# Patient Record
Sex: Male | Born: 1975 | Race: Black or African American | Hispanic: No | Marital: Single | Smoking: Current every day smoker
Health system: Southern US, Community
[De-identification: ages and names within clinical notes are randomized; demographics above are authoritative.]

## PROBLEM LIST (undated history)

## (undated) DIAGNOSIS — T7840XA Allergy, unspecified, initial encounter: Secondary | ICD-10-CM

## (undated) DIAGNOSIS — K859 Acute pancreatitis without necrosis or infection, unspecified: Secondary | ICD-10-CM

## (undated) DIAGNOSIS — I10 Essential (primary) hypertension: Secondary | ICD-10-CM

## (undated) HISTORY — DX: Allergy, unspecified, initial encounter: T78.40XA

## (undated) HISTORY — PX: NASAL FRACTURE SURGERY: SHX718

## (undated) HISTORY — PX: HERNIA REPAIR: SHX51

## (undated) HISTORY — PX: TYMPANOSTOMY TUBE PLACEMENT: SHX32

---

## 2003-02-28 ENCOUNTER — Emergency Department (HOSPITAL_COMMUNITY): Admission: EM | Admit: 2003-02-28 | Discharge: 2003-02-28 | Payer: Self-pay | Admitting: *Deleted

## 2003-09-28 ENCOUNTER — Emergency Department (HOSPITAL_COMMUNITY): Admission: EM | Admit: 2003-09-28 | Discharge: 2003-09-28 | Payer: Self-pay

## 2005-02-13 ENCOUNTER — Emergency Department (HOSPITAL_COMMUNITY): Admission: EM | Admit: 2005-02-13 | Discharge: 2005-02-13 | Payer: Self-pay | Admitting: Emergency Medicine

## 2005-02-15 ENCOUNTER — Emergency Department (HOSPITAL_COMMUNITY): Admission: EM | Admit: 2005-02-15 | Discharge: 2005-02-15 | Payer: Self-pay | Admitting: Emergency Medicine

## 2005-03-06 ENCOUNTER — Emergency Department (HOSPITAL_COMMUNITY): Admission: EM | Admit: 2005-03-06 | Discharge: 2005-03-06 | Payer: Self-pay | Admitting: Emergency Medicine

## 2005-04-23 ENCOUNTER — Emergency Department (HOSPITAL_COMMUNITY): Admission: EM | Admit: 2005-04-23 | Discharge: 2005-04-23 | Payer: Self-pay | Admitting: Emergency Medicine

## 2007-12-24 ENCOUNTER — Emergency Department (HOSPITAL_COMMUNITY): Admission: EM | Admit: 2007-12-24 | Discharge: 2007-12-24 | Payer: Self-pay | Admitting: Emergency Medicine

## 2011-07-05 ENCOUNTER — Encounter (HOSPITAL_COMMUNITY): Payer: Self-pay | Admitting: *Deleted

## 2011-07-05 ENCOUNTER — Inpatient Hospital Stay (HOSPITAL_COMMUNITY): Payer: Medicaid Other

## 2011-07-05 ENCOUNTER — Inpatient Hospital Stay (HOSPITAL_COMMUNITY)
Admission: EM | Admit: 2011-07-05 | Discharge: 2011-07-10 | DRG: 896 | Disposition: A | Discharge status: Home / Self Care | Payer: Medicaid Other | Attending: Internal Medicine | Admitting: Internal Medicine

## 2011-07-05 ENCOUNTER — Emergency Department (HOSPITAL_COMMUNITY): Payer: Medicaid Other

## 2011-07-05 DIAGNOSIS — I10 Essential (primary) hypertension: Secondary | ICD-10-CM

## 2011-07-05 DIAGNOSIS — D72829 Elevated white blood cell count, unspecified: Secondary | ICD-10-CM

## 2011-07-05 DIAGNOSIS — F10231 Alcohol dependence with withdrawal delirium: Secondary | ICD-10-CM | POA: Diagnosis present

## 2011-07-05 DIAGNOSIS — E876 Hypokalemia: Secondary | ICD-10-CM

## 2011-07-05 DIAGNOSIS — E860 Dehydration: Secondary | ICD-10-CM

## 2011-07-05 DIAGNOSIS — F10931 Alcohol use, unspecified with withdrawal delirium: Secondary | ICD-10-CM

## 2011-07-05 DIAGNOSIS — R319 Hematuria, unspecified: Secondary | ICD-10-CM | POA: Diagnosis not present

## 2011-07-05 DIAGNOSIS — R1110 Vomiting, unspecified: Secondary | ICD-10-CM

## 2011-07-05 DIAGNOSIS — I951 Orthostatic hypotension: Secondary | ICD-10-CM | POA: Diagnosis not present

## 2011-07-05 DIAGNOSIS — K859 Acute pancreatitis without necrosis or infection, unspecified: Secondary | ICD-10-CM

## 2011-07-05 DIAGNOSIS — F10929 Alcohol use, unspecified with intoxication, unspecified: Secondary | ICD-10-CM

## 2011-07-05 DIAGNOSIS — F10229 Alcohol dependence with intoxication, unspecified: Principal | ICD-10-CM | POA: Diagnosis present

## 2011-07-05 DIAGNOSIS — R066 Hiccough: Secondary | ICD-10-CM | POA: Diagnosis not present

## 2011-07-05 LAB — DIFFERENTIAL
Basophils Relative: 0 % (ref 0–1)
Eosinophils Absolute: 0 10*3/uL (ref 0.0–0.7)
Eosinophils Relative: 0 % (ref 0–5)
Eosinophils Relative: 0 % (ref 0–5)
Lymphs Abs: 1 10*3/uL (ref 0.7–4.0)
Lymphs Abs: 0.9 10*3/uL (ref 0.7–4.0)
Monocytes Absolute: 1 10*3/uL (ref 0.1–1.0)
Monocytes Absolute: 1 10*3/uL (ref 0.1–1.0)
Monocytes Relative: 6 % (ref 3–12)
Neutro Abs: 15.5 10*3/uL — ABNORMAL HIGH (ref 1.7–7.7)
Neutrophils Relative %: 86 % — ABNORMAL HIGH (ref 43–77)

## 2011-07-05 LAB — URINE MICROSCOPIC-ADD ON

## 2011-07-05 LAB — COMPREHENSIVE METABOLIC PANEL
ALT: 21 U/L (ref 0–53)
AST: 36 U/L (ref 0–37)
Albumin: 2.9 g/dL — ABNORMAL LOW (ref 3.5–5.2)
Alkaline Phosphatase: 102 U/L (ref 39–117)
BUN: 8 mg/dL (ref 6–23)
CO2: 24 mEq/L (ref 19–32)
CO2: 22 mEq/L (ref 19–32)
Calcium: 9.2 mg/dL (ref 8.4–10.5)
Chloride: 103 mEq/L (ref 96–112)
Creatinine, Ser: 0.78 mg/dL (ref 0.50–1.35)
Creatinine, Ser: 0.66 mg/dL (ref 0.50–1.35)
GFR calc Af Amer: >90 mL/min (ref >90)
GFR calc non Af Amer: >90 mL/min (ref >90)
GFR calc non Af Amer: >90 mL/min (ref >90)
Glucose, Bld: 92 mg/dL (ref 70–99)
Potassium: 4.2 mEq/L (ref 3.5–5.1)
Sodium: 137 mEq/L (ref 135–145)
Total Bilirubin: 0.6 mg/dL (ref 0.3–1.2)
Total Protein: 7 g/dL (ref 6.0–8.3)

## 2011-07-05 LAB — URINALYSIS, ROUTINE W REFLEX MICROSCOPIC
Leukocytes, UA: NEGATIVE
Nitrite: NEGATIVE
Protein, ur: >300 mg/dL — AB
Specific Gravity, Urine: 1.028 (ref 1.005–1.030)
Urobilinogen, UA: 1 mg/dL (ref 0.0–1.0)

## 2011-07-05 LAB — CBC
HCT: 49.7 % (ref 39.0–52.0)
MCH: 36.5 pg — ABNORMAL HIGH (ref 26.0–34.0)
MCHC: 36.9 g/dL — ABNORMAL HIGH (ref 30.0–36.0)
MCV: 98.9 fL (ref 78.0–100.0)
MCV: 100 fL (ref 78.0–100.0)
Platelets: 266 10*3/uL (ref 150–400)
Platelets: 218 10*3/uL (ref 150–400)
RBC: 4.97 MIL/uL (ref 4.22–5.81)
RDW: 12.5 % (ref 11.5–15.5)
WBC: 17.4 10*3/uL — ABNORMAL HIGH (ref 4.0–10.5)

## 2011-07-05 LAB — CARDIAC PANEL(CRET KIN+CKTOT+MB+TROPI)
CK, MB: 2.5 ng/mL (ref 0.3–4.0)
Relative Index: 1.4 (ref 0.0–2.5)
Total CK: 179 U/L (ref 7–232)
Troponin I: <0.3 ng/mL (ref <0.30)

## 2011-07-05 LAB — PHOSPHORUS: Phosphorus: 3.5 mg/dL (ref 2.3–4.6)

## 2011-07-05 LAB — MRSA PCR SCREENING: MRSA by PCR: NEGATIVE

## 2011-07-05 LAB — PROTIME-INR: INR: 1.13 (ref 0.00–1.49)

## 2011-07-05 LAB — RAPID URINE DRUG SCREEN, HOSP PERFORMED: Barbiturates: NOT DETECTED

## 2011-07-05 MED ORDER — LABETALOL HCL 200 MG PO TABS
200.0000 mg | ORAL_TABLET | Freq: Two times a day (BID) | ORAL | Status: DC
  Administered 2011-07-06 – 2011-07-10 (×8): 200 mg via ORAL
  Filled 2011-07-05 (×11): qty 1

## 2011-07-05 MED ORDER — ENOXAPARIN SODIUM 30 MG/0.3ML ~~LOC~~ SOLN
30.0000 mg | SUBCUTANEOUS | Status: DC
  Administered 2011-07-05: 30 mg via SUBCUTANEOUS
  Filled 2011-07-05 (×2): qty 0.3

## 2011-07-05 MED ORDER — ADULT MULTIVITAMIN W/MINERALS CH
1.0000 | ORAL_TABLET | Freq: Every day | ORAL | Status: DC
  Administered 2011-07-07 – 2011-07-10 (×4): 1 via ORAL
  Filled 2011-07-05 (×6): qty 1

## 2011-07-05 MED ORDER — VITAMIN B-1 100 MG PO TABS
100.0000 mg | ORAL_TABLET | Freq: Every day | ORAL | Status: DC
  Administered 2011-07-07 – 2011-07-09 (×3): 100 mg via ORAL
  Filled 2011-07-05 (×6): qty 1

## 2011-07-05 MED ORDER — OXYCODONE HCL 5 MG PO TABS
5.0000 mg | ORAL_TABLET | ORAL | Status: DC | PRN
  Administered 2011-07-07 – 2011-07-10 (×10): 5 mg via ORAL
  Filled 2011-07-05 (×11): qty 1

## 2011-07-05 MED ORDER — ONDANSETRON HCL 4 MG/2ML IJ SOLN
4.0000 mg | INTRAMUSCULAR | Status: DC | PRN
  Filled 2011-07-05: qty 2

## 2011-07-05 MED ORDER — MORPHINE SULFATE 2 MG/ML IJ SOLN
1.0000 mg | INTRAMUSCULAR | Status: DC | PRN
  Administered 2011-07-06 – 2011-07-07 (×6): 1 mg via INTRAVENOUS
  Filled 2011-07-05 (×6): qty 1

## 2011-07-05 MED ORDER — SODIUM CHLORIDE 0.9 % IV SOLN
INTRAVENOUS | Status: DC
  Administered 2011-07-05 (×2): via INTRAVENOUS

## 2011-07-05 MED ORDER — SODIUM CHLORIDE 0.9 % IV SOLN
INTRAVENOUS | Status: DC
  Administered 2011-07-05: 21:00:00 via INTRAVENOUS
  Administered 2011-07-06 (×2): 1000 mL via INTRAVENOUS
  Administered 2011-07-07 – 2011-07-08 (×3): via INTRAVENOUS

## 2011-07-05 MED ORDER — LORAZEPAM 1 MG PO TABS
1.0000 mg | ORAL_TABLET | Freq: Four times a day (QID) | ORAL | Status: AC | PRN
  Administered 2011-07-08: 1 mg via ORAL
  Filled 2011-07-05: qty 1

## 2011-07-05 MED ORDER — MORPHINE SULFATE 4 MG/ML IJ SOLN
4.0000 mg | INTRAMUSCULAR | Status: AC | PRN
  Administered 2011-07-05 (×2): 4 mg via INTRAVENOUS
  Filled 2011-07-05 (×2): qty 1

## 2011-07-05 MED ORDER — FAMOTIDINE IN NACL 20-0.9 MG/50ML-% IV SOLN
20.0000 mg | Freq: Once | INTRAVENOUS | Status: AC
  Administered 2011-07-05: 20 mg via INTRAVENOUS
  Filled 2011-07-05: qty 50

## 2011-07-05 MED ORDER — SODIUM CHLORIDE 0.9 % IJ SOLN
3.0000 mL | Freq: Two times a day (BID) | INTRAMUSCULAR | Status: DC
  Administered 2011-07-07 – 2011-07-10 (×4): 3 mL via INTRAVENOUS

## 2011-07-05 MED ORDER — ONDANSETRON HCL 4 MG PO TABS: 4.0000 mg | ORAL_TABLET | Freq: Four times a day (QID) | ORAL | Status: DC | PRN

## 2011-07-05 MED ORDER — LORAZEPAM 2 MG/ML IJ SOLN
1.0000 mg | Freq: Four times a day (QID) | INTRAMUSCULAR | Status: AC | PRN
  Administered 2011-07-05 – 2011-07-07 (×5): 1 mg via INTRAVENOUS
  Filled 2011-07-05 (×6): qty 1

## 2011-07-05 MED ORDER — THIAMINE HCL 100 MG/ML IJ SOLN
100.0000 mg | Freq: Every day | INTRAMUSCULAR | Status: DC
  Administered 2011-07-05 – 2011-07-10 (×3): 100 mg via INTRAVENOUS
  Filled 2011-07-05 (×6): qty 1

## 2011-07-05 MED ORDER — ONDANSETRON HCL 4 MG/2ML IJ SOLN
4.0000 mg | INTRAMUSCULAR | Status: AC | PRN
  Administered 2011-07-05 (×2): 4 mg via INTRAVENOUS
  Filled 2011-07-05 (×2): qty 2

## 2011-07-05 MED ORDER — ONDANSETRON HCL 4 MG/2ML IJ SOLN
4.0000 mg | Freq: Four times a day (QID) | INTRAMUSCULAR | Status: DC | PRN
  Administered 2011-07-06: 4 mg via INTRAVENOUS

## 2011-07-05 MED ORDER — FOLIC ACID 1 MG PO TABS
1.0000 mg | ORAL_TABLET | Freq: Every day | ORAL | Status: DC
  Administered 2011-07-07 – 2011-07-10 (×4): 1 mg via ORAL
  Filled 2011-07-05 (×6): qty 1

## 2011-07-05 NOTE — ED Provider Notes (Signed)
History     CSN: [id number]  Arrival date & time 07/05/11  1525   First MD Initiated Contact with Patient 07/05/11 1536      Chief Complaint  Patient presents with  . Abdominal Pain    umbilical area  . Nausea    HPI Pt was seen at 1550.  Per pt, c/o gradual onset and worsening of persistent upper abd "pain" since yesterday.  Describes the pain as radiating into his back, has been associated with several intermittent episodes of N/V.  Endorses hx of daily etoh use.  Denies diarrhea, no fevers, no rash, no black or blood in stools or emesis, no CP/SOB.    History reviewed. No pertinent past medical history.  History reviewed. No pertinent past surgical history.   History  Substance Use Topics  . Smoking status: Current Everyday Smoker -- 1.0 packs/day    Types: Cigarettes  . Smokeless tobacco: Never Used  . Alcohol Use: 0.0 oz/week    12-24 Shots of liquor per week     daily    Review of Systems ROS: Statement: All systems negative except as marked or noted in the HPI; Constitutional: Negative for fever and chills. ; ; Eyes: Negative for eye pain, redness and discharge. ; ; ENMT: Negative for ear pain, hoarseness, nasal congestion, sinus pressure and sore throat. ; ; Cardiovascular: Negative for chest pain, palpitations, diaphoresis, dyspnea and peripheral edema. ; ; Respiratory: Negative for cough, wheezing and stridor. ; ; Gastrointestinal: +N/V, abd pain.  Negative for diarrhea, blood in stool, hematemesis, jaundice and rectal bleeding. . ; ; Genitourinary: Negative for dysuria, flank pain and hematuria. ; ; Musculoskeletal: Negative for back pain and neck pain. Negative for swelling and trauma.; ; Skin: Negative for pruritus, rash, abrasions, blisters, bruising and skin lesion.; ; Neuro: Negative for headache, lightheadedness and neck stiffness. Negative for weakness, altered level of consciousness , altered mental status, extremity weakness, paresthesias, involuntary movement,  seizure and syncope.      Allergies  Review of patient's allergies indicates no known allergies.  Home Medications  No current outpatient prescriptions on file.  BP 125/103  Pulse 106  Temp(Src) 98 F (36.7 C) (Oral)  Resp 18  Ht 5\' 9"  (1.753 m)  Wt 125 lb (56.7 kg)  BMI 18.46 kg/m2  SpO2 98%  Physical Exam 1555: Physical examination:  Nursing notes reviewed; Vital signs and O2 SAT reviewed;  Constitutional: Thin, Uncomfortable appearing; Head:  Normocephalic, atraumatic; Eyes: EOMI, PERRL, No scleral icterus; ENMT: Mouth and pharynx normal, Mucous membranes dry; Neck: Supple, Full range of motion, No lymphadenopathy; Cardiovascular: Regular rate and rhythm, No murmur, rub, or gallop; Respiratory: Breath sounds clear & equal bilaterally, No rales, rhonchi, wheezes, or rub, Normal respiratory effort/excursion; Chest: Nontender, Movement normal; Abdomen: Soft, +mid-epigastric area tender to palp, no rebound or guarding, Nondistended, Normal bowel sounds; Extremities: Pulses normal, No tenderness, No edema, No calf edema or asymmetry.; Neuro: AA&Ox3, Major CN grossly intact.  No gross focal motor or sensory deficits in extremities.; Skin: Color normal, Warm, Dry    ED Course  Procedures   MDM  MDM Reviewed: nursing note and vitals Interpretation: labs and x-ray   Results for orders placed during the hospital encounter of 07/05/11  CBC      Component Value Range   WBC 14.6 (*) 4.0 - 10.5 (K/uL)   RBC 5.34  4.22 - 5.81 (MIL/uL)   Hemoglobin 19.5 (*) 13.0 - 17.0 (g/dL)   HCT 40.9 (*) 81.1 - 52.0 (%)  MCV 98.9  78.0 - 100.0 (fL)   MCH 36.5 (*) 26.0 - 34.0 (pg)   MCHC 36.9 (*) 30.0 - 36.0 (g/dL)   RDW 16.1  09.6 - 04.5 (%)   Platelets 266  150 - 400 (K/uL)  DIFFERENTIAL      Component Value Range   Neutrophils Relative 86 (*) 43 - 77 (%)   Lymphocytes Relative 7 (*) 12 - 46 (%)   Monocytes Relative 7  3 - 12 (%)   Eosinophils Relative 0  0 - 5 (%)   Basophils Relative 0   0 - 1 (%)   Neutro Abs 12.6 (*) 1.7 - 7.7 (K/uL)   Lymphs Abs 1.0  0.7 - 4.0 (K/uL)   Monocytes Absolute 1.0  0.1 - 1.0 (K/uL)   Eosinophils Absolute 0.0  0.0 - 0.7 (K/uL)   Basophils Absolute 0.0  0.0 - 0.1 (K/uL)   Smear Review MORPHOLOGY UNREMARKABLE    COMPREHENSIVE METABOLIC PANEL      Component Value Range   Sodium 137  135 - 145 (mEq/L)   Potassium 3.8  3.5 - 5.1 (mEq/L)   Chloride 99  96 - 112 (mEq/L)   CO2 24  19 - 32 (mEq/L)   Glucose, Bld 92  70 - 99 (mg/dL)   BUN 7  6 - 23 (mg/dL)   Creatinine, Ser 4.09  0.50 - 1.35 (mg/dL)   Calcium 9.2  8.4 - 81.1 (mg/dL)   Total Protein 7.0  6.0 - 8.3 (g/dL)   Albumin 3.3 (*) 3.5 - 5.2 (g/dL)   AST 32  0 - 37 (U/L)   ALT 21  0 - 53 (U/L)   Alkaline Phosphatase 110  39 - 117 (U/L)   Total Bilirubin 0.6  0.3 - 1.2 (mg/dL)   GFR calc non Af Amer >90  >90 (mL/min)   GFR calc Af Amer >90  >90 (mL/min)  LIPASE, BLOOD      Component Value Range   Lipase 2954 (*) 11 - 59 (U/L)  URINALYSIS, ROUTINE W REFLEX MICROSCOPIC      Component Value Range   Color, Urine AMBER (*) YELLOW    APPearance CLOUDY (*) CLEAR    Specific Gravity, Urine 1.028  1.005 - 1.030    pH 6.0  5.0 - 8.0    Glucose, UA NEGATIVE  NEGATIVE (mg/dL)   Hgb urine dipstick SMALL (*) NEGATIVE    Bilirubin Urine NEGATIVE  NEGATIVE    Ketones, ur 15 (*) NEGATIVE (mg/dL)   Protein, ur >914 (*) NEGATIVE (mg/dL)   Urobilinogen, UA 1.0  0.0 - 1.0 (mg/dL)   Nitrite NEGATIVE  NEGATIVE    Leukocytes, UA NEGATIVE  NEGATIVE   ETHANOL      Component Value Range   Alcohol, Ethyl (B) 103 (*) 0 - 11 (mg/dL)  URINE MICROSCOPIC-ADD ON      Component Value Range   Squamous Epithelial / LPF RARE  RARE    WBC, UA 0-2  <3 (WBC/hpf)   RBC / HPF 0-2  <3 (RBC/hpf)   Bacteria, UA RARE  RARE    Casts HYALINE CASTS (*) NEGATIVE    Sperm, UA PRESENT     Urine-Other MUCOUS PRESENT     Dg Abd Acute W/chest 07/05/2011  *RADIOLOGY REPORT*  Clinical Data: Abdominal pain, nausea/vomiting,  cough  ACUTE ABDOMEN SERIES (ABDOMEN 2 VIEW & CHEST 1 VIEW)  Comparison: None.  Findings: Lungs are clear. No pleural effusion or pneumothorax.  Cardiomediastinal silhouette is within normal limits.  Nonobstructive bowel gas pattern.  No evidence of free air under the diaphragm on the upright view.  Visualized osseous structures are within normal limits.  IMPRESSION: No evidence of acute cardiopulmonary disease.  No evidence of small bowel obstruction or free air.  Original Report Authenticated By: Charline Bills, M.D.      [phone]:  Pt with pancreatitis, likely from daily etoh use.  Does endorse that he "gets shaky" if he does not drink etoh.  Does not appear to have tremor or in withdrawal currently.  Requesting another dose of pain and nausea meds.  Dx testing d/w pt.  Questions answered.  Verb understanding, agreeable to admit.  T/C to [facility] Dr. Elisabeth Pigeon, case discussed, including:  HPI, pertinent PM/SHx, VS/PE, dx testing, ED course and treatment:  Agreeable to admit, requests to obtain step down bed to team 1.        Laray Anger, DO 07/07/11 1258

## 2011-07-05 NOTE — Progress Notes (Signed)
Received report from ED RN and accepted pt to ICU 1224. CHG bath given in ICU. C/O nausea, received zofran iv and morphine iv at 1820 per report. [facility]Link notified and report given to night RN.

## 2011-07-05 NOTE — H&P (Signed)
PCP:  Provider Not In System   DOA:  07/05/2011  3:25 PM  Chief Complaint:  Alcohol intoxication  HPI: 36 year old male with history of alcohol abuse who presented to ED with complaints of sever abdominal pain in epigastric and periumbilical region, 8-9/10 in intensity, radiating to back, constant. Patient reports associated nausea and vomiting, non bloody. No complaints of constipation or diarrhea, no blood in stool or urine. No complaints of shortness of breath, no chest pain, no fever or chills at home. No dizziness or lightheadedness.  Assessment/Plan  Principal Problem:   *Acute alcohol intoxication - start CIWA protocol - continue IV fluids - keep NPO - check UDS - monitor for signs of withdrawal  Active Problems:   Acute pancreatitis - secondary to alcohol - obtain CT abdome/pelvis with contrast - zofran PRN for nausea and/or vomiting - keep NPO - continue IV fluids NS at 125 cc /hr - analgesia with morphine IV PRN - trend lipase level   Leukocytosis - likely secondary to pancreatitis - I will not initiate any antibiotics as patient is afebrile and only a mild leukocytosis - urinalysis although cloudy in appearance it does not have any leukocytes or bacteria identified - follow up CBC in am - follow up blood culture results - follow up urine culture results   HTN (hypertension) - we will start labetalol 200 mg BID PO - follow up UDS result - telemetry monitoring  DVT Prophylaxis - lovenox sub Q  Code Status - full code  Education  - test results and diagnostic studies were discussed with patient - questions were answered at the bedside and contact information was provided for additional questions or concerns   Allergies: No Known Allergies  Prior to Admission medications   Not on File    History reviewed. No pertinent past medical history. History reviewed. No pertinent past surgical history.  Social History: Reports that he has been smoking  Cigarettes.  He has been smoking about 1 pack per day. He has never used smokeless tobacco. He reports that he drinks alcohol. He reports that he does not use illicit drugs. History reviewed. No pertinent family history.  Review of Systems:  Constitutional: Denies fever, chills, diaphoresis, appetite change and fatigue.  HEENT: Denies photophobia, eye pain, redness, hearing loss, ear pain, congestion, sore throat, rhinorrhea, sneezing, mouth sores, trouble swallowing, neck pain, neck stiffness and tinnitus.   Respiratory: Denies SOB, DOE, cough, chest tightness,  and wheezing.   Cardiovascular: Denies chest pain, palpitations and leg swelling.  Gastrointestinal: Denies nausea, vomiting, abdominal pain, diarrhea, constipation, blood in stool and abdominal distention.  Genitourinary: Denies dysuria, urgency, frequency, hematuria, flank pain and difficulty urinating.  Musculoskeletal: Denies myalgias, back pain, joint swelling, arthralgias and gait problem.  Skin: Denies pallor, rash and wound.  Neurological: Denies dizziness, seizures, syncope, weakness, light-headedness, numbness and headaches.  Hematological: Denies adenopathy. Easy bruising, personal or family bleeding history  Psychiatric/Behavioral: Denies suicidal ideation, mood changes, confusion, nervousness, sleep disturbance and agitation  Physical Exam:  Filed Vitals:   07/05/11 1527 07/05/11 1532  BP: 125/103   Pulse: 106   Temp: 98 F (36.7 C)   TempSrc: Oral   Resp: 18   Height: 5\' 9"  (1.753 m)   Weight: 56.7 kg (125 lb) 56.7 kg (125 lb)  SpO2: 98%     Constitutional: Vital signs reviewed.  Patient is in no acute distress and cooperative with exam.  Head: Normocephalic and atraumatic Ear: TM normal bilaterally Mouth: no erythema or  exudates, MMM Eyes: PERRL, EOMI, conjunctivae normal, No scleral icterus.  Neck: Supple, Trachea midline normal ROM, No JVD, mass, thyromegaly, or carotid bruit present.  Cardiovascular:  RRR, S1 normal, S2 normal, no MRG, pulses symmetric and intact bilaterally Pulmonary/Chest: CTAB, no wheezes, rales, or rhonchi Abdominal: Soft. Non-tender, non-distended, bowel sounds are normal, no masses, organomegaly, or guarding present.  GU: no CVA tenderness Musculoskeletal: No joint deformities, erythema, or stiffness, ROM full and no nontender Ext: no edema and no cyanosis, pulses palpable bilaterally (DP and PT) Hematology: no cervical, inginal, or axillary adenopathy.  Neurological: A&O x3, Strenght is normal and symmetric bilaterally, cranial nerve II-XII are grossly intact, no focal motor deficit, sensory intact to light touch bilaterally.  Skin: Warm, dry and intact. No rash, cyanosis, or clubbing.  Psychiatric: Normal mood and affect. speech and behavior is normal.  Cognition and memory are normal.   Labs on Admission:  URINALYSIS, ROUTINE W REFLEX MICROSCOPIC     Status: Abnormal   Collection Time   07/05/11  3:55 PM      Component Value Range Comment   Color, Urine AMBER (*) YELLOW     APPearance CLOUDY (*) CLEAR     Specific Gravity, Urine 1.028  1.005 - 1.030     pH 6.0  5.0 - 8.0     Glucose, UA NEGATIVE  NEGATIVE (mg/dL)    Hgb urine dipstick SMALL (*) NEGATIVE     Bilirubin Urine NEGATIVE  NEGATIVE     Ketones, ur 15 (*) NEGATIVE (mg/dL)    Protein, ur >161 (*) NEGATIVE (mg/dL)    Urobilinogen, UA 1.0  0.0 - 1.0 (mg/dL)    Nitrite NEGATIVE  NEGATIVE     Leukocytes, UA NEGATIVE  NEGATIVE    URINE MICROSCOPIC-ADD ON     Status: Abnormal   Collection Time   07/05/11  3:55 PM      Component Value Range Comment   Squamous Epithelial / LPF RARE  RARE     WBC, UA 0-2  <3 (WBC/hpf)    RBC / HPF 0-2  <3 (RBC/hpf)    Bacteria, UA RARE  RARE     Casts HYALINE CASTS (*) NEGATIVE     Sperm, UA PRESENT      Urine-Other MUCOUS PRESENT     CBC     Status: Abnormal   Collection Time   07/05/11  4:09 PM      Component Value Range Comment   WBC 14.6 (*) 4.0 - 10.5 (K/uL)      RBC 5.34  4.22 - 5.81 (MIL/uL)    Hemoglobin 19.5 (*) 13.0 - 17.0 (g/dL)    HCT 09.6 (*) 04.5 - 52.0 (%)    MCV 98.9  78.0 - 100.0 (fL)    MCH 36.5 (*) 26.0 - 34.0 (pg)    MCHC 36.9 (*) 30.0 - 36.0 (g/dL)    RDW 40.9  81.1 - 91.4 (%)    Platelets 266  150 - 400 (K/uL)   COMPREHENSIVE METABOLIC PANEL     Status: Abnormal   Collection Time   07/05/11  4:09 PM      Component Value Range Comment   Sodium 137  135 - 145 (mEq/L)    Potassium 3.8  3.5 - 5.1 (mEq/L)    Chloride 99  96 - 112 (mEq/L)    CO2 24  19 - 32 (mEq/L)    Glucose, Bld 92  70 - 99 (mg/dL)    BUN 7  6 - 23 (  mg/dL)    Creatinine, Ser 1.02  0.50 - 1.35 (mg/dL)    Calcium 9.2  8.4 - 10.5 (mg/dL)    Total Protein 7.0  6.0 - 8.3 (g/dL)    Albumin 3.3 (*) 3.5 - 5.2 (g/dL)    AST 32  0 - 37 (U/L)    ALT 21  0 - 53 (U/L)    Alkaline Phosphatase 110  39 - 117 (U/L)    Total Bilirubin 0.6  0.3 - 1.2 (mg/dL)    GFR calc non Af Amer >90  >90 (mL/min)    GFR calc Af Amer >90  >90 (mL/min)   LIPASE, BLOOD     Status: Abnormal   Collection Time   07/05/11  4:09 PM      Component Value Range Comment   Lipase 2954 (*) 11 - 59 (U/L)   ETHANOL     Status: Abnormal   Collection Time   07/05/11  4:09 PM      Component Value Range Comment   Alcohol, Ethyl (B) 103 (*) 0 - 11 (mg/dL)     Radiological Exams on Admission: No results found. Time Spent on Admission: Over 30 minutes  Timouthy Gilardi 07/05/2011, 5:46 PM  [facility] Pager # [phone] Main Office # [phone]

## 2011-07-05 NOTE — ED Notes (Signed)
Pt denies any symptoms of withdraws.  Pt alert and active with care

## 2011-07-05 NOTE — ED Notes (Signed)
Updated EDP of pt's CIWA score (6), no new orders

## 2011-07-05 NOTE — ED Notes (Signed)
Pt from home with reports of abdominal pain around the umbilical area that radiates to back as well as nausea. Pt denies injury, vomiting or fever.

## 2011-07-06 ENCOUNTER — Inpatient Hospital Stay (HOSPITAL_COMMUNITY): Payer: Medicaid Other

## 2011-07-06 DIAGNOSIS — R1110 Vomiting, unspecified: Secondary | ICD-10-CM | POA: Diagnosis present

## 2011-07-06 DIAGNOSIS — E860 Dehydration: Secondary | ICD-10-CM | POA: Diagnosis present

## 2011-07-06 DIAGNOSIS — F10231 Alcohol dependence with withdrawal delirium: Secondary | ICD-10-CM | POA: Diagnosis present

## 2011-07-06 LAB — COMPREHENSIVE METABOLIC PANEL
ALT: 19 U/L (ref 0–53)
AST: 52 U/L — ABNORMAL HIGH (ref 0–37)
Alkaline Phosphatase: 103 U/L (ref 39–117)
CO2: 26 mEq/L (ref 19–32)
Calcium: 9 mg/dL (ref 8.4–10.5)
Chloride: 102 mEq/L (ref 96–112)
GFR calc non Af Amer: >90 mL/min (ref >90)
Glucose, Bld: 117 mg/dL — ABNORMAL HIGH (ref 70–99)
Potassium: 4.4 mEq/L (ref 3.5–5.1)
Sodium: 139 mEq/L (ref 135–145)

## 2011-07-06 LAB — CARDIAC PANEL(CRET KIN+CKTOT+MB+TROPI)
Relative Index: 1.1 (ref 0.0–2.5)
Total CK: 253 U/L — ABNORMAL HIGH (ref 7–232)
Troponin I: <0.3 ng/mL (ref <0.30)

## 2011-07-06 LAB — CBC
Hemoglobin: 18.3 g/dL — ABNORMAL HIGH (ref 13.0–17.0)
MCH: 36.4 pg — ABNORMAL HIGH (ref 26.0–34.0)
MCHC: 36 g/dL (ref 30.0–36.0)
RDW: 12.6 % (ref 11.5–15.5)

## 2011-07-06 LAB — HEMOGLOBIN A1C: Mean Plasma Glucose: 105 mg/dL (ref <117)

## 2011-07-06 LAB — URINE CULTURE

## 2011-07-06 LAB — TSH: TSH: 1.12 u[IU]/mL (ref 0.350–4.500)

## 2011-07-06 LAB — GLUCOSE, CAPILLARY

## 2011-07-06 LAB — LIPASE, BLOOD: Lipase: 2693 U/L — ABNORMAL HIGH (ref 11–59)

## 2011-07-06 MED ORDER — ONDANSETRON HCL 4 MG/2ML IJ SOLN
4.0000 mg | INTRAMUSCULAR | Status: DC | PRN
  Administered 2011-07-06 – 2011-07-10 (×8): 4 mg via INTRAVENOUS
  Filled 2011-07-06 (×8): qty 2

## 2011-07-06 MED ORDER — SODIUM CHLORIDE 0.9 % IV SOLN
500.0000 mg | Freq: Three times a day (TID) | INTRAVENOUS | Status: DC
  Administered 2011-07-06 – 2011-07-07 (×4): 500 mg via INTRAVENOUS
  Filled 2011-07-06 (×7): qty 500

## 2011-07-06 MED ORDER — PROMETHAZINE HCL 25 MG/ML IJ SOLN
12.5000 mg | Freq: Four times a day (QID) | INTRAMUSCULAR | Status: DC | PRN
  Administered 2011-07-06: 12.5 mg via INTRAVENOUS
  Filled 2011-07-06: qty 1

## 2011-07-06 MED ORDER — HYDRALAZINE HCL 20 MG/ML IJ SOLN
5.0000 mg | Freq: Three times a day (TID) | INTRAMUSCULAR | Status: DC | PRN
  Administered 2011-07-06: 5 mg via INTRAVENOUS
  Filled 2011-07-06: qty 1

## 2011-07-06 MED ORDER — ENOXAPARIN SODIUM 40 MG/0.4ML ~~LOC~~ SOLN
40.0000 mg | SUBCUTANEOUS | Status: DC
  Administered 2011-07-06 – 2011-07-09 (×4): 40 mg via SUBCUTANEOUS
  Filled 2011-07-06 (×5): qty 0.4

## 2011-07-06 MED ORDER — IOHEXOL 300 MG/ML  SOLN
80.0000 mL | Freq: Once | INTRAMUSCULAR | Status: AC | PRN
  Administered 2011-07-06: 80 mL via INTRAVENOUS

## 2011-07-06 MED ORDER — ONDANSETRON HCL 4 MG PO TABS: 4.0000 mg | ORAL_TABLET | ORAL | Status: DC | PRN

## 2011-07-06 MED ORDER — VITAMINS A & D EX OINT
TOPICAL_OINTMENT | CUTANEOUS | Status: AC
  Administered 2011-07-06: 13:00:00
  Filled 2011-07-06: qty 5

## 2011-07-06 NOTE — Progress Notes (Signed)
Subjective: Patient is fully awake and alert. He states that his pain is diffuse in his abdomen and he has no real significant epigastric pain. The patient however is unable to tolerate any oral intake and continues to have nausea and vomiting. The patient endorses that he drinks approximately 1 pint of liquor daily his last drink was yesterday but he drank one cup of liquor. Objective: Filed Vitals:   07/06/11 0300 07/06/11 0400 07/06/11 0700 07/06/11 0800  BP: 156/101 146/117 177/117 163/98  Pulse: 73 84 78 77  Temp:      TempSrc:      Resp: 23 23 19 19   Height:      Weight:      SpO2: 98% 96% 97% 97%   Weight change:   Intake/Output Summary (Last 24 hours) at 07/06/11 0828 Last data filed at 07/06/11 0800  Gross per 24 hour  Intake   2590 ml  Output     60 ml  Net   2530 ml    General: Alert, awake, oriented x3, in no acute distress. He is very thin appearing. However he has very logical thought process and does not appear agitated in presentation HEENT: [place]/AT PEERL, EOMI Neck: Trachea midline,  no masses, no thyromegal,y no JVD, no carotid bruit OROPHARYNX:  Moist, No exudate/ erythema/lesions.  Heart: Regular rate and rhythm, without murmurs, rubs, gallops, PMI non-displaced, no heaves or thrills on palpation.  Lungs: Clear to auscultation, no wheezing or rhonchi noted. No increased vocal fremitus resonant to percussion  Abdomen: Soft, mild diffuse tenderness, nondistended, positive bowel sounds, no masses no hepatosplenomegaly noted. Neuro: No focal neurological deficits noted cranial nerves II through XII grossly intact. DTRs 2+ bilaterally upper and lower extremities. Strength functional in bilateral upper and lower extremities. Musculoskeletal: No warm swelling or erythema around joints, no spinal tenderness noted. Clubbing of fingernails noted bilateral Psychiatric: Patient alert and oriented x3, good insight and cognition, good recent to remote recall. Patient is very  logical and ordered in his thought process      Lab Results:  [facility] 07/06/11 0407 07/05/11 2030  NA 139 138  K 4.4 4.2  CL 102 103  CO2 26 22  GLUCOSE 117* 106*  BUN 12 8  CREATININE 0.74 0.66  CALCIUM 9.0 8.6  MG -- 1.5  PHOS -- 3.5    Basename 07/06/11 0407 07/05/11 2030  AST 52* 36  ALT 19 19  ALKPHOS 103 102  BILITOT 0.8 0.6  PROT 6.6 6.3  ALBUMIN 2.9* 2.9*    Basename 07/06/11 0407 07/05/11 1609  LIPASE 2693* 2954*  AMYLASE -- --    Basename 07/06/11 0407 07/05/11 2030 07/05/11 1609  WBC 17.6* 17.4* --  NEUTROABS -- 15.5* 12.6*  HGB 18.3* 18.1* --  HCT 50.9 49.7 --  MCV 101.2* 100.0 --  PLT 205 218 --    Basename 07/06/11 0740 07/05/11 2030  CKTOTAL 178 179  CKMB 1.8 2.5  CKMBINDEX -- --  TROPONINI <0.30 <0.30   No components found with this basename: POCBNP:3 No results found for this basename: DDIMER:2 in the last 72 hours  Basename 07/05/11 2030  HGBA1C 5.3   No results found for this basename: CHOL:2,HDL:2,LDLCALC:2,TRIG:2,CHOLHDL:2,LDLDIRECT:2 in the last 72 hours  Basename 07/05/11 2030  TSH 1.120  T4TOTAL --  T3FREE --  THYROIDAB --   No results found for this basename: VITAMINB12:2,FOLATE:2,FERRITIN:2,TIBC:2,IRON:2,RETICCTPCT:2 in the last 72 hours  Micro Results: Recent Results (from the past 240 hour(s))  MRSA PCR SCREENING  Status: Normal   Collection Time   07/05/11 10:27 PM      Component Value Range Status Comment   MRSA by PCR NEGATIVE  NEGATIVE  Final     Studies/Results: Dg Abd Acute W/chest  07/05/2011  *RADIOLOGY REPORT*  Clinical Data: Abdominal pain, nausea/vomiting, cough  ACUTE ABDOMEN SERIES (ABDOMEN 2 VIEW & CHEST 1 VIEW)  Comparison: None.  Findings: Lungs are clear. No pleural effusion or pneumothorax.  Cardiomediastinal silhouette is within normal limits.  Nonobstructive bowel gas pattern.  No evidence of free air under the diaphragm on the upright view.  Visualized osseous structures are within normal  limits.  IMPRESSION: No evidence of acute cardiopulmonary disease.  No evidence of small bowel obstruction or free air.  Original Report Authenticated By: Charline Bills, M.D.    Medications: I have reviewed the patient's current medications. Scheduled Meds:   . enoxaparin  30 mg Subcutaneous Q24H  . famotidine  20 mg Intravenous Once  . folic acid  1 mg Oral Daily  . labetalol  200 mg Oral BID  . mulitivitamin with minerals  1 tablet Oral Daily  . sodium chloride  3 mL Intravenous Q12H  . thiamine  100 mg Oral Daily   Or  . thiamine  100 mg Intravenous Daily   Continuous Infusions:   . sodium chloride 125 mL/hr at 07/05/11 1817  . sodium chloride 1,000 mL (07/06/11 0643)   PRN Meds:.LORazepam, LORazepam, morphine, morphine injection, ondansetron, ondansetron (ZOFRAN) IV, ondansetron, oxyCODONE, DISCONTD: ondansetron (ZOFRAN) IV, DISCONTD: ondansetron, DISCONTD: ondansetron Assessment/Plan: Patient Active Hospital Problem List: Acute alcohol intoxication (07/05/2011)   Assessment: The patient admits to alcohol ingestion. Presently is not intoxicated and has not shown signs of withdrawal. I suspect the patient probably had gastritis associated with his alcohol use.   Plan: Continue to observe the patient for alcohol withdrawal.  Leukocytosis (07/05/2011)   Assessment: In light of his markedly elevated lipase and WBC's. I will pro phylactically begin antibiotics on this patient until he is able to obtain CT of abdomen to further assess severity of his disease given the high mortality associated with infection in acute panceatitis. Caveat is that patient would be at risk for resistant organisms and fungal disease with prolonged antibiotics in undocumented infection.    HTN (hypertension) (07/05/2011)   Assessment: We'll treat with IV agents until the patient is able to tolerate oral intake    Acute pancreatitis (07/05/2011)   Assessment: The patient's lipase supports the diagnosis of  acute pancreatitis. We'll continue with bowel rest and IV support. The patient is awaiting CT of the abdomen to assess however based on the patient's clinical presentation I doubt that the patient has a pseudocyst.    Alcohol withdrawal delirium (07/06/2011)   Assessment: The patient is on the alcohol withdrawal protocol however presently is not showing any signs of active withdrawal delirium.   Vomiting (07/06/2011)   Assessment: The patient has exceeded his Zofran for control of his nausea and vomiting I will add a dose of Phenergan and observe. Given the patient's risk of going into alcohol withdrawal and be very cautious with the use of Phenergan which is also respiratory depressants.    Dehydration (07/06/2011)   Assessment: Continue IV fluid support    Hematuria (07/06/2011)   Assessment: The patient has some aching of his urine in the Foley catheter. This is likely secondary to Foley trauma. Avastin is to flush the catheter and to report any further hematuria. Presently the patient's hemoglobin is  stable.   LOS: 1 day

## 2011-07-07 LAB — COMPREHENSIVE METABOLIC PANEL
AST: 59 U/L — ABNORMAL HIGH (ref 0–37)
Albumin: 2.2 g/dL — ABNORMAL LOW (ref 3.5–5.2)
Alkaline Phosphatase: 88 U/L (ref 39–117)
BUN: 19 mg/dL (ref 6–23)
CO2: 26 mEq/L (ref 19–32)
Chloride: 105 mEq/L (ref 96–112)
Creatinine, Ser: 0.8 mg/dL (ref 0.50–1.35)
GFR calc non Af Amer: >90 mL/min (ref >90)
Potassium: 3.6 mEq/L (ref 3.5–5.1)
Total Bilirubin: 0.7 mg/dL (ref 0.3–1.2)

## 2011-07-07 LAB — LIPASE, BLOOD: Lipase: 762 U/L — ABNORMAL HIGH (ref 11–59)

## 2011-07-07 MED ORDER — PNEUMOCOCCAL VAC POLYVALENT 25 MCG/0.5ML IJ INJ
0.5000 mL | INJECTION | Freq: Once | INTRAMUSCULAR | Status: DC
  Filled 2011-07-07: qty 0.5

## 2011-07-07 NOTE — Progress Notes (Signed)
CSW met with patient. Patient is alert and oriented x3. Patient states that he drinks daily. He reports that he drinks a bottle of liquor a day. Discussed resources and needs. Patient open to receiving information on outpatient treatment and AA. States that he has never been involved in outpatient treatment before. Patient states that he has applied for unemployment but hasnt heard back yet. CSW discussed applying for medicaid for patient. Patient received information on that. CSW provided resources above. CSW to continue to follow for needs.  Kissy Cielo C. Jamila Slatten MSW, LCSW [phone]

## 2011-07-07 NOTE — Progress Notes (Signed)
   CARE MANAGEMENT NOTE 07/07/2011  Patient:  Zachary Harper, Zachary Harper   Account Number:  [id number]  Date Initiated:  07/07/2011  Documentation initiated by:  Lanier Clam  Subjective/Objective Assessment:   ADMITTED W/ETOH INTOXICATION.     Action/Plan:   FROM HOME   Anticipated DC Date:  07/10/2011   Anticipated DC Plan:  HOME/SELF CARE  In-house referral  Clinical Social Worker         Choice offered to / List presented to:             Status of service:  In process, will continue to follow Medicare Important Message given?   (If response is "NO", the following Medicare IM given date fields will be blank) Date Medicare IM given:   Date Additional Medicare IM given:    Discharge Disposition:    Per UR Regulation:  Reviewed for med. necessity/level of care/duration of stay  If discussed at Long Length of Stay Meetings, dates discussed:    Comments:  07/07/11 [facility] RN,BSN [phone]

## 2011-07-07 NOTE — Progress Notes (Signed)
Subjective: Patient nontoxic-appearing today. He states that he has no further nausea or vomiting. The patient has been tolerating liquids without any difficulty today. He states that his abdominal pain is about a 3/10.  Interval history: CT of the abdomen shows no evidence of pseudocyst or pancreatic necrosis.   Objective: Filed Vitals:   07/07/11 0002 07/07/11 0635 07/07/11 1100 07/07/11 1827  BP: 141/87 123/86 126/84 124/79  Pulse: 82 86 77 96  Temp: 99.4 F (37.4 C) 99.6 F (37.6 C) 98.7 F (37.1 C) 98.8 F (37.1 C)  TempSrc: Oral Oral Oral Oral  Resp: 18 18 17 18   Height:      Weight:  52.5 kg (115 lb 11.9 oz)    SpO2: 97% 99% 97% 100%   Weight change: -4.3 kg (-9 lb 7.7 oz)  Intake/Output Summary (Last 24 hours) at 07/07/11 1944 Last data filed at 07/07/11 1559  Gross per 24 hour  Intake   2340 ml  Output    625 ml  Net   1715 ml    General: Alert, awake, oriented x3, in no acute distress.  HEENT: [place]/AT PEERL, EOMI Neck: Trachea midline,  no masses, no thyromegal,y no JVD, no carotid bruit OROPHARYNX:  Moist, No exudate/ erythema/lesions.  Heart: Regular rate and rhythm, without murmurs, rubs, gallops, PMI non-displaced, no heaves or thrills on palpation.  Lungs: Clear to auscultation, no wheezing or rhonchi noted. No increased vocal fremitus resonant to percussion  Abdomen: Soft, mild diffuse tenderness, nondistended, positive bowel sounds, no masses no hepatosplenomegaly noted.     Lab Results:  Basename 07/07/11 0444 07/06/11 0407 07/05/11 2030  NA 139 139 --  K 3.6 4.4 --  CL 105 102 --  CO2 26 26 --  GLUCOSE 94 117* --  BUN 19 12 --  CREATININE 0.80 0.74 --  CALCIUM 8.8 9.0 --  MG -- -- 1.5  PHOS -- -- 3.5    Basename 07/07/11 0444 07/06/11 0407  AST 59* 52*  ALT 15 19  ALKPHOS 88 103  BILITOT 0.7 0.8  PROT 5.6* 6.6  ALBUMIN 2.2* 2.9*    Basename 07/07/11 0444 07/06/11 0407  LIPASE 762* 2693*  AMYLASE -- --    Basename 07/06/11 0407  07/05/11 2030 07/05/11 1609  WBC 17.6* 17.4* --  NEUTROABS -- 15.5* 12.6*  HGB 18.3* 18.1* --  HCT 50.9 49.7 --  MCV 101.2* 100.0 --  PLT 205 218 --    Basename 07/06/11 1110 07/06/11 0740 07/05/11 2030  CKTOTAL 253* 178 179  CKMB 2.7 1.8 2.5  CKMBINDEX -- -- --  TROPONINI <0.30 <0.30 <0.30   No components found with this basename: POCBNP:3 No results found for this basename: DDIMER:2 in the last 72 hours  Basename 07/05/11 2030  HGBA1C 5.3   No results found for this basename: CHOL:2,HDL:2,LDLCALC:2,TRIG:2,CHOLHDL:2,LDLDIRECT:2 in the last 72 hours  Basename 07/05/11 2030  TSH 1.120  T4TOTAL --  T3FREE --  THYROIDAB --   No results found for this basename: VITAMINB12:2,FOLATE:2,FERRITIN:2,TIBC:2,IRON:2,RETICCTPCT:2 in the last 72 hours  Micro Results: Recent Results (from the past 240 hour(s))  URINE CULTURE     Status: Normal   Collection Time   07/05/11  3:55 PM      Component Value Range Status Comment   Specimen Description URINE, CLEAN CATCH   Final    Special Requests NONE   Final    Culture  Setup Time [id number]   Final    Colony Count 20,OOO COLONIES/ML   Final  Culture     Final    Value: Multiple bacterial morphotypes present, none predominant. Suggest appropriate recollection if clinically indicated.   Report Status 07/06/2011 FINAL   Final   MRSA PCR SCREENING     Status: Normal   Collection Time   07/05/11 10:27 PM      Component Value Range Status Comment   MRSA by PCR NEGATIVE  NEGATIVE  Final     Studies/Results: Dg Abd Acute W/chest  07/05/2011  *RADIOLOGY REPORT*  Clinical Data: Abdominal pain, nausea/vomiting, cough  ACUTE ABDOMEN SERIES (ABDOMEN 2 VIEW & CHEST 1 VIEW)  Comparison: None.  Findings: Lungs are clear. No pleural effusion or pneumothorax.  Cardiomediastinal silhouette is within normal limits.  Nonobstructive bowel gas pattern.  No evidence of free air under the diaphragm on the upright view.  Visualized osseous structures are  within normal limits.  IMPRESSION: No evidence of acute cardiopulmonary disease.  No evidence of small bowel obstruction or free air.  Original Report Authenticated By: Charline Bills, M.D.    Medications: I have reviewed the patient's current medications. Scheduled Meds:    . enoxaparin  40 mg Subcutaneous Q24H  . folic acid  1 mg Oral Daily  . labetalol  200 mg Oral BID  . mulitivitamin with minerals  1 tablet Oral Daily  . pneumococcal 23 valent vaccine  0.5 mL Intramuscular Once  . sodium chloride  3 mL Intravenous Q12H  . thiamine  100 mg Oral Daily   Or  . thiamine  100 mg Intravenous Daily  . DISCONTD: imipenem-cilastatin  500 mg Intravenous Q8H   Continuous Infusions:    . sodium chloride 50 mL/hr at 07/07/11 1514   PRN Meds:.hydrALAZINE, LORazepam, LORazepam, morphine, ondansetron (ZOFRAN) IV, ondansetron, oxyCODONE, promethazine Assessment/Plan: Patient Active Hospital Problem List: Acute alcohol intoxication (07/05/2011)   Assessment: The patient shows no signs of inebriation or alcohol withdrawal at present  Leukocytosis (07/05/2011)   Assessment: Patient shows no signs of infection. In light of the fact that the patient has no complications his pancreatitis will DC the antibiotics. Recheck WBC in the am  HTN (hypertension) (07/05/2011)   Assessment: We'll treat with IV agents until the patient is able to tolerate oral intake    Acute pancreatitis (07/05/2011)   Assessment: The patient's lipase supports the diagnosis of acute pancreatitis. We'll continue with bowel rest and IV support. The patient is awaiting CT of the abdomen to assess however based on the patient's clinical presentation I doubt that the patient has a pseudocyst.    Alcohol withdrawal delirium (07/06/2011)   Assessment: The patient is on the alcohol withdrawal protocol however presently is not showing any signs of active withdrawal delirium.   Vomiting (07/06/2011)   Assessment: Patient has no  further nausea vomiting will advance diet.    Dehydration (07/06/2011)   Assessment: Continue IV fluid support    Hematuria (07/06/2011)   Assessment: Resolved     LOS: 2 days

## 2011-07-07 NOTE — Progress Notes (Signed)
INITIAL ADULT NUTRITION ASSESSMENT Date: 07/07/2011   Time: 12:27 PM Reason for Assessment: Nutrition Risk- unintentional weight loss "appears severely malnourished"  ASSESSMENT: Male 36 y.o.  Dx: Acute alcohol intoxication, acute pancreatitis, hematuria, dehydration, vomiting, HTN  Hx: History reviewed. No pertinent past medical history. History reviewed. No pertinent past surgical history.  Related Meds: lovenox, folic acid, MVI, thiamine  Ht: 5\' 10"  (177.8 cm)  Wt: 115 lb 11.9 oz (52.5 kg)  Ideal Wt: 75.5kg % Ideal Wt: 71  Usual Wt: 125# % Usual Wt: 92  Body mass index is 16.61 kg/(m^2).  Food/Nutrition Related Hx: Decreased intake for the past week.  Followed vegan diet for the past 4 years "just as a choice, hoped it would help my reflux".  Ate 1-2 meals per day.  Labs:  CMP     Component Value Date/Time   NA 139 07/07/2011 0444   K 3.6 07/07/2011 0444   CL 105 07/07/2011 0444   CO2 26 07/07/2011 0444   GLUCOSE 94 07/07/2011 0444   BUN 19 07/07/2011 0444   CREATININE 0.80 07/07/2011 0444   CALCIUM 8.8 07/07/2011 0444   PROT 5.6* 07/07/2011 0444   ALBUMIN 2.2* 07/07/2011 0444   AST 59* 07/07/2011 0444   ALT 15 07/07/2011 0444   ALKPHOS 88 07/07/2011 0444   BILITOT 0.7 07/07/2011 0444   GFRNONAA >90 07/07/2011 0444   GFRAA >90 07/07/2011 0444  Lipase:  762  I/O last 3 completed shifts: In: 4261.7 [I.V.:3471.7; Other:590; IV Piggyback:200] Out: 360 [Urine:360] Total I/O In: 100 [IV Piggyback:100] Out: -    Diet Order: NPO  Supplements/Tube Feeding:none  IVF:    sodium chloride Last Rate: 100 mL/hr at 07/07/11 4540  DISCONTD: sodium chloride Last Rate: 125 mL/hr at 07/05/11 1817    Estimated Nutritional Needs:per day   Kcal: 1800-1900  Protein: 90-100 g protein  Fluid: >1.8L  Vegan diet for the past 4 years.  Ate only 1-2 x per day.  Reports eating well until 1 week ago. Hx of ETOH abuse.  "It was the drinking that got me here".  Pt with 8% weight loss in the  past week, <50% intake for the past 5 days and decreased body fat (moderate).  Pt meets criteria for Severe malnutrition related to acute illness although chronic aspect as well.  NUTRITION DIAGNOSIS: -Inadequate oral intake (NI-2.1).  Status: Ongoing  RELATED TO: altered GI function  AS EVIDENCE BY: NPO status  MONITORING/EVALUATION(Goals): Monitor:  Plan of care, weight status, labs, diet initiation Goal:  Tolerate diet advancement, based on plan of care.  EDUCATION NEEDS: -Education not appropriate at this time  INTERVENTION: 1.  If unable to tolerate diet advancement, recommend enteral nutrition (Place feeding tube past the ligament of treitz) with Osmolite 1.2 at 20 ml/hr increasing 10 ml/hr every 4 hours to goal of 65 ml/hr which would provide:  1872 kcal, 87 gm protein, 1279 ml free water.  2.  Will monitor ability to advance diet.  Pt vegan prior to admit.  Dietitian [phone]  DOCUMENTATION CODES Per approved criteria  -Severe malnutrition in the context of acute illness or injury    Derrell Lolling Anastasia Fiedler 07/07/2011, 12:27 PM

## 2011-07-07 NOTE — Progress Notes (Signed)
PT. HAS HAD SOME DIARRHEA EPISODES, BUT AT THIS TIME DOES NOT WANT THE DOCTOR CALLED NOR DOES HE WANT ANY MEDICATION FOR IT AT THIS TIME.  WILL CONTINUE TO MONITOR AND OBSERVE PT.

## 2011-07-08 LAB — DIFFERENTIAL
Basophils Absolute: 0 10*3/uL (ref 0.0–0.1)
Basophils Relative: 0 % (ref 0–1)
Eosinophils Absolute: 0.5 10*3/uL (ref 0.0–0.7)
Eosinophils Relative: 4 % (ref 0–5)
Monocytes Absolute: 2 10*3/uL — ABNORMAL HIGH (ref 0.1–1.0)

## 2011-07-08 LAB — CBC
HCT: 39.6 % (ref 39.0–52.0)
MCHC: 35.4 g/dL (ref 30.0–36.0)
MCV: 101.8 fL — ABNORMAL HIGH (ref 78.0–100.0)
RDW: 12.8 % (ref 11.5–15.5)

## 2011-07-08 LAB — LIPASE, BLOOD: Lipase: 158 U/L — ABNORMAL HIGH (ref 11–59)

## 2011-07-08 MED ORDER — LORAZEPAM 2 MG/ML IJ SOLN
1.0000 mg | Freq: Four times a day (QID) | INTRAMUSCULAR | Status: DC | PRN
Start: 2011-07-08 — End: 2011-07-10
  Administered 2011-07-08 – 2011-07-10 (×6): 1 mg via INTRAVENOUS
  Filled 2011-07-08 (×5): qty 1

## 2011-07-08 MED ORDER — SODIUM CHLORIDE 0.9 % IV BOLUS (SEPSIS): 2.0000 mL | Freq: Once | INTRAVENOUS | Status: DC

## 2011-07-08 MED ORDER — SODIUM CHLORIDE 0.9 % IV BOLUS (SEPSIS)
2000.0000 mL | Freq: Once | INTRAVENOUS | Status: AC
  Administered 2011-07-08: 1000 mL via INTRAVENOUS

## 2011-07-08 NOTE — Progress Notes (Signed)
Subjective: Patient alert and oriented x3. The patient has been in room. The patient exhibits any very little behavior exposing his genitals inappropriately during the examination. Please note nurse in the room during examination. Patient states that he has some dizziness upon standing. He has had no further nausea or vomiting is tolerating clear liquids well. The patient has been offered an advanced diet however has refused. He reports his pain as being a 7/10 however it is inconsistent with the rest of his reporting. I am not sure the patient fully understands the pain scale.   Interval history: CT of the abdomen shows no evidence of pseudocyst or pancreatic necrosis.   Objective: Filed Vitals:   07/08/11 1415 07/08/11 1650 07/08/11 1653 07/08/11 1655  BP: 143/91 138/79 127/88 118/85  Pulse: 90 74 93 103  Temp: 99.8 F (37.7 C)     TempSrc: Oral     Resp: 20 20 20 20   Height:      Weight:      SpO2: 98%      Weight change: 1.7 kg (3 lb 12 oz)  Intake/Output Summary (Last 24 hours) at 07/08/11 1919 Last data filed at 07/08/11 1500  Gross per 24 hour  Intake 1431.66 ml  Output    570 ml  Net 861.66 ml    General: Alert, awake, oriented x3, in no acute distress.  HEENT: [place]/AT [place]ER[place], EOMI Neck: Trachea midline,  no masses, no thyromegal,y no JVD, no carotid bruit OROPHARYNX:  Moist, No exudate/ erythema/lesions.  Heart: Regular rate and rhythm, without murmurs, rubs, gallops, PMI non-displaced, no heaves or thrills on palpation.  Lungs: Clear to auscultation, no wheezing or rhonchi noted. No increased vocal fremitus resonant to percussion  Abdomen: Soft, mild diffuse tenderness, nondistended, positive bowel sounds, no masses no hepatosplenomegaly noted. Extremeties: No clubbing cyanosis or edema     Lab Results:  Basename 07/07/11 0444 07/06/11 0407 07/05/11 2030  NA 139 139 --  K 3.6 4.4 --  CL 105 102 --  CO2 26 26 --  GLUCOSE 94 117* --  BUN 19 12 --  CREATININE  0.80 0.74 --  CALCIUM 8.8 9.0 --  MG -- -- 1.5  PHOS -- -- 3.5    Basename 07/07/11 0444 07/06/11 0407  AST 59* 52*  ALT 15 19  ALKPHOS 88 103  BILITOT 0.7 0.8  PROT 5.6* 6.6  ALBUMIN 2.2* 2.9*    Basename 07/08/11 0508 07/07/11 0444  LIPASE 158* 762*  AMYLASE -- --    Basename 07/08/11 0508 07/06/11 0407 07/05/11 2030  WBC 15.0* 17.6* --  NEUTROABS 11.2* -- 15.5*  HGB 14.0 18.3* --  HCT 39.6 50.9 --  MCV 101.8* 101.2* --  PLT 147* 205 --    Basename 07/06/11 1110 07/06/11 0740 07/05/11 2030  CKTOTAL 253* 178 179  CKMB 2.7 1.8 2.5  CKMBINDEX -- -- --  TROPONINI <0.30 <0.30 <0.30   No components found with this basename: POCBNP:3 No results found for this basename: DDIMER:2 in the last 72 hours  Basename 07/05/11 2030  HGBA1C 5.3   No results found for this basename: CHOL:2,HDL:2,LDLCALC:2,TRIG:2,CHOLHDL:2,LDLDIRECT:2 in the last 72 hours  Basename 07/05/11 2030  TSH 1.120  T4TOTAL --  T3FREE --  THYROIDAB --   No results found for this basename: VITAMINB12:2,FOLATE:2,FERRITIN:2,TIBC:2,IRON:2,RETICCTPCT:2 in the last 72 hours  Micro Results: Recent Results (from the past 240 hour(s))  URINE CULTURE     Status: Normal   Collection Time   07/05/11  3:55 PM  Component Value Range Status Comment   Specimen Description URINE, CLEAN CATCH   Final    Special Requests NONE   Final    Culture  Setup Time [id number]   Final    Colony Count 20,OOO COLONIES/ML   Final    Culture     Final    Value: Multiple bacterial morphotypes present, none predominant. Suggest appropriate recollection if clinically indicated.   Report Status 07/06/2011 FINAL   Final   MRSA PCR SCREENING     Status: Normal   Collection Time   07/05/11 10:27 PM      Component Value Range Status Comment   MRSA by PCR NEGATIVE  NEGATIVE  Final     Studies/Results: Dg Abd Acute W/chest  07/05/2011  *RADIOLOGY REPORT*  Clinical Data: Abdominal pain, nausea/vomiting, cough  ACUTE ABDOMEN  SERIES (ABDOMEN 2 VIEW & CHEST 1 VIEW)  Comparison: None.  Findings: Lungs are clear. No pleural effusion or pneumothorax.  Cardiomediastinal silhouette is within normal limits.  Nonobstructive bowel gas pattern.  No evidence of free air under the diaphragm on the upright view.  Visualized osseous structures are within normal limits.  IMPRESSION: No evidence of acute cardiopulmonary disease.  No evidence of small bowel obstruction or free air.  Original Report Authenticated By: Charline Bills, M.D.    Medications: I have reviewed the patient's current medications. Scheduled Meds:    . enoxaparin  40 mg Subcutaneous Q24H  . folic acid  1 mg Oral Daily  . labetalol  200 mg Oral BID  . mulitivitamin with minerals  1 tablet Oral Daily  . pneumococcal 23 valent vaccine  0.5 mL Intramuscular Once  . sodium chloride  2,000 mL Intravenous Once  . sodium chloride  3 mL Intravenous Q12H  . thiamine  100 mg Oral Daily   Or  . thiamine  100 mg Intravenous Daily  . DISCONTD: sodium chloride  2 mL Intravenous Once   Continuous Infusions:    . DISCONTD: sodium chloride 50 mL/hr at 07/08/11 1131   PRN Meds:.hydrALAZINE, LORazepam, LORazepam, morphine, ondansetron (ZOFRAN) IV, ondansetron, oxyCODONE, promethazine Assessment/Plan: Patient Active Hospital Problem List: Orthostatic hypotension   Assessment: Patient was found to be orthostatic on standing. He appears to be somewhat volume depleted. I will go ahead and get the patient a bolus of fluid and recheck his orthostatics.  Acute alcohol intoxication (07/05/2011)   Assessment: The patient shows no signs of inebriation or alcohol withdrawal at present  Leukocytosis (07/05/2011)   Assessment: Patient shows no signs of infection. In light of the fact that the patient has no complications his pancreatitis will DC the antibiotics. Recheck WBC in the am  HTN (hypertension) (07/05/2011)   Assessment: We'll treat with IV agents until the patient is able  to tolerate oral intake    Acute pancreatitis (07/05/2011)   Assessment: The patient has significant decrease in his lipase. Per the patient's conversation appears that he may have a chronic component to his pancreatitis. At this point I've asked the patient to advance his diet to a heart healthy diet as he's not having significant pain with eating. Additionally I will treat the patient's pain with oral pain medications at this time.   Alcohol withdrawal delirium (07/06/2011)   Assessment: The patient is on the alcohol withdrawal protocol however presently is not showing any signs of active withdrawal delirium.   Vomiting (07/06/2011)   Assessment: Resolved. Diet advanced to a heart healthy diet.    Dehydration (07/06/2011)   Assessment:  Continue IV fluid support    Hematuria (07/06/2011)   Assessment: Resolved  Disposition: Accepted the patient would be ready for discharge within 48 hours.     LOS: 3 days

## 2011-07-09 DIAGNOSIS — E876 Hypokalemia: Secondary | ICD-10-CM

## 2011-07-09 LAB — DIFFERENTIAL
Basophils Relative: 0 % (ref 0–1)
Eosinophils Absolute: 0.6 10*3/uL (ref 0.0–0.7)
Lymphocytes Relative: 10 % — ABNORMAL LOW (ref 12–46)
Monocytes Absolute: 2.5 10*3/uL — ABNORMAL HIGH (ref 0.1–1.0)
Neutro Abs: 10.2 10*3/uL — ABNORMAL HIGH (ref 1.7–7.7)

## 2011-07-09 LAB — BASIC METABOLIC PANEL
BUN: 11 mg/dL (ref 6–23)
Chloride: 105 mEq/L (ref 96–112)
Glucose, Bld: 99 mg/dL (ref 70–99)
Potassium: 3 mEq/L — ABNORMAL LOW (ref 3.5–5.1)

## 2011-07-09 LAB — CBC
HCT: 39.7 % (ref 39.0–52.0)
Hemoglobin: 14.2 g/dL (ref 13.0–17.0)
MCH: 35.8 pg — ABNORMAL HIGH (ref 26.0–34.0)
MCHC: 35.8 g/dL (ref 30.0–36.0)

## 2011-07-09 LAB — MAGNESIUM: Magnesium: 1.7 mg/dL (ref 1.5–2.5)

## 2011-07-09 MED ORDER — SODIUM CHLORIDE 0.9 % IV BOLUS (SEPSIS)
500.0000 mL | Freq: Once | INTRAVENOUS | Status: AC
  Administered 2011-07-09: 500 mL via INTRAVENOUS

## 2011-07-09 MED ORDER — SODIUM CHLORIDE 0.9 % IV SOLN
INTRAVENOUS | Status: DC
  Administered 2011-07-09 – 2011-07-10 (×3): via INTRAVENOUS

## 2011-07-09 MED ORDER — POTASSIUM CHLORIDE CRYS ER 20 MEQ PO TBCR
40.0000 meq | EXTENDED_RELEASE_TABLET | ORAL | Status: AC
  Administered 2011-07-09 (×2): 40 meq via ORAL
  Filled 2011-07-09 (×2): qty 2

## 2011-07-09 MED ORDER — CHLORPROMAZINE HCL 10 MG PO TABS
10.0000 mg | ORAL_TABLET | Freq: Four times a day (QID) | ORAL | Status: DC | PRN
  Administered 2011-07-09 – 2011-07-10 (×3): 10 mg via ORAL
  Filled 2011-07-09 (×5): qty 1

## 2011-07-09 MED ORDER — NICOTINE 21 MG/24HR TD PT24
21.0000 mg | MEDICATED_PATCH | Freq: Every day | TRANSDERMAL | Status: DC
  Administered 2011-07-09 – 2011-07-10 (×2): 21 mg via TRANSDERMAL
  Filled 2011-07-09 (×2): qty 1

## 2011-07-09 NOTE — Progress Notes (Signed)
Subjective: Had some vomiting earlier today. Has hiccups. Abdominal pain improved.  Objective: Vital signs in last 24 hours: Temp:  [99 F (37.2 C)-99.9 F (37.7 C)] 99.4 F (37.4 C) (04/24 1345) Pulse Rate:  [74-103] 86  (04/24 1345) Resp:  [18-20] 18  (04/24 1345) BP: (101-167)/(71-108) 162/100 mmHg (04/24 1352) SpO2:  [98 %-99 %] 99 % (04/24 1345) Weight:  [55.8 kg (123 lb 0.3 oz)] 55.8 kg (123 lb 0.3 oz) (04/24 0606) Weight change: 1.7 kg (3 lb 12 oz) Last BM Date: 07/07/11  Intake/Output from previous day: 04/23 0701 - 04/24 0700 In: 1050.8 [P.O.:120; I.V.:420.8; IV Piggyback:510] Out: 620 [Urine:620] Total I/O In: -  Out: 100 [Emesis/NG output:100]   Physical Exam: General: Alert, awake, oriented x3, in no acute distress. HEENT: No bruits, no goiter. Heart: Regular rate and rhythm, without murmurs, rubs, gallops. Lungs: Clear to auscultation bilaterally. Abdomen: Soft, nontender, nondistended, positive bowel sounds. Extremities: No clubbing cyanosis or edema with positive pedal pulses. Neuro: Grossly intact, nonfocal.    Lab Results: Basic Metabolic Panel:  Basename 07/09/11 0448 07/07/11 0444  NA 137 139  K 3.0* 3.6  CL 105 105  CO2 24 26  GLUCOSE 99 94  BUN 11 19  CREATININE 0.68 0.80  CALCIUM 8.4 8.8  MG -- --  PHOS -- --   Liver Function Tests:  [facility] 07/07/11 0444  AST 59*  ALT 15  ALKPHOS 88  BILITOT 0.7  PROT 5.6*  ALBUMIN 2.2*    Basename 07/09/11 0448 07/08/11 0508  LIPASE 79* 158*  AMYLASE -- --   CBC:  Basename 07/09/11 0448 07/08/11 0508  WBC 14.8* 15.0*  NEUTROABS 10.2* 11.2*  HGB 14.2 14.0  HCT 39.7 39.6  MCV 100.0 101.8*  PLT 199 147*   CBG:  Basename 07/09/11 0809 07/08/11 0729 07/07/11 0743  GLUCAP 109* 112* 112*   Thyroid Function Tests:  Basename 07/09/11 0448  TSH 3.679  T4TOTAL --  Domingo Cocking --  T3FREE --  THYROIDAB --   Urine Drug Screen: Drugs of Abuse     Component Value Date/Time   LABOPIA  POSITIVE* 07/05/2011 2016   COCAINSCRNUR NONE DETECTED 07/05/2011 2016   LABBENZ NONE DETECTED 07/05/2011 2016   AMPHETMU NONE DETECTED 07/05/2011 2016   THCU POSITIVE* 07/05/2011 2016   LABBARB NONE DETECTED 07/05/2011 2016     Recent Results (from the past 240 hour(s))  URINE CULTURE     Status: Normal   Collection Time   07/05/11  3:55 PM      Component Value Range Status Comment   Specimen Description URINE, CLEAN CATCH   Final    Special Requests NONE   Final    Culture  Setup Time [id number]   Final    Colony Count 20,OOO COLONIES/ML   Final    Culture     Final    Value: Multiple bacterial morphotypes present, none predominant. Suggest appropriate recollection if clinically indicated.   Report Status 07/06/2011 FINAL   Final   MRSA PCR SCREENING     Status: Normal   Collection Time   07/05/11 10:27 PM      Component Value Range Status Comment   MRSA by PCR NEGATIVE  NEGATIVE  Final     Studies/Results: No results found.  Medications: Scheduled Meds:    . enoxaparin  40 mg Subcutaneous Q24H  . folic acid  1 mg Oral Daily  . labetalol  200 mg Oral BID  . mulitivitamin with minerals  1 tablet  Oral Daily  . nicotine  21 mg Transdermal Daily  . pneumococcal 23 valent vaccine  0.5 mL Intramuscular Once  . sodium chloride  2,000 mL Intravenous Once  . sodium chloride  500 mL Intravenous Once  . sodium chloride  3 mL Intravenous Q12H  . thiamine  100 mg Oral Daily   Or  . thiamine  100 mg Intravenous Daily  . DISCONTD: sodium chloride  2 mL Intravenous Once   Continuous Infusions:    . DISCONTD: sodium chloride 50 mL/hr at 07/08/11 1131   PRN Meds:.chlorproMAZINE, hydrALAZINE, LORazepam, LORazepam, LORazepam, morphine, ondansetron (ZOFRAN) IV, ondansetron, oxyCODONE, promethazine  Assessment/Plan:  Principal Problem:  *Acute alcohol intoxication Active Problems:  Leukocytosis  HTN (hypertension)  Acute pancreatitis  Alcohol withdrawal delirium  Vomiting   Dehydration  Hypokalemia   #1 Acute pancreatitis: Clinically improving. Continue current diet. Antiemetics as needed, thorazine for hiccups.  #2 Acute ETOH intoxication: no signs of withdrawal. Cont thiamine and folate.  #3 Hypokalemia: 2/2 emesis. Replete PO and check Mg level which is usually low in alcoholics.  #4 Orthostasis: keep on IVF today.  #5 Dispo: Plan for DC home in am.   LOS: 4 days   [facility] Pager: [phone] 07/09/2011, 2:03 PM

## 2011-07-09 NOTE — Progress Notes (Signed)
Pt is nauseous and vomiting. Pt vomited about 100-150 cc of brown bile with some blood in it. Pt continues to have hiccups. MD notified.

## 2011-07-09 NOTE — Progress Notes (Signed)
Pt's morning orthostatics positive, MD notified, orders received for bolus. Daphine Deutscher, Miranda [place]

## 2011-07-10 LAB — CBC
HCT: 38.4 % — ABNORMAL LOW (ref 39.0–52.0)
Hemoglobin: 13.6 g/dL (ref 13.0–17.0)
MCV: 98.5 fL (ref 78.0–100.0)
RDW: 12.6 % (ref 11.5–15.5)
WBC: 13.3 10*3/uL — ABNORMAL HIGH (ref 4.0–10.5)

## 2011-07-10 LAB — BASIC METABOLIC PANEL
CO2: 25 mEq/L (ref 19–32)
Chloride: 106 mEq/L (ref 96–112)
Creatinine, Ser: 0.69 mg/dL (ref 0.50–1.35)
Potassium: 3.1 mEq/L — ABNORMAL LOW (ref 3.5–5.1)

## 2011-07-10 MED ORDER — THIAMINE HCL 100 MG PO TABS: 100.0000 mg | ORAL_TABLET | Freq: Every day | ORAL | Status: DC

## 2011-07-10 MED ORDER — POTASSIUM CHLORIDE CRYS ER 20 MEQ PO TBCR
40.0000 meq | EXTENDED_RELEASE_TABLET | Freq: Once | ORAL | Status: AC
  Administered 2011-07-10: 40 meq via ORAL
  Filled 2011-07-10: qty 2

## 2011-07-10 MED ORDER — OXYCODONE HCL 5 MG PO TABS: 5.0000 mg | ORAL_TABLET | ORAL | Status: AC | PRN

## 2011-07-10 MED ORDER — ONDANSETRON HCL 4 MG PO TABS: 4.0000 mg | ORAL_TABLET | ORAL | Status: DC | PRN

## 2011-07-10 MED ORDER — ADULT MULTIVITAMIN W/MINERALS CH: 1.0000 | ORAL_TABLET | Freq: Every day | ORAL | Status: DC

## 2011-07-10 MED ORDER — CHLORPROMAZINE HCL 10 MG PO TABS: 10.0000 mg | ORAL_TABLET | Freq: Four times a day (QID) | ORAL | Status: DC | PRN

## 2011-07-10 MED ORDER — ONDANSETRON HCL 4 MG PO TABS: 4.0000 mg | ORAL_TABLET | ORAL | Status: AC | PRN

## 2011-07-10 MED ORDER — FOLIC ACID 1 MG PO TABS: 1.0000 mg | ORAL_TABLET | Freq: Every day | ORAL | Status: DC

## 2011-07-10 NOTE — Progress Notes (Signed)
   CARE MANAGEMENT NOTE 07/10/2011  Patient:  Zachary Harper, Zachary Harper   Account Number:  [id number]  Date Initiated:  07/07/2011  Documentation initiated by:  Lanier Clam  Subjective/Objective Assessment:   ADMITTED W/ETOH INTOXICATION.     Action/Plan:   FROM HOME   Anticipated DC Date:  07/11/2011   Anticipated DC Plan:  HOME/SELF CARE  In-house referral  Clinical Social Worker         Choice offered to / List presented to:             Status of service:  In process, will continue to follow Medicare Important Message given?   (If response is "NO", the following Medicare IM given date fields will be blank) Date Medicare IM given:   Date Additional Medicare IM given:    Discharge Disposition:    Per UR Regulation:  Reviewed for med. necessity/level of care/duration of stay  If discussed at Long Length of Stay Meetings, dates discussed:    Comments:  07/10/11 Addisyn Leclaire RN,BSN NCM [phone] NO ANTICIPATED D/C NEEDS.CSW NEEDS HAS BEEN ADDRESSED.  07/07/11 Nancye Grumbine RN,BSN NCM [phone]

## 2011-07-10 NOTE — Discharge Summary (Signed)
Physician Discharge Summary  Patient ID: Zachary Harper MRN: [id number] DOB/AGE: 09-01-1975 36 y.o.  Admit date: 07/05/2011 Discharge date: 07/10/2011  Primary Care Physician:  Provider Not In System   Discharge Diagnoses:    Principal Problem:  *Acute alcohol intoxication Active Problems:  Leukocytosis  HTN (hypertension)  Acute pancreatitis  Alcohol withdrawal delirium  Vomiting  Dehydration  Hypokalemia    Medication List  As of 07/10/2011  3:23 PM   TAKE these medications         chlorproMAZINE 10 MG tablet   Commonly known as: THORAZINE   Take 1 tablet (10 mg total) by mouth 4 (four) times daily as needed.      folic acid 1 MG tablet   Commonly known as: FOLVITE   Take 1 tablet (1 mg total) by mouth daily.      mulitivitamin with minerals Tabs   Take 1 tablet by mouth daily.      ondansetron 4 MG tablet   Commonly known as: ZOFRAN   Take 1 tablet (4 mg total) by mouth every 4 (four) hours as needed.      oxyCODONE 5 MG immediate release tablet   Commonly known as: Oxy IR/ROXICODONE   Take 1 tablet (5 mg total) by mouth every 4 (four) hours as needed.      thiamine 100 MG tablet   Take 1 tablet (100 mg total) by mouth daily.             Disposition and Follow-up:  Will be discharged home today in stable and improved condition. CM will try to assist him with PCP referral prior to DC.  Consults:  None    Significant Diagnostic Studies:  Ct Abdomen Pelvis W Contrast  07/06/2011  *RADIOLOGY REPORT*  Clinical Data: Acute pancreatitis.  Acute alcohol intoxication. Leukocytosis.  Alcohol withdrawal.  Vomiting.  CT ABDOMEN AND PELVIS WITH CONTRAST  Technique:  Multidetector CT imaging of the abdomen and pelvis was performed following the standard protocol during bolus administration of intravenous contrast.  Contrast: 80mL OMNIPAQUE IOHEXOL 300 MG/ML  SOLN  Comparison: Plain films of 1 day prior.  Findings: Minimal nodularity right lung base, 3 mm on image  2. Possible central lucency.  Normal heart size with small left greater than right bilateral pleural effusions.  No pericardial effusion.  Small to moderate abdominal pelvic ascites.  Moderate to marked hepatic steatosis.  Heterogeneous density, primarily within the right lobe is favored to be related to heterogeneous steatosis. Example image 8 within the hepatic dome.  Portal and hepatic veins patent.  Normal spleen, stomach.  Moderate pancreatic edema.  No well-defined areas of pancreatic necrosis.  There is heterogeneous enhancement throughout the pancreas.  This is favored to be related to partial fatty atrophy/fatty replacement.  No pancreatic ductal dilatation.  No intrahepatic biliary ductal dilatation.  Normal common duct size.  No calcified gallstones.  Normal adrenal glands and kidneys. No retroperitoneal or retrocrural adenopathy.  Normal colon and terminal ileum.  Appendix not definitely visualized.  Normal caliber small bowel.  No pelvic adenopathy.  Foley catheter within urinary bladder. Possible mild prostatomegaly.  Cul-de-sac fluid.  Possible minimal complexity dependently within, image 76.  Nonspecific heterogeneous marrow density within the pelvis.  IMPRESSION:  1.  Moderate uncomplicated pancreatitis.  Heterogeneous pancreatic density is favored to be related to fatty atrophy.  No well-defined areas of hypoenhancement or hypoattenuation to suggest pancreatic necrosis. 2.  Moderate to abdominal pelvic ascites.  Question minimal complexity within the cul-de-sac fluid.  This is presumably secondary to the pancreatic process. 3.  Moderate to marked hepatic steatosis. Areas of heterogeneous density, primarily within the right lobe, are favored to be related to heterogeneous steatosis. 4.  Small bilateral pleural effusions. 5.  Tiny right lung base nodule. Given risk factors for bronchogenic carcinoma, follow-up chest CT at 1 year is recommended.  This recommendation follows the consensus statement:  "Guidelines for Management of Small Pulmonary Nodules Detected on CT Scans:  A Statement from the Fleischner Society" as published in Radiology 2005; 237:395-400.  Available online at: DietDisorder.cz.  Original Report Authenticated By: Consuello Bossier, M.D.   Dg Abd Acute W/chest  07/05/2011  *RADIOLOGY REPORT*  Clinical Data: Abdominal pain, nausea/vomiting, cough  ACUTE ABDOMEN SERIES (ABDOMEN 2 VIEW & CHEST 1 VIEW)  Comparison: None.  Findings: Lungs are clear. No pleural effusion or pneumothorax.  Cardiomediastinal silhouette is within normal limits.  Nonobstructive bowel gas pattern.  No evidence of free air under the diaphragm on the upright view.  Visualized osseous structures are within normal limits.  IMPRESSION: No evidence of acute cardiopulmonary disease.  No evidence of small bowel obstruction or free air.  Original Report Authenticated By: Charline Bills, M.D.   Brief H and P: For complete details please refer to admission H and P, but in brief 36 year old male with history of alcohol abuse who presented to ED with complaints of sever abdominal pain in epigastric and periumbilical region, 8-9/10 in intensity, radiating to back, constant. Patient reports associated nausea and vomiting, non bloody. No complaints of constipation or diarrhea, no blood in stool or urine. No complaints of shortness of breath, no chest pain, no fever or chills at home. No dizziness or lightheadedness. We were asked to admit him for further evaluation and management.     Hospital Course:  Principal Problem:  *Acute alcohol intoxication Active Problems:  Leukocytosis  HTN (hypertension)  Acute pancreatitis  Alcohol withdrawal delirium  Vomiting  Dehydration  Hypokalemia   #1 Acute Pancreatitis: clinically improved. No further emesis and abdominal pain almost completely resolved. I feel like patient can manage his symptoms at home at this point.  #2 ETOH abuse:  he has been detoxed. Has been seen by SW and given community resources for his alcoholism. I have also counseled him extensively on the importance of ETOH cessation for his pancreas sake. He seems inclined to quit and seek help at this point.  #3 Hypokalemia: K is 3.2 today. Will give 40 meq of KCl prior to DC home.  Time spent on Discharge: Greater than 30 minutes  Signed: Chaya Jan [facility] Pager: [phone] 07/10/2011, 3:23 PM

## 2011-07-10 NOTE — Progress Notes (Signed)
   CARE MANAGEMENT NOTE 07/10/2011  Patient:  NISHANT, SCHRECENGOST   Account Number:  [id number]  Date Initiated:  07/07/2011  Documentation initiated by:  Lanier Clam  Subjective/Objective Assessment:   ADMITTED W/ETOH INTOXICATION.     Action/Plan:   FROM HOME   Anticipated DC Date:  07/11/2011   Anticipated DC Plan:  HOME/SELF CARE  In-house referral  Clinical Social Worker      DC Planning Services  Medication Assistance  GCCN / P4HM (established/new)      Choice offered to / List presented to:             Status of service:  Completed, signed off Medicare Important Message given?   (If response is "NO", the following Medicare IM given date fields will be blank) Date Medicare IM given:   Date Additional Medicare IM given:    Discharge Disposition:  HOME/SELF CARE  Per UR Regulation:  Reviewed for med. necessity/level of care/duration of stay  If discussed at Long Length of Stay Meetings, dates discussed:    Comments:  07/10/11 Maitri Schnoebelen RN,BSN NCM [phone] UPDATE:CONTACTED [facility] WHO WILL SET UP [facility] APPT, & CONTACT PATIENT.INDIGENT FUNDS PROVIDED,INFORMED OF POLICY. NO ANTICIPATED D/C NEEDS.CSW NEEDS HAS BEEN ADDRESSED.  07/07/11 Bexley Laubach RN,BSN NCM [phone]

## 2011-08-22 ENCOUNTER — Encounter (HOSPITAL_COMMUNITY): Payer: Self-pay | Admitting: *Deleted

## 2011-08-22 ENCOUNTER — Emergency Department (HOSPITAL_COMMUNITY)
Admission: EM | Admit: 2011-08-22 | Discharge: 2011-08-22 | Disposition: A | Discharge status: Home / Self Care | Payer: Medicaid Other | Attending: Emergency Medicine | Admitting: Emergency Medicine

## 2011-08-22 DIAGNOSIS — F17200 Nicotine dependence, unspecified, uncomplicated: Secondary | ICD-10-CM | POA: Insufficient documentation

## 2011-08-22 DIAGNOSIS — K644 Residual hemorrhoidal skin tags: Secondary | ICD-10-CM | POA: Insufficient documentation

## 2011-08-22 MED ORDER — DOCUSATE SODIUM 100 MG PO CAPS: 100.0000 mg | ORAL_CAPSULE | Freq: Two times a day (BID) | ORAL | Status: AC

## 2011-08-22 MED ORDER — HYDROCORTISONE 2.5 % RE CREA: TOPICAL_CREAM | RECTAL | Status: AC

## 2011-08-22 MED ORDER — HYDROCODONE-ACETAMINOPHEN 5-325 MG PO TABS: 1.0000 | ORAL_TABLET | Freq: Four times a day (QID) | ORAL | Status: AC | PRN

## 2011-08-22 NOTE — ED Provider Notes (Signed)
Medical screening examination/treatment/procedure(s) were performed by non-physician practitioner and as supervising physician I was immediately available for consultation/collaboration.   Dayton Bailiff, MD 08/22/11 1701

## 2011-08-22 NOTE — ED Provider Notes (Signed)
History     CSN: [id number]  Arrival date & time 08/22/11  1302   First MD Initiated Contact with Patient 08/22/11 1506      Chief Complaint  Patient presents with  . Hemorrhoids    (Consider location/radiation/quality/duration/timing/severity/associated sxs/prior treatment) HPI Patient presents to ER with complaint of hemorrhoids.  He states that the swelling began several days ago and there is increased pain.  Patient, states he has had, once before.  Patient denies nausea, vomiting, abdominal pain, rectal bleeding, weakness, or diarrhea.  Patient, states it mainly hurts when he sits has a bowel movement.  Patient has not tried anything for his pain. History reviewed. No pertinent past medical history.  History reviewed. No pertinent past surgical history.  No family history on file.  History  Substance Use Topics  . Smoking status: Current Everyday Smoker -- 1.0 packs/day    Types: Cigarettes  . Smokeless tobacco: Never Used  . Alcohol Use: 0.0 oz/week    12-24 Shots of liquor per week     daily; (08/22/11-reports drinking a fifth in last week, had not drank since getting out of hospital in april      Review of Systems All other systems negative except as documented in the HPI. All pertinent positives and negatives as reviewed in the HPI.  Allergies  Review of patient's allergies indicates no known allergies.  Home Medications   Current Outpatient Rx  Name Route Sig Dispense Refill  . CHLORPROMAZINE HCL 10 MG PO TABS Oral Take 1 tablet (10 mg total) by mouth 4 (four) times daily as needed. 30 tablet 0    BP 121/78  Pulse 73  Temp(Src) 98.3 F (36.8 C) (Oral)  Resp 24  SpO2 99%  Physical Exam  Nursing note and vitals reviewed. Constitutional: He appears well-developed and well-nourished.  Cardiovascular: Normal rate and regular rhythm.   Pulmonary/Chest: Effort normal and breath sounds normal.  Genitourinary: Rectal exam shows external hemorrhoid and  tenderness.       ED Course  Procedures    I spoke with surgery about The patient and they will follow up with him on Monday.  Patient will be given pain control, stool softener, and external cream.  Patient is told to return to the emergency department as needed.  He is also advised to use warm sitz baths.   MDM          Carlyle Dolly, PA-C 08/22/11 1636

## 2011-08-22 NOTE — ED Notes (Signed)
Pt reports rectal pain r/t hemorrhoids. C/o swelling. Denies bleeding.

## 2011-08-22 NOTE — Discharge Instructions (Signed)
Call the surgeon Monday morning for an appointment in there emergency clinic. Use warm bath soaks.

## 2012-10-07 ENCOUNTER — Observation Stay (HOSPITAL_COMMUNITY)
Admission: EM | Admit: 2012-10-07 | Discharge: 2012-10-09 | Disposition: A | Discharge status: Home / Self Care | Payer: Medicaid Other | Attending: Internal Medicine | Admitting: Internal Medicine

## 2012-10-07 ENCOUNTER — Encounter (HOSPITAL_COMMUNITY): Payer: Self-pay | Admitting: Emergency Medicine

## 2012-10-07 DIAGNOSIS — K859 Acute pancreatitis without necrosis or infection, unspecified: Principal | ICD-10-CM

## 2012-10-07 DIAGNOSIS — R824 Acetonuria: Secondary | ICD-10-CM | POA: Insufficient documentation

## 2012-10-07 DIAGNOSIS — R911 Solitary pulmonary nodule: Secondary | ICD-10-CM | POA: Insufficient documentation

## 2012-10-07 DIAGNOSIS — F1910 Other psychoactive substance abuse, uncomplicated: Secondary | ICD-10-CM | POA: Insufficient documentation

## 2012-10-07 DIAGNOSIS — E876 Hypokalemia: Secondary | ICD-10-CM

## 2012-10-07 DIAGNOSIS — I10 Essential (primary) hypertension: Secondary | ICD-10-CM | POA: Insufficient documentation

## 2012-10-07 DIAGNOSIS — D72829 Elevated white blood cell count, unspecified: Secondary | ICD-10-CM

## 2012-10-07 DIAGNOSIS — E869 Volume depletion, unspecified: Secondary | ICD-10-CM | POA: Insufficient documentation

## 2012-10-07 DIAGNOSIS — E860 Dehydration: Secondary | ICD-10-CM

## 2012-10-07 DIAGNOSIS — Z79899 Other long term (current) drug therapy: Secondary | ICD-10-CM | POA: Insufficient documentation

## 2012-10-07 DIAGNOSIS — F17200 Nicotine dependence, unspecified, uncomplicated: Secondary | ICD-10-CM | POA: Insufficient documentation

## 2012-10-07 HISTORY — DX: Essential (primary) hypertension: I10

## 2012-10-07 LAB — URINALYSIS, ROUTINE W REFLEX MICROSCOPIC
Bilirubin Urine: NEGATIVE
Ketones, ur: >80 mg/dL — AB
Nitrite: NEGATIVE
Protein, ur: NEGATIVE mg/dL
Urobilinogen, UA: 0.2 mg/dL (ref 0.0–1.0)
pH: 7 (ref 5.0–8.0)

## 2012-10-07 LAB — CBC WITH DIFFERENTIAL/PLATELET
Basophils Relative: 0 % (ref 0–1)
Eosinophils Absolute: 0 10*3/uL (ref 0.0–0.7)
Hemoglobin: 16.5 g/dL (ref 13.0–17.0)
MCH: 34.2 pg — ABNORMAL HIGH (ref 26.0–34.0)
MCHC: 35.5 g/dL (ref 30.0–36.0)
Monocytes Relative: 5 % (ref 3–12)
Neutrophils Relative %: 83 % — ABNORMAL HIGH (ref 43–77)
RDW: 12.3 % (ref 11.5–15.5)

## 2012-10-07 LAB — COMPREHENSIVE METABOLIC PANEL
Albumin: 3.8 g/dL (ref 3.5–5.2)
BUN: 15 mg/dL (ref 6–23)
Creatinine, Ser: 0.8 mg/dL (ref 0.50–1.35)
Potassium: 3.3 mEq/L — ABNORMAL LOW (ref 3.5–5.1)
Total Protein: 7.2 g/dL (ref 6.0–8.3)

## 2012-10-07 LAB — LIPASE, BLOOD: Lipase: 235 U/L — ABNORMAL HIGH (ref 11–59)

## 2012-10-07 MED ORDER — MORPHINE SULFATE 4 MG/ML IJ SOLN
4.0000 mg | Freq: Once | INTRAMUSCULAR | Status: AC
  Administered 2012-10-07: 4 mg via INTRAVENOUS
  Filled 2012-10-07: qty 1

## 2012-10-07 MED ORDER — PANTOPRAZOLE SODIUM 40 MG PO TBEC
40.0000 mg | DELAYED_RELEASE_TABLET | Freq: Every day | ORAL | Status: DC
  Administered 2012-10-08 – 2012-10-09 (×2): 40 mg via ORAL
  Filled 2012-10-07 (×2): qty 1

## 2012-10-07 MED ORDER — ONDANSETRON HCL 4 MG/2ML IJ SOLN
4.0000 mg | Freq: Once | INTRAMUSCULAR | Status: AC
  Administered 2012-10-07: 4 mg via INTRAVENOUS
  Filled 2012-10-07: qty 2

## 2012-10-07 MED ORDER — SODIUM CHLORIDE 0.9 % IV SOLN
1000.0000 mL | Freq: Once | INTRAVENOUS | Status: AC
  Administered 2012-10-07: 1000 mL via INTRAVENOUS

## 2012-10-07 NOTE — ED Provider Notes (Signed)
CSN: [id number]     Arrival date & time 10/07/12  1745 History     First MD Initiated Contact with Patient 10/07/12 2109     Chief Complaint  Patient presents with  . Abdominal Pain   (Consider location/radiation/quality/duration/timing/severity/associated sxs/prior Treatment) HPI Comments: Patient is a 37 year old male with a past medical history of hypertension and pancreatitis who presents with a 1 day history of abdominal pain. The pain is located in the epigastrium and radiates to his back. The pain is described as aching and severe. The pain started gradually and progressively worsened since the onset. No alleviating/aggravating factors. The patient has tried nothing for symptoms without relief. Associated symptoms include nausea and vomiting. Patient denies fever, headache, diarrhea, chest pain, SOB, dysuria, constipation. Patient admits to drinking every other day or every few days.     Past Medical History  Diagnosis Date  . Hypertension    History reviewed. No pertinent past surgical history. No family history on file. History  Substance Use Topics  . Smoking status: Current Every Day Smoker -- 1.00 packs/day    Types: Cigarettes  . Smokeless tobacco: Never Used  . Alcohol Use: 0.0 oz/week    12-24 Shots of liquor per week     Comment: daily; (08/22/11-reports drinking a fifth in last week, had not drank since getting out of hospital in april    Review of Systems  Gastrointestinal: Positive for nausea, vomiting and abdominal pain.  All other systems reviewed and are negative.    Allergies  Review of patient's allergies indicates no known allergies.  Home Medications  No current outpatient prescriptions on file. BP 139/82  Pulse 72  Temp(Src) 98.6 F (37 C) (Oral)  Resp 20  SpO2 100% Physical Exam  Nursing note and vitals reviewed. Constitutional: He is oriented to person, place, and time. He appears well-developed and well-nourished. No distress.  HENT:   Head: Normocephalic and atraumatic.  Eyes: Conjunctivae and EOM are normal. Pupils are equal, round, and reactive to light. No scleral icterus.  Neck: Normal range of motion.  Cardiovascular: Normal rate and regular rhythm.  Exam reveals no gallop and no friction rub.   No murmur heard. Pulmonary/Chest: Effort normal and breath sounds normal. He has no wheezes. He has no rales. He exhibits no tenderness.  Abdominal: Soft. He exhibits no distension. There is tenderness. There is no rebound and no guarding.  Epigastric tenderness to palpation. No peritoneal signs or other focal tenderness to palpation.   Musculoskeletal: Normal range of motion.  Neurological: He is alert and oriented to person, place, and time. Coordination normal.  Speech is goal-oriented. Moves limbs without ataxia.   Skin: Skin is warm and dry.  Psychiatric: He has a normal mood and affect. His behavior is normal.    ED Course   Procedures (including critical care time)  Labs Reviewed  URINALYSIS, ROUTINE W REFLEX MICROSCOPIC - Abnormal; Notable for the following:    APPearance CLOUDY (*)    Specific Gravity, Urine 1.031 (*)    Ketones, ur >80 (*)    All other components within normal limits  CBC WITH DIFFERENTIAL - Abnormal; Notable for the following:    WBC 11.4 (*)    MCH 34.2 (*)    Neutrophils Relative % 83 (*)    Neutro Abs 9.4 (*)    Lymphocytes Relative 11 (*)    All other components within normal limits  COMPREHENSIVE METABOLIC PANEL - Abnormal; Notable for the following:  Potassium 3.3 (*)    All other components within normal limits  LIPASE, BLOOD - Abnormal; Notable for the following:    Lipase 235 (*)    All other components within normal limits   No results found.  1. Pancreatitis     MDM  9:32 PM Labs pending. Vitals stable and patient afebrile. Patient will have fluids, morphine, and zofran for symptoms.   10:56 PM Patient has elevated WBC and elevated lipase. Patient will be  admitted for pancreatitis.   11:30 PM Dr. Mahala Menghini will admit the patient for acute pancreatitis. Vitals stable and patient afebrile.   Emilia Beck, PA-C 10/07/12 2330

## 2012-10-07 NOTE — ED Notes (Signed)
Pt complains of abdominal pain x 1 day. Pt denies nausea vomiting or diarrhea at this time.

## 2012-10-07 NOTE — H&P (Addendum)
[facility] History and Physical  Zachary Harper ZOX:[id number] DOB: 08-Jun-1975 DOA: 10/07/2012  Referring physician: Murray Hodgkins, PA PCP: Provider Not In System  Specialists: none currently  Chief Complaint: N/V  HPI: Zachary Harper is a 37 y.o. AAM male, with known pancreatitis who this am 7/24 was getting ready to leave the house and became very stressed out due to multiple bills not being paid.  He states his stomach started to pain-he thought initially that he had gas-he felt a little better and left the house.  developed worsening pain in the middle of the abdomen which became ultimately more severe and hence was to the emergency room by his parents.  States that he drinks occasionally and had his last drink yesterday, 1 bottle of red apple ale.  No sick contacts. No ill contacts. No fever no chills. No cough no cold dizziness. No blurred vision, no double vision, no falls or weakness. States that he had 2 episodes of emesis today in the emergency room, both were nonbloody and appeared to look like phlegm. He has not eaten today, because he's had pain and has not tolerated by mouth. Patient had basic workup done, showing a white count of 11.4, potassium 3.3, lipase 235  Review of Systems:   Past Medical History  Diagnosis Date  . Hypertension    History reviewed. No pertinent past surgical history. Social History:  reports that he has been smoking Cigarettes.  He has been smoking about 1.00 pack per day. He has never used smokeless tobacco. He reports that  drinks alcohol. He reports that he does not use illicit drugs. Patient has a history of THC use. Patient drinks occasionally 1-7 beers every so often   No Known Allergies  No family history on file. patient's father has history of hypertension  Prior to Admission medications   Not on File   Physical Exam: Filed Vitals:   10/07/12 1856  BP: 139/82  Pulse: 72  Temp: 98.6 F (37 C)  TempSrc: Oral  Resp: 20   SpO2: 100%     General:  Alert, pleasant, oriented no apparent distress  Eyes: EOMI, NCAT  ENT: No lymphadenopathy, moderate dentition  Neck: Soft, supple  Cardiovascular: S1, S2 no murmur, rub, gallop  Respiratory: Clinically clear  Abdomen: Soft, nontender, no rebound no guarding  Skin: No rash  Musculoskeletal: Range of motion intact  Psychiatric: Euthymic  Neurologic: Grossly intact  Labs on Admission:  Basic Metabolic Panel:  Recent Labs Lab 10/07/12 2125  NA 140  K 3.3*  CL 101  CO2 27  GLUCOSE 90  BUN 15  CREATININE 0.80  CALCIUM 9.6   Liver Function Tests:  Recent Labs Lab 10/07/12 2125  AST 11  ALT 8  ALKPHOS 85  BILITOT 0.5  PROT 7.2  ALBUMIN 3.8    Recent Labs Lab 10/07/12 2125  LIPASE 235*   No results found for this basename: AMMONIA,  in the last 168 hours CBC:  Recent Labs Lab 10/07/12 2125  WBC 11.4*  NEUTROABS 9.4*  HGB 16.5  HCT 46.5  MCV 96.3  PLT 370   Cardiac Enzymes: No results found for this basename: CKTOTAL, CKMB, CKMBINDEX, TROPONINI,  in the last 168 hours  BNP (last 3 results) No results found for this basename: PROBNP,  in the last 8760 hours CBG: No results found for this basename: GLUCAP,  in the last 168 hours  Radiological Exams on Admission: No results found.  EKG: Independently reviewed. None performed  Assessment/Plan Active  Problems:   * No active hospital problems. *   1. Acute pancreatitis-mild. Patient will be kept n.p.o except for clear liquids, given IV fluids, and given pain management. I expect his lipase will continue to drop this may be repeated by rounding physician at the morning. He does not require CT scan of the abdomen is relatively stable appearing, and is toxic or ill. If however he started looking worse. Will reassess. 2. ? Reflux-will place on pantoprazole 40 daily, given his symptoms seeming to overlap with her 3. Hypokalemia-repeat basic metabolic panel in  a.m.-replaced if below 3.0 4. Volume depletion as evidenced by ketonuria and elevated specific gravity. does not have any kidney injury-see above 5. Reported history hypertension-stage I-monitor. Needs outpatient physician. Followup   Code Status: Full  unknown or must be presumed, indicate so) Family Communication: None at bedside (indicate person spoken with, if applicable, with phone number if by telephone) Disposition Plan: Observation, MedSurg  Time spent: 53  Mahala Menghini [facility] Pager [phone]  If 7PM-7AM, please contact night-coverage [email or web address] Password [facility] 10/07/2012, 11:24 PM

## 2012-10-08 ENCOUNTER — Encounter (HOSPITAL_COMMUNITY): Payer: Self-pay | Admitting: *Deleted

## 2012-10-08 DIAGNOSIS — E860 Dehydration: Secondary | ICD-10-CM

## 2012-10-08 DIAGNOSIS — D72829 Elevated white blood cell count, unspecified: Secondary | ICD-10-CM

## 2012-10-08 DIAGNOSIS — E876 Hypokalemia: Secondary | ICD-10-CM

## 2012-10-08 LAB — COMPREHENSIVE METABOLIC PANEL
ALT: 5 U/L (ref 0–53)
AST: 9 U/L (ref 0–37)
Albumin: 3 g/dL — ABNORMAL LOW (ref 3.5–5.2)
Alkaline Phosphatase: 66 U/L (ref 39–117)
GFR calc Af Amer: >90 mL/min (ref >90)
Glucose, Bld: 82 mg/dL (ref 70–99)
Potassium: 3.7 mEq/L (ref 3.5–5.1)
Sodium: 139 mEq/L (ref 135–145)
Total Protein: 5.7 g/dL — ABNORMAL LOW (ref 6.0–8.3)

## 2012-10-08 LAB — LIPID PANEL
Cholesterol: 111 mg/dL (ref 0–200)
HDL: 75 mg/dL (ref >39)
LDL Cholesterol: 25 mg/dL (ref 0–99)
Triglycerides: 53 mg/dL (ref <150)

## 2012-10-08 LAB — LIPASE, BLOOD: Lipase: 218 U/L — ABNORMAL HIGH (ref 11–59)

## 2012-10-08 MED ORDER — FLEET ENEMA 7-19 GM/118ML RE ENEM
1.0000 | ENEMA | Freq: Once | RECTAL | Status: AC
  Administered 2012-10-08: 1 via RECTAL
  Filled 2012-10-08: qty 1

## 2012-10-08 MED ORDER — HYDROMORPHONE HCL PF 1 MG/ML IJ SOLN
1.0000 mg | INTRAMUSCULAR | Status: DC | PRN
  Administered 2012-10-08: 1 mg via INTRAVENOUS
  Filled 2012-10-08: qty 1

## 2012-10-08 MED ORDER — MORPHINE SULFATE 2 MG/ML IJ SOLN
1.0000 mg | INTRAMUSCULAR | Status: DC | PRN
  Administered 2012-10-08 (×3): 1 mg via INTRAVENOUS
  Filled 2012-10-08 (×3): qty 1

## 2012-10-08 MED ORDER — ONDANSETRON HCL 4 MG/2ML IJ SOLN: 4.0000 mg | Freq: Three times a day (TID) | INTRAMUSCULAR | Status: DC | PRN

## 2012-10-08 MED ORDER — HYDROMORPHONE HCL PF 1 MG/ML IJ SOLN: 1.0000 mg | INTRAMUSCULAR | Status: DC | PRN

## 2012-10-08 MED ORDER — LORAZEPAM 1 MG PO TABS
1.0000 mg | ORAL_TABLET | Freq: Four times a day (QID) | ORAL | Status: DC | PRN
  Administered 2012-10-08: 1 mg via ORAL
  Filled 2012-10-08: qty 1

## 2012-10-08 MED ORDER — ADULT MULTIVITAMIN W/MINERALS CH
1.0000 | ORAL_TABLET | Freq: Every day | ORAL | Status: DC
  Administered 2012-10-08 – 2012-10-09 (×2): 1 via ORAL
  Filled 2012-10-08 (×2): qty 1

## 2012-10-08 MED ORDER — LORAZEPAM 2 MG/ML IJ SOLN: 1.0000 mg | Freq: Four times a day (QID) | INTRAMUSCULAR | Status: DC | PRN

## 2012-10-08 MED ORDER — OXYCODONE HCL 5 MG PO TABS
5.0000 mg | ORAL_TABLET | ORAL | Status: DC | PRN
  Administered 2012-10-08 (×2): 5 mg via ORAL
  Filled 2012-10-08 (×2): qty 1

## 2012-10-08 MED ORDER — SODIUM CHLORIDE 0.9 % IV SOLN
INTRAVENOUS | Status: DC
  Administered 2012-10-08 (×2): via INTRAVENOUS

## 2012-10-08 MED ORDER — THIAMINE HCL 100 MG/ML IJ SOLN
100.0000 mg | Freq: Every day | INTRAMUSCULAR | Status: DC
  Filled 2012-10-08 (×2): qty 1

## 2012-10-08 MED ORDER — SENNOSIDES-DOCUSATE SODIUM 8.6-50 MG PO TABS
2.0000 | ORAL_TABLET | Freq: Every evening | ORAL | Status: DC | PRN
  Administered 2012-10-08: 2 via ORAL
  Filled 2012-10-08 (×2): qty 2

## 2012-10-08 MED ORDER — ACETAMINOPHEN 325 MG PO TABS: 650.0000 mg | ORAL_TABLET | Freq: Four times a day (QID) | ORAL | Status: DC | PRN

## 2012-10-08 MED ORDER — FOLIC ACID 1 MG PO TABS
1.0000 mg | ORAL_TABLET | Freq: Every day | ORAL | Status: DC
  Administered 2012-10-08 – 2012-10-09 (×2): 1 mg via ORAL
  Filled 2012-10-08 (×2): qty 1

## 2012-10-08 MED ORDER — MORPHINE SULFATE 2 MG/ML IJ SOLN
1.0000 mg | INTRAMUSCULAR | Status: DC | PRN
  Administered 2012-10-08: 1 mg via INTRAVENOUS
  Filled 2012-10-08 (×2): qty 1

## 2012-10-08 MED ORDER — PROMETHAZINE HCL 25 MG PO TABS: 12.5000 mg | ORAL_TABLET | Freq: Four times a day (QID) | ORAL | Status: DC | PRN

## 2012-10-08 MED ORDER — VITAMIN B-1 100 MG PO TABS
100.0000 mg | ORAL_TABLET | Freq: Every day | ORAL | Status: DC
  Administered 2012-10-08 – 2012-10-09 (×2): 100 mg via ORAL
  Filled 2012-10-08 (×2): qty 1

## 2012-10-08 NOTE — ED Provider Notes (Signed)
Medical screening examination/treatment/procedure(s) were performed by non-physician practitioner and as supervising physician I was immediately available for consultation/collaboration.  Vincent Streater L Dicky Boer, MD 10/08/12 0006 

## 2012-10-08 NOTE — ED Notes (Signed)
Attempted to Call report, RN to accept report at this time.

## 2012-10-08 NOTE — Progress Notes (Addendum)
[facility] PROGRESS NOTE  Zachary Harper WUJ:[id number] DOB: [birth year] DOA: 10/07/2012 PCP: Joneen Roach, MD  Brief narrative 37 year old male with history of pancreatitis x1, polysubstance abuse-tobacco, alcohol and THC & HTN was admitted to the [facility] on 10/08/12 with complaints of nausea, vomiting and abdominal pain. In the ED, WBC 11.4, potassium 3.3 & lipase 235. Hospitalist admission requested for management of acute pancreatitis.  Assessment/Plan: 1. Acute pancreatitis: Likely related to alcohol abuse. LFTs unremarkable. Patient denies history of any new prescription medications. Reviewed CT abdomen results from 07/06/11-no comment about gallstones but there were no gallstones or biliary ductal dilatation. Lipase is mildly decreased and symptomatically better. Start clear liquid diet and advanced to a low-fat diet as tolerated. Counseled extensively regarding alcohol cessation-patient verbalized understanding. 2. Hypokalemia: Corrected. 3. Mild dehydration: Secondary to problem #1. Improved. Reduce IV fluids and start diet as above. 4. Mild leukocytosis: Likely secondary to problem #1. No clinical focus of sepsis. 5. Reported HTN: Not on medications at home. Controlled. 6. Polysubstance abuse-tobacco/THC/alcohol: Cessation counseled. Patient states that he drinks up to 4-5 times per week which includes beers and hard liquor. Place on Ativan protocol. No features of alcohol withdrawal at this time. 7. Tiny right lung base nodule: Seen on CT abdomen in April 2013-will need outpatient followup CT  Code Status: Full Family Communication: Discussed with patient's daughter at bedside. Disposition Plan: Home when medically stable-? 7/26   Consultants:  None  Procedures:  None  Antibiotics:  None   HPI/Subjective: Feels better. No further nausea or vomiting. Abdominal pain is better.  Objective: Filed Vitals:   10/07/12 1856 10/08/12 0031 10/08/12 0118  10/08/12 0534  BP: 139/82 138/80 130/84 118/75  Pulse: 72 66 58 57  Temp: 98.6 F (37 C) 98.1 F (36.7 C) 98.5 F (36.9 C) 98.5 F (36.9 C)  TempSrc: Oral Oral Oral Oral  Resp: 20 18 18 18   Height:   5' 9.5" (1.765 m)   Weight:   57.2 kg (126 lb 1.7 oz)   SpO2: 100% 100% 100% 98%    Intake/Output Summary (Last 24 hours) at 10/08/12 1309 Last data filed at 10/08/12 1210  Gross per 24 hour  Intake    320 ml  Output    475 ml  Net   -155 ml   Filed Weights   10/08/12 0118  Weight: 57.2 kg (126 lb 1.7 oz)    Exam:   General exam: Comfortable. Ambulating in room.  Respiratory system: Clear. No increased work of breathing.  Cardiovascular system: S1 & S2 heard, RRR. No JVD, murmurs, gallops, clicks or pedal edema. Telemetry: Sinus bradycardia in the 50s-sinus rhythm in the 60s.  Gastrointestinal system: Abdomen is nondistended, soft and nontender. Normal bowel sounds heard.  Central nervous system: Alert and oriented. No focal neurological deficits.  Extremities: Symmetric 5 x 5 power.   Data Reviewed: Basic Metabolic Panel:  Recent Labs Lab 10/07/12 2125 10/08/12 0430  NA 140 139  K 3.3* 3.7  CL 101 106  CO2 27 23  GLUCOSE 90 82  BUN 15 15  CREATININE 0.80 0.71  CALCIUM 9.6 8.6   Liver Function Tests:  Recent Labs Lab 10/07/12 2125 10/08/12 0430  AST 11 9  ALT 8 5  ALKPHOS 85 66  BILITOT 0.5 0.5  PROT 7.2 5.7*  ALBUMIN 3.8 3.0*    Recent Labs Lab 10/07/12 2125 10/08/12 0430  LIPASE 235* 218*   No results found for this basename: AMMONIA,  in the  last 168 hours CBC:  Recent Labs Lab 10/07/12 2125  WBC 11.4*  NEUTROABS 9.4*  HGB 16.5  HCT 46.5  MCV 96.3  PLT 370   Cardiac Enzymes: No results found for this basename: CKTOTAL, CKMB, CKMBINDEX, TROPONINI,  in the last 168 hours BNP (last 3 results) No results found for this basename: PROBNP,  in the last 8760 hours CBG: No results found for this basename: GLUCAP,  in the last 168  hours  No results found for this or any previous visit (from the past 240 hour(s)).   Studies: No results found.   Additional labs:   Scheduled Meds: . folic acid  1 mg Oral Daily  . multivitamin with minerals  1 tablet Oral Daily  . pantoprazole  40 mg Oral Daily  . thiamine  100 mg Oral Daily   Or  . thiamine  100 mg Intravenous Daily   Continuous Infusions: . sodium chloride 50 mL/hr at 10/08/12 1209    Principal Problem:   Acute pancreatitis Active Problems:   Leukocytosis   Dehydration   Hypokalemia    Time spent: 30 minutes.    [facility]  [facility] Pager [phone].   If 8PM-8AM, please contact night-coverage at [email or web address], password [email or web address] 10/08/2012, 1:09 PM  LOS: 1 day

## 2012-10-09 LAB — CBC
HCT: 44.9 % (ref 39.0–52.0)
Hemoglobin: 15.6 g/dL (ref 13.0–17.0)
MCH: 33.5 pg (ref 26.0–34.0)
MCHC: 34.7 g/dL (ref 30.0–36.0)
MCV: 96.4 fL (ref 78.0–100.0)
RBC: 4.66 MIL/uL (ref 4.22–5.81)

## 2012-10-09 MED ORDER — ACETAMINOPHEN 325 MG PO TABS: 650.0000 mg | ORAL_TABLET | Freq: Four times a day (QID) | ORAL | Status: DC | PRN

## 2012-10-09 MED ORDER — FOLIC ACID 1 MG PO TABS: 1.0000 mg | ORAL_TABLET | Freq: Every day | ORAL | Status: DC

## 2012-10-09 MED ORDER — ADULT MULTIVITAMIN W/MINERALS CH: 1.0000 | ORAL_TABLET | Freq: Every day | ORAL | Status: DC

## 2012-10-09 MED ORDER — THIAMINE HCL 100 MG PO TABS: 100.0000 mg | ORAL_TABLET | Freq: Every day | ORAL | Status: DC

## 2012-10-09 NOTE — Progress Notes (Signed)
Zachary Harper 1222 D/C'D HOME- D/C INSTRUCTIONS GIVEN WITH UNDERSTANDING. NO CHANGE IN PT'S CONDITION. SW D/C'D. NT WENT TO LOBBY WITH PATIENT.

## 2012-10-09 NOTE — Progress Notes (Signed)
Utilization review complete 

## 2012-10-09 NOTE — Discharge Summary (Signed)
Physician Discharge Summary  Zachary Harper ZOX:[id number] DOB: February 09, 1976 DOA: 10/07/2012  PCP: Joneen Roach, MD  Admit date: 10/07/2012 Discharge date: 10/09/2012  Time spent: Greater than 30 minutes  Recommendations for Outpatient Follow-up:  1. Dr. Duayne Cal, PCP in 1 week. 2. Recommend OP CT Chest to follow up on tiny right base lung nodule that was seen on CT Abdomen in April 2013.  Discharge Diagnoses:  Principal Problem:   Acute pancreatitis Active Problems:   Leukocytosis   Dehydration   Hypokalemia   Discharge Condition: Improved & Stable  Diet recommendation: Low fat diet.  Filed Weights   10/08/12 0118  Weight: 57.2 kg (126 lb 1.7 oz)    History of present illness:  37 year old male with history of pancreatitis x1, polysubstance abuse-tobacco, alcohol and THC & HTN was admitted to the [facility] on 10/08/12 with complaints of nausea, vomiting and abdominal pain. In the ED, WBC 11.4, potassium 3.3 & lipase 235. Hospitalist admission requested for management of acute pancreatitis.  Hospital Course:  1. Acute pancreatitis: Likely related to alcohol abuse. LFTs unremarkable. Patient denies history of any new prescription medications. Reviewed CT abdomen results from 07/06/11- there were no gallstones or biliary ductal dilatation. Lipase steadily improving and symptomatically better. Counseled extensively regarding alcohol cessation-patient verbalized understanding. Tolerating low-fat diet. Triglycerides normal. 2. Hypokalemia: Corrected.  3. Mild dehydration: Secondary to problem #1. Resolved after IV fluids.  4. Mild leukocytosis: Likely secondary to problem #1. No clinical focus of sepsis. Resolved. 5. Reported HTN: Not on medications at home. Controlled.  6. Polysubstance abuse-tobacco/THC/alcohol: Cessation counseled. Patient states that he drinks up to 4-5 times per week which includes beers and at times hard liquor. No features of alcohol  withdrawal at this time.  7. Tiny right lung base nodule: Seen on CT abdomen in April 2013-will need outpatient followup CT-patient counseled and he verbalized understanding. Tobacco cessation counseled.   Procedures:  None   Consultations:  None  Discharge Exam:  Complaints: Occasional mild abdominal pain. No nausea, vomiting or diarrhea. Tolerating diet.  Filed Vitals:   10/08/12 1600 10/08/12 2339 10/09/12 0527 10/09/12 0800  BP: 130/66 138/88 136/74 134/89  Pulse: 70 61 70 61  Temp:  98.4 F (36.9 C) 98.1 F (36.7 C)   TempSrc:  Oral Oral   Resp:  18 18   Height:      Weight:      SpO2:  100% 100%      General exam: Comfortable.  Respiratory system: Clear. No increased work of breathing.  Cardiovascular system: S1 and S2 heard, RRR. No JVD, murmurs or pedal edema.  Gastrointestinal system: Abdomen is nondistended, soft and nontender. Normal bowel sounds heard.  Central nervous system: Alert and oriented. No focal neurological deficits.  Extremities: Symmetric 5 x 5 power.  Discharge Instructions      Discharge Orders   Future Orders Complete By Expires     Call MD for:  persistant nausea and vomiting  As directed     Call MD for:  severe uncontrolled pain  As directed     Call MD for:  temperature >100.4  As directed     Discharge instructions  As directed     Comments:      Diet: Low fat diet.    Increase activity slowly  As directed         Medication List         acetaminophen 325 MG tablet  Commonly known as:  TYLENOL  Take 2 tablets (650 mg total) by mouth every 6 (six) hours as needed for pain.     folic acid 1 MG tablet  Commonly known as:  FOLVITE  Take 1 tablet (1 mg total) by mouth daily.     multivitamin with minerals Tabs  Take 1 tablet by mouth daily.     thiamine 100 MG tablet  Take 1 tablet (100 mg total) by mouth daily.       Follow-up Information   Follow up with Joneen Roach, MD. Schedule an appointment as soon  as possible for a visit in 1 week. (Recommend out patient CT chest to follow up tiny right lung base nodule that was seen on CT Abdomen in April 2013. )    Contact information:   [address] [place] [address] [phone]        The results of significant diagnostics from this hospitalization (including imaging, microbiology, ancillary and laboratory) are listed below for reference.    Significant Diagnostic Studies: No results found.  Microbiology: No results found for this or any previous visit (from the past 240 hour(s)).   Labs: Basic Metabolic Panel:  Recent Labs Lab 10/07/12 2125 10/08/12 0430  NA 140 139  K 3.3* 3.7  CL 101 106  CO2 27 23  GLUCOSE 90 82  BUN 15 15  CREATININE 0.80 0.71  CALCIUM 9.6 8.6   Liver Function Tests:  Recent Labs Lab 10/07/12 2125 10/08/12 0430  AST 11 9  ALT 8 5  ALKPHOS 85 66  BILITOT 0.5 0.5  PROT 7.2 5.7*  ALBUMIN 3.8 3.0*    Recent Labs Lab 10/07/12 2125 10/08/12 0430 10/09/12 0450  LIPASE 235* 218* 146*   No results found for this basename: AMMONIA,  in the last 168 hours CBC:  Recent Labs Lab 10/07/12 2125 10/09/12 0450  WBC 11.4* 8.8  NEUTROABS 9.4*  --   HGB 16.5 15.6  HCT 46.5 44.9  MCV 96.3 96.4  PLT 370 330   Cardiac Enzymes: No results found for this basename: CKTOTAL, CKMB, CKMBINDEX, TROPONINI,  in the last 168 hours BNP: BNP (last 3 results) No results found for this basename: PROBNP,  in the last 8760 hours CBG: No results found for this basename: GLUCAP,  in the last 168 hours  Additional labs:  Lipid panel: Cholesterol 111, triglycerides 53, HDL 75, LDL 25, VLDL 11    Signed:  Icey Tello  [facility] 10/09/2012, 10:27 AM

## 2012-11-11 ENCOUNTER — Encounter (HOSPITAL_COMMUNITY): Payer: Self-pay | Admitting: Emergency Medicine

## 2012-11-11 ENCOUNTER — Emergency Department (HOSPITAL_COMMUNITY)
Admission: EM | Admit: 2012-11-11 | Discharge: 2012-11-11 | Disposition: A | Discharge status: Home / Self Care | Payer: Medicaid Other | Attending: Emergency Medicine | Admitting: Emergency Medicine

## 2012-11-11 DIAGNOSIS — F17200 Nicotine dependence, unspecified, uncomplicated: Secondary | ICD-10-CM | POA: Insufficient documentation

## 2012-11-11 DIAGNOSIS — I10 Essential (primary) hypertension: Secondary | ICD-10-CM | POA: Insufficient documentation

## 2012-11-11 DIAGNOSIS — Z79899 Other long term (current) drug therapy: Secondary | ICD-10-CM | POA: Insufficient documentation

## 2012-11-11 DIAGNOSIS — Z8719 Personal history of other diseases of the digestive system: Secondary | ICD-10-CM | POA: Insufficient documentation

## 2012-11-11 DIAGNOSIS — K047 Periapical abscess without sinus: Secondary | ICD-10-CM | POA: Insufficient documentation

## 2012-11-11 HISTORY — DX: Acute pancreatitis without necrosis or infection, unspecified: K85.90

## 2012-11-11 MED ORDER — PENICILLIN V POTASSIUM 500 MG PO TABS: 500.0000 mg | ORAL_TABLET | Freq: Four times a day (QID) | ORAL | Status: DC

## 2012-11-11 MED ORDER — IBUPROFEN 800 MG PO TABS: 800.0000 mg | ORAL_TABLET | Freq: Three times a day (TID) | ORAL | Status: DC | PRN

## 2012-11-11 MED ORDER — PENICILLIN V POTASSIUM 250 MG PO TABS
500.0000 mg | ORAL_TABLET | Freq: Once | ORAL | Status: AC
  Administered 2012-11-11: 500 mg via ORAL
  Filled 2012-11-11: qty 2

## 2012-11-11 MED ORDER — IBUPROFEN 400 MG PO TABS
800.0000 mg | ORAL_TABLET | Freq: Once | ORAL | Status: AC
  Administered 2012-11-11: 800 mg via ORAL
  Filled 2012-11-11: qty 2

## 2012-11-11 NOTE — ED Notes (Signed)
Pt discharged.Vital signs stable and GCS 15 

## 2012-11-11 NOTE — ED Notes (Signed)
PT. REPORTS RIGHT UPPER MOLAR PAIN FOR SEVERAL DAYS .

## 2012-11-11 NOTE — ED Provider Notes (Signed)
CSN: [id number]     Arrival date & time 11/11/12  2048 History   First MD Initiated Contact with Patient 11/11/12 2157     Chief Complaint  Patient presents with  . Dental Pain   (Consider location/radiation/quality/duration/timing/severity/associated sxs/prior Treatment) HPI Comments: Patient with poor dentition, and many missing teeth.  Now presents with an abscess between the upper first molar in the upper third molar on the right, with obvious abscess of the gum space. He has been soaking the area, and garlic.  He does have a dentist, but has not made an appointment as of yet  Patient is a 37 y.o. male presenting with tooth pain. The history is provided by the patient.  Dental Pain Location:  Upper Upper teeth location: gum. Quality:  Pulsating Severity:  Moderate Onset quality:  Gradual Duration:  2 days Timing:  Constant Progression:  Worsening Chronicity:  New Context: abscess   Associated symptoms: no fever and no headaches     Past Medical History  Diagnosis Date  . Hypertension   . Pancreatitis    History reviewed. No pertinent past surgical history. No family history on file. History  Substance Use Topics  . Smoking status: Current Every Day Smoker -- 1.00 packs/day    Types: Cigarettes  . Smokeless tobacco: Never Used  . Alcohol Use: 0.0 oz/week    12-24 Shots of liquor per week     Comment: daily; (08/22/11-reports drinking a fifth in last week, had not drank since getting out of hospital in april    Review of Systems  Constitutional: Negative for fever.  HENT: Positive for dental problem.   Neurological: Negative for dizziness and headaches.  All other systems reviewed and are negative.    Allergies  Review of patient's allergies indicates no known allergies.  Home Medications   Current Outpatient Rx  Name  Route  Sig  Dispense  Refill  . acetaminophen (TYLENOL) 325 MG tablet   Oral   Take 2 tablets (650 mg total) by mouth every 6 (six) hours as  needed for pain.         . folic acid (FOLVITE) 1 MG tablet   Oral   Take 1 tablet (1 mg total) by mouth daily.   30 tablet   0   . Multiple Vitamin (MULTIVITAMIN WITH MINERALS) TABS   Oral   Take 1 tablet by mouth daily.         Marland Kitchen thiamine 100 MG tablet   Oral   Take 1 tablet (100 mg total) by mouth daily.   30 tablet   0    BP 112/86  Pulse 85  Temp(Src) 99.3 F (37.4 C) (Oral)  Resp 14  SpO2 97% Physical Exam  Nursing note and vitals reviewed. Constitutional: He appears well-nourished.  HENT:  Head: Normocephalic.  Mouth/Throat: Oropharynx is clear and moist.    Abscess that was very fluctuant, ruptured spontaneously, with slight pressure  Cardiovascular: Normal rate and regular rhythm.   Pulmonary/Chest: Effort normal and breath sounds normal.  Musculoskeletal: Normal range of motion.  Lymphadenopathy:    He has no cervical adenopathy.  Neurological: He is alert.  Skin: Skin is warm.    ED Course  Procedures (including critical care time) Labs Review Labs Reviewed - No data to display Imaging Review No results found.  MDM  No diagnosis found.  Abscess that ruptured with gentle pressure.  Will start patient on penicillin 4 times a day, ibuprofen, R. radial basis.  He is  to call his dentist on Tuesday    Arman Filter, NP 11/11/12 2329

## 2012-11-12 NOTE — ED Provider Notes (Signed)
Medical screening examination/treatment/procedure(s) were performed by non-physician practitioner and as supervising physician I was immediately available for consultation/collaboration.  Arnold Kester, MD 11/12/12 0029 

## 2013-03-02 ENCOUNTER — Emergency Department (HOSPITAL_COMMUNITY)
Admission: EM | Admit: 2013-03-02 | Discharge: 2013-03-02 | Disposition: A | Discharge status: Home / Self Care | Payer: Medicaid Other | Attending: Emergency Medicine | Admitting: Emergency Medicine

## 2013-03-02 ENCOUNTER — Emergency Department (HOSPITAL_COMMUNITY): Payer: Medicaid Other

## 2013-03-02 ENCOUNTER — Encounter (HOSPITAL_COMMUNITY): Payer: Self-pay | Admitting: Emergency Medicine

## 2013-03-02 DIAGNOSIS — Y92410 Unspecified street and highway as the place of occurrence of the external cause: Secondary | ICD-10-CM | POA: Insufficient documentation

## 2013-03-02 DIAGNOSIS — F17200 Nicotine dependence, unspecified, uncomplicated: Secondary | ICD-10-CM | POA: Insufficient documentation

## 2013-03-02 DIAGNOSIS — S7000XA Contusion of unspecified hip, initial encounter: Secondary | ICD-10-CM | POA: Insufficient documentation

## 2013-03-02 DIAGNOSIS — I10 Essential (primary) hypertension: Secondary | ICD-10-CM | POA: Insufficient documentation

## 2013-03-02 DIAGNOSIS — Z79899 Other long term (current) drug therapy: Secondary | ICD-10-CM | POA: Insufficient documentation

## 2013-03-02 DIAGNOSIS — S8000XA Contusion of unspecified knee, initial encounter: Secondary | ICD-10-CM | POA: Insufficient documentation

## 2013-03-02 DIAGNOSIS — Z8719 Personal history of other diseases of the digestive system: Secondary | ICD-10-CM | POA: Insufficient documentation

## 2013-03-02 DIAGNOSIS — S46909A Unspecified injury of unspecified muscle, fascia and tendon at shoulder and upper arm level, unspecified arm, initial encounter: Secondary | ICD-10-CM | POA: Insufficient documentation

## 2013-03-02 DIAGNOSIS — R209 Unspecified disturbances of skin sensation: Secondary | ICD-10-CM | POA: Insufficient documentation

## 2013-03-02 DIAGNOSIS — S79919A Unspecified injury of unspecified hip, initial encounter: Secondary | ICD-10-CM | POA: Insufficient documentation

## 2013-03-02 DIAGNOSIS — T07XXXA Unspecified multiple injuries, initial encounter: Secondary | ICD-10-CM

## 2013-03-02 DIAGNOSIS — Y9389 Activity, other specified: Secondary | ICD-10-CM | POA: Insufficient documentation

## 2013-03-02 DIAGNOSIS — S4980XA Other specified injuries of shoulder and upper arm, unspecified arm, initial encounter: Secondary | ICD-10-CM | POA: Insufficient documentation

## 2013-03-02 MED ORDER — TRAMADOL HCL 50 MG PO TABS: 50.0000 mg | ORAL_TABLET | Freq: Four times a day (QID) | ORAL | Status: DC | PRN

## 2013-03-02 MED ORDER — CYCLOBENZAPRINE HCL 10 MG PO TABS: 10.0000 mg | ORAL_TABLET | Freq: Two times a day (BID) | ORAL | Status: DC | PRN
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
## 2013-03-03 NOTE — ED Provider Notes (Signed)
Medical screening examination/treatment/procedure(s) were performed by non-physician practitioner and as supervising physician I was immediately available for consultation/collaboration.  EKG Interpretation   None         Laray Anger, DO 03/03/13 1430

## 2014-09-22 ENCOUNTER — Emergency Department
Admission: EM | Admit: 2014-09-22 | Discharge: 2014-09-22 | Disposition: A | Discharge status: Home / Self Care | Payer: BLUE CROSS/BLUE SHIELD | Attending: Emergency Medicine | Admitting: Emergency Medicine

## 2014-09-22 ENCOUNTER — Encounter: Payer: Self-pay | Admitting: Emergency Medicine

## 2014-09-22 DIAGNOSIS — L02415 Cutaneous abscess of right lower limb: Secondary | ICD-10-CM | POA: Diagnosis not present

## 2014-09-22 DIAGNOSIS — I10 Essential (primary) hypertension: Secondary | ICD-10-CM | POA: Insufficient documentation

## 2014-09-22 DIAGNOSIS — Z720 Tobacco use: Secondary | ICD-10-CM | POA: Diagnosis not present

## 2014-09-22 MED ORDER — HYDROCODONE-ACETAMINOPHEN 5-325 MG PO TABS: 1.0000 | ORAL_TABLET | ORAL | Status: DC | PRN

## 2014-09-22 MED ORDER — SULFAMETHOXAZOLE-TRIMETHOPRIM 800-160 MG PO TABS: 1.0000 | ORAL_TABLET | Freq: Two times a day (BID) | ORAL | Status: DC

## 2014-09-22 NOTE — ED Provider Notes (Signed)
[facility] Emergency Department Provider Note  ____________________________________________  Time seen:  8:40 AM  I have reviewed the triage vital signs and the nursing notes.   HISTORY  Chief Complaint Abscess   HPI [name] is a 39 y.o. male is here with complaint of abscess to his thigh.  Patient states it is constant  for couple days. He has not had anything like this before. Area has gotten slightly larger in the last 24 hours. He has not seen any drainage. He denies any fever, nausea or vomiting. He has not taken any medication over-the-counter. Currently he states his pain is 9/10.   Past Medical History  Diagnosis Date  . Hypertension   . Pancreatitis     Patient Active Problem List   Diagnosis Date Noted  . Hypokalemia 07/09/2011  . Alcohol withdrawal delirium 07/06/2011  . Vomiting 07/06/2011  . Dehydration 07/06/2011  . Acute alcohol intoxication 07/05/2011  . Leukocytosis 07/05/2011  . HTN (hypertension) 07/05/2011  . Acute pancreatitis 07/05/2011    History reviewed. No pertinent past surgical history.  Current Outpatient Rx  Name  Route  Sig  Dispense  Refill  . HYDROcodone-acetaminophen (NORCO/VICODIN) 5-325 MG per tablet   Oral   Take 1 tablet by mouth every 4 (four) hours as needed for moderate pain.   20 tablet   0   . sulfamethoxazole-trimethoprim (BACTRIM DS,SEPTRA DS) 800-160 MG per tablet   Oral   Take 1 tablet by mouth 2 (two) times daily.   20 tablet   0     Allergies Review of patient's allergies indicates no known allergies.  No family history on file.  Social History History  Substance Use Topics  . Smoking status: Current Every Day Smoker -- 1.00 packs/day    Types: Cigarettes  . Smokeless tobacco: Never Used  . Alcohol Use: 0.0 oz/week    12-24 Shots of liquor per week     Comment: daily; (08/22/11-reports drinking a fifth in last week, had not drank since getting out of hospital in april     Review of Systems Constitutional: No fever/chills Eyes: No visual changes. ENT: No sore throat. Cardiovascular: Denies chest pain. Respiratory: Denies shortness of breath. Gastrointestinal: No abdominal pain.  No nausea, no vomiting. . Genitourinary: Negative for dysuria. Musculoskeletal: Negative for back pain. Skin: Negative for rash. Positive for abscess Neurological: Negative for headaches 10-point ROS otherwise negative.  ____________________________________________   PHYSICAL EXAM:  VITAL SIGNS: ED Triage Vitals  Enc Vitals Group     BP 09/22/14 0818 107/93 mmHg     Pulse Rate 09/22/14 0818 90     Resp 09/22/14 0818 18     Temp 09/22/14 0818 98 F (36.7 C)     Temp Source 09/22/14 0818 Oral     SpO2 09/22/14 0818 99 %     Weight 09/22/14 0818 125 lb (56.7 kg)     Height 09/22/14 0818 5\' 10"  (1.778 m)     Head Cir --      Peak Flow --      Pain Score 09/22/14 0819 8     Pain Loc --      Pain Edu? --      Excl. in [place]? --     Constitutional: Alert and oriented. Well appearing and in no acute distress. Eyes: Conjunctivae are normal. PERRL. EOMI. Head: Atraumatic. Nose: No congestion/rhinnorhea. .Neck: No stridor.   Cardiovascular: Normal rate, regular rhythm. Grossly normal heart sounds.  Good peripheral circulation. Respiratory:  Normal respiratory effort.  No retractions. Lungs CTAB. Gastrointestinal: Soft and nontender. No distention. Musculoskeletal: No lower extremity tenderness nor edema.  No joint effusions. Neurologic:  Normal speech and language. No gross focal neurologic deficits are appreciated. Speech is normal. No gait instability. Skin:  Skin is warm, dry and intact. No rash noted. There is a small 1.5 cm hard, tender abscess to the inner right 5. Currently there is no erythema. No drainage. Psychiatric: Mood and affect are normal. Speech and behavior are normal.  ____________________________________________   LABS (all labs ordered are  listed, but only abnormal results are displayed)  Labs Reviewed - No data to display _ PROCEDURES  Procedure(s) performed: None  Critical Care performed: No  ____________________________________________   INITIAL IMPRESSION / ASSESSMENT AND PLAN / ED COURSE  Pertinent labs & imaging results that were available during my care of the patient were reviewed by me and considered in my medical decision making (see chart for details).  Patient was started on Septra DS for 10 days along with Norco if needed for pain. He is instructed to use warm moist compresses to the area frequently. He is return to the emergency room if any softening of this area so that we can I&D it. ____________________________________________   FINAL CLINICAL IMPRESSION(S) / ED DIAGNOSES  Final diagnoses:  Abscess of right thigh      Zachary Hai, PA-C 09/22/14 [place], MD 09/22/14 1425

## 2014-09-22 NOTE — ED Notes (Signed)
Pt reports possible abscess to right side groin area.

## 2014-09-22 NOTE — Discharge Instructions (Signed)
Abscess °An abscess (boil or furuncle) is an infected area on or under the skin. This area is filled with yellowish-white fluid (pus) and other material (debris). °HOME CARE  °· Only take medicines as told by your doctor. °· If you were given antibiotic medicine, take it as directed. Finish the medicine even if you start to feel better. °· If gauze is used, follow your doctor's directions for changing the gauze. °· To avoid spreading the infection: °¨ Keep your abscess covered with a bandage. °¨ Wash your hands well. °¨ Do not share personal care items, towels, or whirlpools with others. °¨ Avoid skin contact with others. °· Keep your skin and clothes clean around the abscess. °· Keep all doctor visits as told. °GET HELP RIGHT AWAY IF:  °· You have more pain, puffiness (swelling), or redness in the wound site. °· You have more fluid or blood coming from the wound site. °· You have muscle aches, chills, or you feel sick. °· You have a fever. °MAKE SURE YOU:  °· Understand these instructions. °· Will watch your condition. °· Will get help right away if you are not doing well or get worse. °Document Released: 08/20/2007 Document Revised: 09/02/2011 Document Reviewed: 05/16/2011 °[employer]® Patient Information ©2015 [employer]. This information is not intended to replace advice given to you by your health care provider. Make sure you discuss any questions you have with your health care provider. ° °

## 2014-09-22 NOTE — ED Notes (Signed)
Past few days c/o small abcess to right inner thigh

## 2015-08-01 ENCOUNTER — Emergency Department (HOSPITAL_COMMUNITY)
Admission: EM | Admit: 2015-08-01 | Discharge: 2015-08-01 | Disposition: A | Discharge status: Home / Self Care | Payer: BLUE CROSS/BLUE SHIELD | Attending: Emergency Medicine | Admitting: Emergency Medicine

## 2015-08-01 ENCOUNTER — Encounter (HOSPITAL_COMMUNITY): Payer: Self-pay | Admitting: Emergency Medicine

## 2015-08-01 DIAGNOSIS — F17210 Nicotine dependence, cigarettes, uncomplicated: Secondary | ICD-10-CM | POA: Insufficient documentation

## 2015-08-01 DIAGNOSIS — K859 Acute pancreatitis without necrosis or infection, unspecified: Secondary | ICD-10-CM | POA: Insufficient documentation

## 2015-08-01 DIAGNOSIS — I10 Essential (primary) hypertension: Secondary | ICD-10-CM | POA: Insufficient documentation

## 2015-08-01 DIAGNOSIS — R1084 Generalized abdominal pain: Secondary | ICD-10-CM | POA: Diagnosis present

## 2015-08-01 LAB — CBC WITH DIFFERENTIAL/PLATELET
Basophils Absolute: 0 10*3/uL (ref 0.0–0.1)
Basophils Relative: 0 %
EOS ABS: 0 10*3/uL (ref 0.0–0.7)
EOS PCT: 0 %
HCT: 48.1 % (ref 39.0–52.0)
Hemoglobin: 17.3 g/dL — ABNORMAL HIGH (ref 13.0–17.0)
LYMPHS ABS: 0.9 10*3/uL (ref 0.7–4.0)
LYMPHS PCT: 8 %
MCH: 35.5 pg — AB (ref 26.0–34.0)
MCHC: 36 g/dL (ref 30.0–36.0)
MCV: 98.8 fL (ref 78.0–100.0)
MONO ABS: 0.9 10*3/uL (ref 0.1–1.0)
MONOS PCT: 7 %
Neutro Abs: 9.7 10*3/uL — ABNORMAL HIGH (ref 1.7–7.7)
Neutrophils Relative %: 85 %
PLATELETS: 403 10*3/uL — AB (ref 150–400)
RBC: 4.87 MIL/uL (ref 4.22–5.81)
RDW: 12.4 % (ref 11.5–15.5)
WBC: 11.5 10*3/uL — AB (ref 4.0–10.5)

## 2015-08-01 LAB — COMPREHENSIVE METABOLIC PANEL
ALK PHOS: 79 U/L (ref 38–126)
ALT: 16 U/L — ABNORMAL LOW (ref 17–63)
AST: 19 U/L (ref 15–41)
Albumin: 4 g/dL (ref 3.5–5.0)
Anion gap: 18 — ABNORMAL HIGH (ref 5–15)
BUN: 9 mg/dL (ref 6–20)
CALCIUM: 9.9 mg/dL (ref 8.9–10.3)
CHLORIDE: 95 mmol/L — AB (ref 101–111)
CO2: 24 mmol/L (ref 22–32)
CREATININE: 1.08 mg/dL (ref 0.61–1.24)
GFR calc non Af Amer: >60 mL/min (ref >60)
GLUCOSE: 87 mg/dL (ref 65–99)
Potassium: 3.8 mmol/L (ref 3.5–5.1)
SODIUM: 137 mmol/L (ref 135–145)
Total Bilirubin: 1.1 mg/dL (ref 0.3–1.2)
Total Protein: 7.3 g/dL (ref 6.5–8.1)

## 2015-08-01 LAB — URINALYSIS, ROUTINE W REFLEX MICROSCOPIC
GLUCOSE, UA: NEGATIVE mg/dL
HGB URINE DIPSTICK: NEGATIVE
Ketones, ur: >80 mg/dL — AB
Leukocytes, UA: NEGATIVE
Nitrite: NEGATIVE
PH: 6 (ref 5.0–8.0)
Protein, ur: 30 mg/dL — AB
SPECIFIC GRAVITY, URINE: 1.026 (ref 1.005–1.030)

## 2015-08-01 LAB — URINE MICROSCOPIC-ADD ON: BACTERIA UA: NONE SEEN

## 2015-08-01 LAB — I-STAT TROPONIN, ED: TROPONIN I, POC: 0 ng/mL (ref 0.00–0.08)

## 2015-08-01 LAB — LIPASE, BLOOD: Lipase: 262 U/L — ABNORMAL HIGH (ref 11–51)

## 2015-08-01 LAB — I-STAT CG4 LACTIC ACID, ED: LACTIC ACID, VENOUS: 1.32 mmol/L (ref 0.5–2.0)

## 2015-08-01 MED ORDER — ONDANSETRON 4 MG PO TBDP: 4.0000 mg | ORAL_TABLET | Freq: Three times a day (TID) | ORAL | Status: DC | PRN

## 2015-08-01 MED ORDER — MORPHINE SULFATE (PF) 4 MG/ML IV SOLN
4.0000 mg | Freq: Once | INTRAVENOUS | Status: AC
  Administered 2015-08-01: 4 mg via INTRAVENOUS
  Filled 2015-08-01: qty 1

## 2015-08-01 MED ORDER — MORPHINE SULFATE (PF) 4 MG/ML IV SOLN
4.0000 mg | Freq: Once | INTRAVENOUS | Status: DC
  Filled 2015-08-01: qty 1

## 2015-08-01 MED ORDER — SODIUM CHLORIDE 0.9 % IV BOLUS (SEPSIS)
1000.0000 mL | Freq: Once | INTRAVENOUS | Status: AC
Start: 2015-08-01 — End: 2015-08-01
  Administered 2015-08-01: 1000 mL via INTRAVENOUS

## 2015-08-01 MED ORDER — ONDANSETRON HCL 4 MG/2ML IJ SOLN
4.0000 mg | Freq: Once | INTRAMUSCULAR | Status: AC
  Administered 2015-08-01: 4 mg via INTRAVENOUS
  Filled 2015-08-01: qty 2

## 2015-08-01 MED ORDER — SODIUM CHLORIDE 0.9 % IV BOLUS (SEPSIS)
1000.0000 mL | Freq: Once | INTRAVENOUS | Status: AC
  Administered 2015-08-01: 1000 mL via INTRAVENOUS

## 2015-08-01 MED ORDER — GI COCKTAIL ~~LOC~~
30.0000 mL | Freq: Once | ORAL | Status: AC
  Administered 2015-08-01: 30 mL via ORAL
  Filled 2015-08-01: qty 30

## 2015-08-01 MED ORDER — MORPHINE SULFATE (PF) 4 MG/ML IV SOLN
4.0000 mg | Freq: Once | INTRAVENOUS | Status: AC
  Administered 2015-08-01: 4 mg via INTRAMUSCULAR

## 2015-08-01 MED ORDER — OXYCODONE HCL 5 MG PO TABS: 5.0000 mg | ORAL_TABLET | ORAL | Status: DC | PRN

## 2015-08-01 NOTE — ED Provider Notes (Signed)
CSN: [id number]     Arrival date & time 08/01/15  [id number] History   First MD Initiated Contact with Patient 08/01/15 [phone]     Chief Complaint  Patient presents with  . Abdominal Pain     (Consider location/radiation/quality/duration/timing/severity/associated sxs/prior Treatment) Patient is a 40 y.o. male presenting with abdominal pain. The history is provided by the patient.  Abdominal Pain Pain location:  Generalized Pain quality: sharp   Pain radiates to:  Back Pain severity:  Moderate Onset quality:  Sudden Timing:  Constant Progression:  Unchanged Chronicity:  New Relieved by:  Nothing Worsened by:  Nothing tried Ineffective treatments:  None tried Associated symptoms: nausea and vomiting   Associated symptoms: no chest pain, no chills, no constipation, no diarrhea, no fever and no shortness of breath    40 yo M With a chief complaint of diffuse abdominal pain. The started yesterday. Crampy comes and goes associated with vomiting. Patient states he's had symptoms like this before when he had pancreatitis. He needs to his back. Having a mild headache difficulty eating or drinking at home. Denies prior abdominal surgeries.  Past Medical History  Diagnosis Date  . Hypertension   . Pancreatitis    History reviewed. No pertinent past surgical history. History reviewed. No pertinent family history. Social History  Substance Use Topics  . Smoking status: Current Every Day Smoker -- 1.00 packs/day    Types: Cigarettes  . Smokeless tobacco: Never Used  . Alcohol Use: 0.0 oz/week    12-24 Shots of liquor per week     Comment: daily; (08/22/11-reports drinking a fifth in last week, had not drank since getting out of hospital in april    Review of Systems  Constitutional: Negative for fever and chills.  HENT: Negative for congestion and facial swelling.   Eyes: Negative for discharge and visual disturbance.  Respiratory: Negative for shortness of breath.   Cardiovascular:  Negative for chest pain and palpitations.  Gastrointestinal: Positive for nausea, vomiting and abdominal pain. Negative for diarrhea and constipation.  Musculoskeletal: Negative for myalgias and arthralgias.  Skin: Negative for color change and rash.  Neurological: Negative for tremors, syncope and headaches.  Psychiatric/Behavioral: Negative for confusion and dysphoric mood.      Allergies  Review of patient's allergies indicates no known allergies.  Home Medications   Prior to Admission medications   Medication Sig Start Date End Date Taking? Authorizing Provider  ibuprofen (ADVIL,MOTRIN) 200 MG tablet Take 200 mg by mouth every 6 (six) hours as needed for moderate pain.   Yes Historical Provider, MD  HYDROcodone-acetaminophen (NORCO/VICODIN) 5-325 MG per tablet Take 1 tablet by mouth every 4 (four) hours as needed for moderate pain. Patient not taking: Reported on 08/01/2015 09/22/14   Johnn Hai, PA-C  ondansetron (ZOFRAN ODT) 4 MG disintegrating tablet Take 1 tablet (4 mg total) by mouth every 8 (eight) hours as needed for nausea or vomiting. 08/01/15   Deno Etienne, DO  oxyCODONE (ROXICODONE) 5 MG immediate release tablet Take 1 tablet (5 mg total) by mouth every 4 (four) hours as needed for severe pain. 08/01/15   Deno Etienne, DO  sulfamethoxazole-trimethoprim (BACTRIM DS,SEPTRA DS) 800-160 MG per tablet Take 1 tablet by mouth 2 (two) times daily. Patient not taking: Reported on 08/01/2015 09/22/14   Johnn Hai, PA-C   BP 139/91 mmHg  Pulse 63  Temp(Src) 97.9 F (36.6 C) (Oral)  Resp 16  Ht 5\' 10"  (1.778 m)  Wt 125 lb (56.7 kg)  BMI  17.94 kg/m2  SpO2 100% Physical Exam  Constitutional: He is oriented to person, place, and time. He appears well-developed and well-nourished.  HENT:  Head: Normocephalic and atraumatic.  Eyes: EOM are normal. Pupils are equal, round, and reactive to light.  Neck: Normal range of motion. Neck supple. No JVD present.  Cardiovascular: Normal  rate and regular rhythm.  Exam reveals no gallop and no friction rub.   No murmur heard. Pulmonary/Chest: No respiratory distress. He has no wheezes.  Abdominal: He exhibits no distension. There is no tenderness (negative muprhys). There is no rebound and no guarding.  Musculoskeletal: Normal range of motion.  Neurological: He is alert and oriented to person, place, and time.  Skin: No rash noted. No pallor.  Psychiatric: He has a normal mood and affect. His behavior is normal.  Nursing note and vitals reviewed.   ED Course  Procedures (including critical care time) Labs Review Labs Reviewed  COMPREHENSIVE METABOLIC PANEL - Abnormal; Notable for the following:    Chloride 95 (*)    ALT 16 (*)    Anion gap 18 (*)    All other components within normal limits  LIPASE, BLOOD - Abnormal; Notable for the following:    Lipase 262 (*)    All other components within normal limits  URINALYSIS, ROUTINE W REFLEX MICROSCOPIC (NOT AT [facility]) - Abnormal; Notable for the following:    Color, Urine AMBER (*)    Bilirubin Urine SMALL (*)    Ketones, ur >80 (*)    Protein, ur 30 (*)    All other components within normal limits  CBC WITH DIFFERENTIAL/PLATELET - Abnormal; Notable for the following:    WBC 11.5 (*)    Hemoglobin 17.3 (*)    MCH 35.5 (*)    Platelets 403 (*)    Neutro Abs 9.7 (*)    All other components within normal limits  URINE MICROSCOPIC-ADD ON - Abnormal; Notable for the following:    Squamous Epithelial / LPF 0-5 (*)    Casts WBC CAST (*)    All other components within normal limits  CBC WITH DIFFERENTIAL/PLATELET  I-STAT CG4 LACTIC ACID, ED  I-STAT TROPOININ, ED  I-STAT CG4 LACTIC ACID, ED    Imaging Review No results found. I have personally reviewed and evaluated these images and lab results as part of my medical decision-making.   EKG Interpretation None      MDM   Final diagnoses:  Acute pancreatitis, unspecified pancreatitis type    40 yo M With  diffuse abdominal pain. Nonfocal abdominal exam. Will treat with IV fluids nausea and pain medicine. GI cocktail. Laboratory evaluation and reassess.  Found to have pancreatitis.  Symptoms improved.  D/c home. PCP follow up.     I have discussed the diagnosis/risks/treatment options with the patient and family and believe the pt to be eligible for discharge home to follow-up with PCP. We also discussed returning to the ED immediately if new or worsening sx occur. We discussed the sx which are most concerning (e.g., sudden worsening pain, fever, inability to tolerate by mouth) that necessitate immediate return. Medications administered to the patient during their visit and any new prescriptions provided to the patient are listed below.  Medications given during this visit Medications  sodium chloride 0.9 % bolus 1,000 mL (0 mLs Intravenous Stopped 08/01/15 1239)  ondansetron (ZOFRAN) injection 4 mg (4 mg Intravenous Given 08/01/15 0852)  morphine 4 MG/ML injection 4 mg (4 mg Intravenous Given 08/01/15 0852)  gi  cocktail (Maalox,Lidocaine,Donnatal) (30 mLs Oral Given 08/01/15 0852)  sodium chloride 0.9 % bolus 1,000 mL (0 mLs Intravenous Stopped 08/01/15 1239)  morphine 4 MG/ML injection 4 mg (4 mg Intravenous Given 08/01/15 1010)  morphine 4 MG/ML injection 4 mg (4 mg Intramuscular Given 08/01/15 1239)    Discharge Medication List as of 08/01/2015 12:11 PM    START taking these medications   Details  ondansetron (ZOFRAN ODT) 4 MG disintegrating tablet Take 1 tablet (4 mg total) by mouth every 8 (eight) hours as needed for nausea or vomiting., Starting 08/01/2015, Until Discontinued, Print    oxyCODONE (ROXICODONE) 5 MG immediate release tablet Take 1 tablet (5 mg total) by mouth every 4 (four) hours as needed for severe pain., Starting 08/01/2015, Until Discontinued, Print        The patient appears reasonably screen and/or stabilized for discharge and I doubt any other medical condition or other  [facility] requiring further screening, evaluation, or treatment in the ED at this time prior to discharge.    Deno Etienne, DO 08/01/15 1535

## 2015-08-01 NOTE — ED Notes (Signed)
Pt wheeled back to room from waiting room. 

## 2015-08-01 NOTE — Discharge Instructions (Signed)

## 2015-08-01 NOTE — ED Notes (Signed)
Patient complains of headache, emesis, and abdominal pain since yesterday.  Patient alert and oriented at this time.

## 2017-02-18 ENCOUNTER — Other Ambulatory Visit: Payer: Self-pay

## 2017-02-18 ENCOUNTER — Emergency Department
Admission: EM | Admit: 2017-02-18 | Discharge: 2017-02-18 | Disposition: A | Discharge status: Home / Self Care | Payer: BLUE CROSS/BLUE SHIELD | Attending: Emergency Medicine | Admitting: Emergency Medicine

## 2017-02-18 DIAGNOSIS — I10 Essential (primary) hypertension: Secondary | ICD-10-CM | POA: Insufficient documentation

## 2017-02-18 DIAGNOSIS — M25562 Pain in left knee: Secondary | ICD-10-CM | POA: Insufficient documentation

## 2017-02-18 DIAGNOSIS — X58XXXA Exposure to other specified factors, initial encounter: Secondary | ICD-10-CM | POA: Insufficient documentation

## 2017-02-18 DIAGNOSIS — Y999 Unspecified external cause status: Secondary | ICD-10-CM | POA: Insufficient documentation

## 2017-02-18 DIAGNOSIS — Y9367 Activity, basketball: Secondary | ICD-10-CM | POA: Insufficient documentation

## 2017-02-18 DIAGNOSIS — F17210 Nicotine dependence, cigarettes, uncomplicated: Secondary | ICD-10-CM | POA: Insufficient documentation

## 2017-02-18 DIAGNOSIS — Z79899 Other long term (current) drug therapy: Secondary | ICD-10-CM | POA: Insufficient documentation

## 2017-02-18 DIAGNOSIS — Y929 Unspecified place or not applicable: Secondary | ICD-10-CM | POA: Insufficient documentation

## 2017-02-18 MED ORDER — MELOXICAM 15 MG PO TABS: 15.0000 mg | ORAL_TABLET | Freq: Every day | ORAL | 2 refills | Status: DC

## 2017-02-18 NOTE — Discharge Instructions (Signed)
Follow-up with Dr. Posey Pronto if you're not better in one week, wear the Ace wrap and knee immobilizer while you are up walking around for at least 2-3 days, apply ice to the knee , you have been given a prescription for meloxicam 15 mg once a day, take this consistently for the next 7-10 days

## 2017-02-18 NOTE — ED Provider Notes (Signed)
[facility] Emergency Department Provider Note  ____________________________________________   First MD Initiated Contact with Patient 02/18/17 1156     (approximate)  I have reviewed the triage vital signs and the nursing notes.   HISTORY  Chief Complaint Knee Injury    HPI Zachary Harper is a 41 y.o. male planes of left knee pain, states he was playing basketball and hyperextended his knee, there is some swelling to the knee, denies any popping or clicking, does not feel like his knee is unstable, is able to bear weight on it, denies numbness or tingling   Past Medical History:  Diagnosis Date  . Hypertension   . Pancreatitis     Patient Active Problem List   Diagnosis Date Noted  . Hypokalemia 07/09/2011  . 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 Vomiting 07/06/2011  . Dehydration 07/06/2011  . Acute alcohol intoxication ([place]) 07/05/2011  . Leukocytosis 07/05/2011  . HTN (hypertension) 07/05/2011  . Acute pancreatitis 07/05/2011    History reviewed. No pertinent surgical history.  Prior to Admission medications   Medication Sig Start Date End Date Taking? Authorizing Provider  HYDROcodone-acetaminophen (NORCO/VICODIN) 5-325 MG per tablet Take 1 tablet by mouth every 4 (four) hours as needed for moderate pain. Patient not taking: Reported on 08/01/2015 09/22/14   Johnn Hai, PA-C  ibuprofen (ADVIL,MOTRIN) 200 MG tablet Take 200 mg by mouth every 6 (six) hours as needed for moderate pain.    [provider]  meloxicam (MOBIC) 15 MG tablet Take 1 tablet (15 mg total) by mouth daily. 02/18/17 02/18/18  Versie Starks, PA  ondansetron (ZOFRAN ODT) 4 MG disintegrating tablet Take 1 tablet (4 mg total) by mouth every 8 (eight) hours as needed for nausea or vomiting. 08/01/15   Deno Etienne, DO  oxyCODONE (ROXICODONE) 5 MG immediate release tablet Take 1 tablet (5 mg total) by mouth every 4 (four) hours as needed for severe pain.  08/01/15   Deno Etienne, DO  sulfamethoxazole-trimethoprim (BACTRIM DS,SEPTRA DS) 800-160 MG per tablet Take 1 tablet by mouth 2 (two) times daily. Patient not taking: Reported on 08/01/2015 09/22/14   Johnn Hai, PA-C    Allergies Patient has no known allergies.  No family history on file.  Social History Social History   Tobacco Use  . Smoking status: Current Every Day Smoker    Packs/day: 1.00    Types: Cigarettes  . Smokeless tobacco: Never Used  Substance Use Topics  . Alcohol use: Yes    Alcohol/week: 0.0 oz    Types: 12 - 24 Shots of liquor per week    Comment: daily; (08/22/11-reports drinking a fifth in last week, had not drank since getting out of hospital in april  . Drug use: Yes    Types: Marijuana    Review of Systems  Constitutional: No fever/chills Eyes: No visual changes. ENT: No sore throat. Respiratory: Denies cough Genitourinary: Negative for dysuria. Musculoskeletal: Negative for back pain. Positive for left knee pain Skin: Negative for rash.    ____________________________________________   PHYSICAL EXAM:  VITAL SIGNS: ED Triage Vitals [02/18/17 1124]  Enc Vitals Group     BP (!) 117/93     Pulse Rate 83     Resp 20     Temp 98.4 F (36.9 C)     Temp Source Oral     SpO2 98 %     Weight 125 lb (56.7 kg)     Height 5\' 10"  (1.778 m)  Head Circumference      Peak Flow      Pain Score 8     Pain Loc      Pain Edu?      Excl. in [place]?     Constitutional: Alert and oriented. Well appearing and in no acute distress. Eyes: Conjunctivae are normal.  Head: Atraumatic. Nose: No congestion/rhinnorhea. Mouth/Throat: Mucous membranes are moist.   Cardiovascular: Normal rate, regular rhythm. Heart sounds are normal Respiratory: Normal respiratory effort.  No retractions. Lungs are clear to auscultation GU: deferred Musculoskeletal: FROM all extremities, warm and well perfused, no bony tenderness of the left knee, slight swelling at the  medial aspect of the left knee, ligaments appear intact, patient is able to bear weight and ambulate without difficulty Neurologic:  Normal speech and language.  Skin:  Skin is warm, dry and intact. No rash noted. Psychiatric: Mood and affect are normal. Speech and behavior are normal.  ____________________________________________   LABS (all labs ordered are listed, but only abnormal results are displayed)  Labs Reviewed - No data to display ____________________________________________   ____________________________________________  RADIOLOGY    ____________________________________________   PROCEDURES  Procedure(s) performed: Ace wrap and knee immobilizer were applied by the tech      ____________________________________________   INITIAL IMPRESSION / ASSESSMENT AND PLAN / ED COURSE  Pertinent labs & imaging results that were available during my care of the patient were reviewed by me and considered in my medical decision making (see chart for details).  Patient is a 41 year old male with a sprained left knee, he is to try conservative measures at this time, we applied a Ace wrap and knee immobilizer, he was given a prescription for meloxicam 15 mg daily, he is to elevate and ice, if he is not better in 7-10 days she is to follow-up with orthopedics, he can return here if he is worsening      ____________________________________________   FINAL CLINICAL IMPRESSION(S) / ED DIAGNOSES  Final diagnoses:  Acute pain of left knee      NEW MEDICATIONS STARTED DURING THIS VISIT:  This SmartLink is deprecated. Use AVSMEDLIST instead to display the medication list for a patient.   Note:  This document was prepared using Dragon voice recognition software and may include unintentional dictation errors.    Versie Starks, PA 02/18/17 1536    Nance Pear, MD 02/18/17 [phone]

## 2017-02-18 NOTE — ED Triage Notes (Signed)
Left knee pain with movement since yesterday when patient believes that he "hyperextended" his knee. Swelling to left knee present. Pt alert and oriented X4, active, cooperative, pt in NAD. RR even and unlabored, color WNL.

## 2017-08-14 ENCOUNTER — Emergency Department
Admission: EM | Admit: 2017-08-14 | Discharge: 2017-08-14 | Disposition: A | Discharge status: Home / Self Care | Payer: Self-pay | Attending: Emergency Medicine | Admitting: Emergency Medicine

## 2017-08-14 ENCOUNTER — Encounter: Payer: Self-pay | Admitting: Emergency Medicine

## 2017-08-14 DIAGNOSIS — I10 Essential (primary) hypertension: Secondary | ICD-10-CM | POA: Insufficient documentation

## 2017-08-14 DIAGNOSIS — F17210 Nicotine dependence, cigarettes, uncomplicated: Secondary | ICD-10-CM | POA: Insufficient documentation

## 2017-08-14 DIAGNOSIS — K2900 Acute gastritis without bleeding: Secondary | ICD-10-CM | POA: Insufficient documentation

## 2017-08-14 LAB — COMPREHENSIVE METABOLIC PANEL
ALBUMIN: 4 g/dL (ref 3.5–5.0)
ALK PHOS: 68 U/L (ref 38–126)
ALT: 11 U/L — ABNORMAL LOW (ref 17–63)
ANION GAP: 11 (ref 5–15)
AST: 18 U/L (ref 15–41)
BILIRUBIN TOTAL: 1 mg/dL (ref 0.3–1.2)
BUN: 14 mg/dL (ref 6–20)
CALCIUM: 9.2 mg/dL (ref 8.9–10.3)
CO2: 18 mmol/L — ABNORMAL LOW (ref 22–32)
Chloride: 104 mmol/L (ref 101–111)
Creatinine, Ser: 0.99 mg/dL (ref 0.61–1.24)
GLUCOSE: 116 mg/dL — AB (ref 65–99)
Potassium: 4.7 mmol/L (ref 3.5–5.1)
Sodium: 133 mmol/L — ABNORMAL LOW (ref 135–145)
TOTAL PROTEIN: 7.3 g/dL (ref 6.5–8.1)

## 2017-08-14 LAB — CBC
HEMATOCRIT: 49.6 % (ref 40.0–52.0)
Hemoglobin: 16.7 g/dL (ref 13.0–18.0)
MCH: 33.2 pg (ref 26.0–34.0)
MCHC: 33.6 g/dL (ref 32.0–36.0)
MCV: 98.7 fL (ref 80.0–100.0)
Platelets: 436 10*3/uL (ref 150–440)
RBC: 5.02 MIL/uL (ref 4.40–5.90)
RDW: 13.3 % (ref 11.5–14.5)
WBC: 6.3 10*3/uL (ref 3.8–10.6)

## 2017-08-14 LAB — LIPASE, BLOOD: Lipase: 23 U/L (ref 11–51)

## 2017-08-14 MED ORDER — SODIUM CHLORIDE 0.9 % IV SOLN
1000.0000 mL | Freq: Once | INTRAVENOUS | Status: AC
  Administered 2017-08-14: 1000 mL via INTRAVENOUS

## 2017-08-14 MED ORDER — ONDANSETRON 4 MG PO TBDP: 4.0000 mg | ORAL_TABLET | Freq: Three times a day (TID) | ORAL | 0 refills | Status: DC | PRN

## 2017-08-14 MED ORDER — TRAMADOL HCL 50 MG PO TABS: 50.0000 mg | ORAL_TABLET | Freq: Four times a day (QID) | ORAL | 0 refills | Status: DC | PRN

## 2017-08-14 MED ORDER — ONDANSETRON HCL 4 MG/2ML IJ SOLN
4.0000 mg | Freq: Once | INTRAMUSCULAR | Status: AC
  Administered 2017-08-14: 4 mg via INTRAVENOUS
  Filled 2017-08-14: qty 2

## 2017-08-14 MED ORDER — MORPHINE SULFATE (PF) 4 MG/ML IV SOLN
4.0000 mg | Freq: Once | INTRAVENOUS | Status: AC
  Administered 2017-08-14: 4 mg via INTRAVENOUS
  Filled 2017-08-14: qty 1

## 2017-08-14 NOTE — ED Provider Notes (Signed)
[facility] Emergency Department Provider Note   ____________________________________________    I have reviewed the triage vital signs and the nursing notes.   HISTORY  Chief Complaint Abdominal Pain and Emesis     HPI Zachary Harper is a 42 y.o. male who presents with nausea vomiting and abdominal pain since yesterday evening.  Patient reports that he ate at a restaurant around 4 PM and shortly thereafter started feeling cramping in his upper and left abdomen with nausea and vomiting.  No diarrhea.  No fevers reported.  No sick contacts reported.  Does have a history of pancreatitis in the past although this does not feel similar.  Occasional alcohol use now more frequently in the past.  No history of abdominal surgeries  Past Medical History:  Diagnosis Date  . Hypertension   . Pancreatitis     Patient Active Problem List   Diagnosis Date Noted  . Hypokalemia 07/09/2011  . 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 Vomiting 07/06/2011  . Dehydration 07/06/2011  . Acute alcohol intoxication ([place]) 07/05/2011  . Leukocytosis 07/05/2011  . HTN (hypertension) 07/05/2011  . Acute pancreatitis 07/05/2011    History reviewed. No pertinent surgical history.  Prior to Admission medications   Medication Sig Start Date End Date Taking? Authorizing Provider  ondansetron (ZOFRAN ODT) 4 MG disintegrating tablet Take 1 tablet (4 mg total) by mouth every 8 (eight) hours as needed for nausea or vomiting. 08/14/17   Lavonia Drafts, MD  traMADol (ULTRAM) 50 MG tablet Take 1 tablet (50 mg total) by mouth every 6 (six) hours as needed. 08/14/17   Lavonia Drafts, MD     Allergies Patient has no known allergies.  No family history on file.  Social History Social History   Tobacco Use  . Smoking status: Current Every Day Smoker    Packs/day: 1.00    Types: Cigarettes  . Smokeless tobacco: Never Used  Substance Use Topics  . Alcohol use: Yes   Alcohol/week: 0.0 oz    Types: 12 - 24 Shots of liquor per week    Comment: daily; (08/22/11-reports drinking a fifth in last week, had not drank since getting out of hospital in april  . Drug use: Yes    Types: Marijuana    Review of Systems  Constitutional: No fever/chills Eyes: No visual changes.  ENT: No neck pain Cardiovascular: Denies chest pain. Respiratory: Denies shortness of breath. Gastrointestinal: As above Genitourinary: Negative for dysuria. Musculoskeletal: Negative for back pain. Skin: Negative for rash. Neurological: Negative for headaches   ____________________________________________   PHYSICAL EXAM:  VITAL SIGNS: ED Triage Vitals  Enc Vitals Group     BP 08/14/17 0739 (!) 156/96     Pulse Rate 08/14/17 0739 (!) 55     Resp 08/14/17 0739 18     Temp 08/14/17 0739 98.2 F (36.8 C)     Temp Source 08/14/17 0739 Oral     SpO2 08/14/17 0739 100 %     Weight 08/14/17 0734 56.7 kg (125 lb)     Height 08/14/17 0734 1.778 m (5\' 10" )     Head Circumference --      Peak Flow --      Pain Score 08/14/17 0734 10     Pain Loc --      Pain Edu? --      Excl. in [place]? --     Constitutional: Alert and oriented.  Lying prone on the bed Eyes: Conjunctivae are normal.  Nose: No congestion/rhinnorhea. Mouth/Throat: Mucous membranes are dry Neck:  Painless ROM Cardiovascular: Normal rate, regular rhythm. Grossly normal heart sounds.  Good peripheral circulation. Respiratory: Normal respiratory effort.  No retractions.  Gastrointestinal: Very mild tenderness left upper abdomen, no distention.  No CVA tenderness.  Musculoskeletal: .  Warm and well perfused Neurologic:  Normal speech and language. No gross focal neurologic deficits are appreciated.  Skin:  Skin is warm, dry and intact. No rash noted. Psychiatric: Mood and affect are normal. Speech and behavior are normal.  ____________________________________________   LABS (all labs ordered are listed, but  only abnormal results are displayed)  Labs Reviewed  COMPREHENSIVE METABOLIC PANEL - Abnormal; Notable for the following components:      Result Value   Sodium 133 (*)    CO2 18 (*)    Glucose, Bld 116 (*)    ALT 11 (*)    All other components within normal limits  CBC  LIPASE, BLOOD   ____________________________________________  EKG  None ____________________________________________  RADIOLOGY  None ____________________________________________   PROCEDURES  Procedure(s) performed: No  Procedures   Critical Care performed: No ____________________________________________   INITIAL IMPRESSION / ASSESSMENT AND PLAN / ED COURSE  Pertinent labs & imaging results that were available during my care of the patient were reviewed by me and considered in my medical decision making (see chart for details).  Patient presents with nausea and vomiting as above, differential includes gastritis, pancreatitis, food related illness.  Will give IV morphine, IV Zofran, check labs give IV fluids and reevaluate  After re-eval patient reports feeling significantly better.  Lab work is overall unremarkable.  He feels ready to go home, will discharge with Zofran, strict return precautions    ____________________________________________   FINAL CLINICAL IMPRESSION(S) / ED DIAGNOSES  Final diagnoses:  Acute gastritis without hemorrhage, unspecified gastritis type        Note:  This document was prepared using Dragon voice recognition software and may include unintentional dictation errors.    Lavonia Drafts, MD 08/14/17 1100

## 2017-08-14 NOTE — ED Triage Notes (Signed)
Pt reports ate at [employer] yesterday and started with severe abdominal pain and vomiting last night.

## 2017-12-07 ENCOUNTER — Ambulatory Visit: Payer: Self-pay | Admitting: Family Medicine

## 2017-12-17 ENCOUNTER — Ambulatory Visit: Payer: Self-pay | Admitting: Family Medicine

## 2017-12-22 DIAGNOSIS — Z23 Encounter for immunization: Secondary | ICD-10-CM | POA: Diagnosis not present

## 2018-03-18 ENCOUNTER — Emergency Department
Admission: EM | Admit: 2018-03-18 | Discharge: 2018-03-18 | Disposition: A | Discharge status: Home / Self Care | Payer: BLUE CROSS/BLUE SHIELD | Attending: Student in an Organized Health Care Education/Training Program | Admitting: Student in an Organized Health Care Education/Training Program

## 2018-03-18 ENCOUNTER — Other Ambulatory Visit: Payer: Self-pay

## 2018-03-18 DIAGNOSIS — H9202 Otalgia, left ear: Secondary | ICD-10-CM | POA: Diagnosis not present

## 2018-03-18 DIAGNOSIS — J069 Acute upper respiratory infection, unspecified: Secondary | ICD-10-CM | POA: Diagnosis not present

## 2018-03-18 DIAGNOSIS — B9789 Other viral agents as the cause of diseases classified elsewhere: Secondary | ICD-10-CM | POA: Diagnosis not present

## 2018-03-18 DIAGNOSIS — F17210 Nicotine dependence, cigarettes, uncomplicated: Secondary | ICD-10-CM | POA: Diagnosis not present

## 2018-03-18 MED ORDER — PSEUDOEPHEDRINE-GUAIFENESIN 60-375 MG PO TABS: 1.0000 | ORAL_TABLET | Freq: Two times a day (BID) | ORAL | 0 refills | Status: DC

## 2018-03-18 NOTE — ED Notes (Addendum)
See triage note  Presents with left ear pain for the past 3 days  Subjective fever

## 2018-03-18 NOTE — ED Provider Notes (Signed)
[facility] Emergency Department Provider Note  ____________________________________________   None    (approximate)  I have reviewed the triage vital signs and the nursing notes.   HISTORY  Chief Complaint Otalgia   HPI Zachary Harper is a 43 y.o. male presents to the ED with complaint of left ear pain.  Patient states that he has not taken any over-the-counter medication for his ear pain.  He is not aware of any fever but reports congestion.  He denies any drainage from his left ear.  Rates his pain as a 4 out of 10.   Past Medical History:  Diagnosis Date  . Hypertension   . Pancreatitis     Patient Active Problem List   Diagnosis Date Noted  . Hypokalemia 07/09/2011  . 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 Vomiting 07/06/2011  . Dehydration 07/06/2011  . Acute alcohol intoxication ([place]) 07/05/2011  . Leukocytosis 07/05/2011  . HTN (hypertension) 07/05/2011  . Acute pancreatitis 07/05/2011    History reviewed. No pertinent surgical history.  Prior to Admission medications   Medication Sig Start Date End Date Taking? Authorizing Provider  Pseudoephedrine-guaiFENesin 60-375 MG TABS Take 1 tablet by mouth 2 (two) times daily. 03/18/18   Johnn Hai, PA-C    Allergies Patient has no known allergies.  No family history on file.  Social History Social History   Tobacco Use  . Smoking status: Current Every Day Smoker    Packs/day: 1.00    Types: Cigarettes  . Smokeless tobacco: Never Used  Substance Use Topics  . Alcohol use: Yes    Alcohol/week: 12.0 - 24.0 standard drinks    Types: 12 - 24 Shots of liquor per week    Comment: daily; (08/22/11-reports drinking a fifth in last week, had not drank since getting out of hospital in april  . Drug use: Yes    Types: Marijuana    Review of Systems Constitutional: No fever/chills Eyes: No visual changes. ENT: No sore throat.  Positive for left ear  pain. Cardiovascular: Denies chest pain. Respiratory: Denies shortness of breath. Gastrointestinal: No abdominal pain.  No nausea, no vomiting.  No diarrhea.  Genitourinary: Negative for dysuria. Musculoskeletal: Negative for back pain. Skin: Negative for rash. Neurological: Negative for headaches, focal weakness or numbness. ___________________________________________   PHYSICAL EXAM:  VITAL SIGNS: ED Triage Vitals  Enc Vitals Group     BP 03/18/18 0828 122/84     Pulse Rate 03/18/18 0828 76     Resp 03/18/18 0828 16     Temp 03/18/18 0828 98.4 F (36.9 C)     Temp Source 03/18/18 0828 Oral     SpO2 03/18/18 0828 100 %     Weight 03/18/18 0829 125 lb (56.7 kg)     Height 03/18/18 0829 5\' 10"  (1.778 m)     Head Circumference --      Peak Flow --      Pain Score 03/18/18 0829 4     Pain Loc --      Pain Edu? --      Excl. in [place]? --    Constitutional: Alert and oriented. Well appearing and in no acute distress. Eyes: Conjunctivae are normal.  Head: Atraumatic. Nose: Minimal congestion, no rhinorrhea.  EACs are clear bilaterally.  TMs are dull but without erythema or injection. Mouth/Throat: Mucous membranes are moist.  Oropharynx non-erythematous.  Mild posterior drainage noted. Neck: No stridor.   Cardiovascular: Normal rate, regular rhythm. Grossly normal heart sounds.  Good peripheral circulation. Respiratory: Normal respiratory effort.  No retractions. Lungs CTAB. Musculoskeletal: No lower extremity tenderness nor edema.  No joint effusions. Neurologic:  Normal speech and language. No gross focal neurologic deficits are appreciated. No gait instability. Skin:  Skin is warm, dry and intact. No rash noted. Psychiatric: Mood and affect are normal. Speech and behavior are normal.  ____________________________________________   LABS (all labs ordered are listed, but only abnormal results are displayed)  Labs Reviewed - No data to display  PROCEDURES  Procedure(s)  performed: None  Procedures  Critical Care performed: No  ____________________________________________   INITIAL IMPRESSION / ASSESSMENT AND PLAN / ED COURSE  As part of my medical decision making, I reviewed the following data within the electronic MEDICAL RECORD NUMBER Notes from prior ED visits and  Controlled Substance Database  Patient presents to the ED with complaint of left ear pain.  Patient denies any fever or chills.  He has had some mild nasal congestion along with occasional cough.  He has not taken any over-the-counter medication for his symptoms.  Exam is unremarkable except for nasal congestion and posterior drainage.  No otitis externa or media noted.  Patient was discharged with prescription for Entex LA twice daily for congestion.  He is encouraged to drink fluids and also take Tylenol as needed for his ear pain.  ____________________________________________   FINAL CLINICAL IMPRESSION(S) / ED DIAGNOSES  Final diagnoses:  Viral upper respiratory tract infection  Left ear pain     ED Discharge Orders         Ordered    Pseudoephedrine-guaiFENesin 60-375 MG TABS  2 times daily     03/18/18 0933           Note:  This document was prepared using Dragon voice recognition software and may include unintentional dictation errors.    Johnn Hai, PA-C 03/18/18 1127    Merlyn Lot, MD 03/18/18 1226

## 2018-03-18 NOTE — ED Triage Notes (Signed)
Pt c/o left ear pain for the past several days with a hx of swimmers ear in the past

## 2018-03-18 NOTE — Discharge Instructions (Addendum)
Follow-up with your primary care provider or can no clinic acute care if any continued problems.  Begin taking medication twice a day as prescribed.  You may also take Tylenol with this medication if needed for ear pain.  Increase fluids.

## 2018-06-01 ENCOUNTER — Encounter: Payer: Self-pay | Admitting: Family Medicine

## 2018-06-01 ENCOUNTER — Other Ambulatory Visit: Payer: Self-pay

## 2018-06-01 ENCOUNTER — Telehealth: Payer: Self-pay

## 2018-06-01 ENCOUNTER — Ambulatory Visit: Payer: BLUE CROSS/BLUE SHIELD | Admitting: Family Medicine

## 2018-06-01 VITALS — BP 140/100 | HR 115 | Temp 98.4°F | Resp 16 | Ht 68.5 in | Wt 122.3 lb

## 2018-06-01 DIAGNOSIS — I10 Essential (primary) hypertension: Secondary | ICD-10-CM | POA: Diagnosis not present

## 2018-06-01 DIAGNOSIS — Z598 Other problems related to housing and economic circumstances: Secondary | ICD-10-CM

## 2018-06-01 DIAGNOSIS — Z113 Encounter for screening for infections with a predominantly sexual mode of transmission: Secondary | ICD-10-CM

## 2018-06-01 DIAGNOSIS — Z23 Encounter for immunization: Secondary | ICD-10-CM

## 2018-06-01 DIAGNOSIS — F1020 Alcohol dependence, uncomplicated: Secondary | ICD-10-CM | POA: Insufficient documentation

## 2018-06-01 DIAGNOSIS — Z13220 Encounter for screening for lipoid disorders: Secondary | ICD-10-CM | POA: Diagnosis not present

## 2018-06-01 DIAGNOSIS — K4090 Unilateral inguinal hernia, without obstruction or gangrene, not specified as recurrent: Secondary | ICD-10-CM

## 2018-06-01 DIAGNOSIS — F1021 Alcohol dependence, in remission: Secondary | ICD-10-CM

## 2018-06-01 DIAGNOSIS — Z131 Encounter for screening for diabetes mellitus: Secondary | ICD-10-CM

## 2018-06-01 DIAGNOSIS — Z114 Encounter for screening for human immunodeficiency virus [HIV]: Secondary | ICD-10-CM

## 2018-06-01 DIAGNOSIS — Z599 Problem related to housing and economic circumstances, unspecified: Secondary | ICD-10-CM

## 2018-06-01 DIAGNOSIS — H9203 Otalgia, bilateral: Secondary | ICD-10-CM

## 2018-06-01 DIAGNOSIS — F341 Dysthymic disorder: Secondary | ICD-10-CM

## 2018-06-01 DIAGNOSIS — Z8719 Personal history of other diseases of the digestive system: Secondary | ICD-10-CM

## 2018-06-01 DIAGNOSIS — M259 Joint disorder, unspecified: Secondary | ICD-10-CM

## 2018-06-01 HISTORY — DX: Personal history of other diseases of the digestive system: Z87.19

## 2018-06-01 MED ORDER — METOPROLOL SUCCINATE ER 25 MG PO TB24: 25.0000 mg | ORAL_TABLET | Freq: Every day | ORAL | 1 refills | Status: DC

## 2018-06-01 NOTE — Progress Notes (Signed)
Name: Zachary Harper   MRN: [id number]    DOB: [birth year]   Date:06/01/2018       Progress Note  Subjective  Chief Complaint  Chief Complaint  Patient presents with  . 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   . Knee Pain    Onset-Left Knee had to use a brace in the past-had a skating board accident-44 years ago  . Hypertension    Headaches occasionally  . Hair/Scalp Problem  . Groin Pain    Swells-would like checked out right side and discharge white     HPI  Inguinal hernia: he has noticed a bulging and sometimes pain on right side of groin for months, he has some discomfort but otherwise not problems. Able to push it in  Knee pain: he fell on his left knee a couple of years ago, he states used to have some pain, but not recently,denies instability, no effusion or redness, able to play basketball without problems. Gave him reassurance today  HTN : he denies previous history of hypertension , heart rate also up, denies drinking alcohol. Never took medications, EKG today was normal except for peak T waves. No chest pain or palpitation   Otalgia: bilaterally on and off and associated with intermittent tinnitus, no cold symptoms, he states had ear tubes and is worried it is stuck in his head. No ear drainage or fever.   Lesion scalp: he had a bump on his scalp and now keeps scabbing over and he keeps picking on it, no oozing, or bleeding.   Dysthymia: discussed Phq9 , he states lives with parents that require care, going to school, normal stress, financial problems, used to drink heavily to deal with stress. Denies suicidal thoughts or ideation    Patient Active Problem List   Diagnosis Date Noted  . History of alcoholism ([place]) 06/01/2018  . History of pancreatitis 06/01/2018  . HTN (hypertension) 07/05/2011    Past Surgical History:  Procedure Laterality Date  . TYMPANOSTOMY TUBE PLACEMENT Bilateral     Family History  Problem  Relation Age of Onset  . Hypertension Father   . Diabetes Father   . Hypertension Paternal Grandmother   . Other Sister        Leg Issues  . Depression Sister   . Asthma Son   . Asthma Daughter     Social History   Socioeconomic History  . Marital status: Single    Spouse name: Not on file  . Number of children: 2  . Years of education: Not on file  . Highest education level: Bachelor's degree (e.g., BA, AB, BS)  Occupational History  . Occupation: Unemployed  Social Needs  . Financial resource strain: Very hard  . Food insecurity:    Worry: Often true    Inability: Often true  . Transportation needs:    Medical: Yes    Non-medical: Yes  Tobacco Use  . Smoking status: Current Every Day Smoker    Packs/day: 1.00    Years: 6.00    Pack years: 6.00    Types: Cigarettes, Cigars    Start date: 05/31/2012  . Smokeless tobacco: Never Used  Substance and Sexual Activity  . Alcohol use: Yes    Alcohol/week: 12.0 - 24.0 standard drinks    Types: 12 - 24 Shots of liquor per week    Comment: daily; (08/22/11-reports drinking a fifth in last week, had not drank since getting out  of hospital in april  . Drug use: Yes    Types: Marijuana  . Sexual activity: Not Currently    Partners: Female    Comment: Last intercourse was 44 years ago  Lifestyle  . Physical activity:    Days per week: 3 days    Minutes per session: 10 min  . Stress: Not at all  Relationships  . Social connections:    Talks on phone: More than three times a week    Gets together: More than three times a week    Attends religious service: Never    Active member of club or organization: No    Attends meetings of clubs or organizations: Never    Relationship status: Never married  . Intimate partner violence:    Fear of current or ex partner: No    Emotionally abused: No    Physically abused: No    Forced sexual activity: No  Other Topics Concern  . Not on file  Social History Narrative  . Not on file     No current outpatient medications on file.  No Known Allergies  I personally reviewed active problem list, medication list, allergies, family history, social history with the patient/caregiver today.   ROS  Constitutional: Negative for fever or weight change - lost 20 lbs but it was about 3 years ago from changing his diet   Respiratory: Negative for cough and shortness of breath.   Cardiovascular: Negative for chest pain or palpitations.  Gastrointestinal: Negative for abdominal pain, no bowel changes.  Musculoskeletal: Negative for gait problem or joint swelling.  Skin: Negative for rash.  Neurological: Negative for dizziness or headache.  No other specific complaints in a complete review of systems (except as listed in HPI above).  Objective  Vitals:   06/01/18 1118 06/01/18 1249  BP: (!) 130/100 (!) 140/100  Pulse: (!) 115   Resp: 16   Temp: 98.4 F (36.9 C)   TempSrc: Oral   SpO2: 99%   Weight: 122 lb 4.8 oz (55.5 kg)   Height: 5' 8.5" (1.74 m)     Body mass index is 18.33 kg/m.  Physical Exam  Constitutional: Patient appears well-developed and thin  No distress.  HEENT: head atraumatic, normocephalic, pupils equal and reactive to light, ears normal TM, mild wax on ear canal, neck supple, throat within normal limits Cardiovascular: Normal rate, regular rhythm and normal heart sounds.  No murmur heard. No BLE edema. Pulmonary/Chest: Effort normal and breath sounds normal. No respiratory distress. Abdominal: Soft.  There is no tenderness. He has very small right inguinal hernia - direct Skin: area of scarring on left side of nuchal area, no oozing or bleeding, non tender, advised to stop touching area  Psychiatric: Patient has a normal mood and affect. behavior is normal. Judgment and thought content normal. Speech seems pressured, but don't know the baseline    PHQ2/9: Depression screen PHQ 2/9 06/01/2018  Decreased Interest 1  Down, Depressed, Hopeless 2   PHQ - 2 Score 3  Altered sleeping 2  Tired, decreased energy 1  Change in appetite 1  Feeling bad or failure about yourself  1  Trouble concentrating 1  Moving slowly or fidgety/restless 1  Suicidal thoughts 1  PHQ-9 Score 11  Difficult doing work/chores Somewhat difficult   phq9 positive, he will see counselor at our office  Fall Risk: Fall Risk  06/01/2018  Falls in the past year? 0  Number falls in past yr: 0  Injury with  Fall? 0     Functional Status Survey: Is the patient deaf or have difficulty hearing?: No Does the patient have difficulty seeing, even when wearing glasses/contacts?: No Does the patient have difficulty concentrating, remembering, or making decisions?: No Does the patient have difficulty walking or climbing stairs?: No Does the patient have difficulty dressing or bathing?: No Does the patient have difficulty doing errands alone such as visiting a doctor's office or shopping?: No    Assessment & Plan  1. History of alcoholism ([place])  She states only drinking occasionally now, we will continue to monitor for now  2. Encounter for screening for HIV  - HIV Antibody (routine testing w rflx)  3. Need for Tdap vaccination   Up to date  4. Flu vaccine need  refused  5. Financial difficulties  - Ambulatory referral to Chronic Care Management Services  6. Need for vaccination for pneumococcus  - Pneumococcal polysaccharide vaccine 23-valent greater than or equal to 2yo subcutaneous/IM  7. Hypertension, uncontrolled  rx toprol XL sent to pharmacy  - COMPLETE METABOLIC PANEL WITH GFR - CBC with Differential/Platelet - Microalbumin / creatinine urine ratio - TSH  8. Need for hepatitis A and B vaccination  - Hepatitis A vaccine adult IM  9. Inguinal hernia of right side without obstruction or gangrene  reassurance  10. Knee problem  From fall 3 years ago, intermittent problems, able to play sports, reassurance for now  11. Otalgia,  bilateral  Referral ENT, also has tinnitus   12. Routine screening for STI (sexually transmitted infection)  - RPR - Hepatitis panel, acute - Urine cytology ancillary only  13. Screening for diabetes mellitus  - Hemoglobin A1c  14. Lipid screening  - Lipid panel  15. Dysthymia  Referral to counselor

## 2018-06-01 NOTE — Telephone Encounter (Signed)
Called and left a message for the patient to call me back due to Medication and which pharmacy we need to use. He has 3 different locations for [facility]. Also we did not get enough urine and wanted to see if he could come back to give [place] some more. Thanks.

## 2018-06-01 NOTE — Patient Instructions (Addendum)

## 2018-06-02 LAB — HEPATITIS PANEL, ACUTE
Hep A IgM: NONREACTIVE
Hep B C IgM: NONREACTIVE
Hepatitis B Surface Ag: NONREACTIVE
Hepatitis C Ab: NONREACTIVE
SIGNAL TO CUT-OFF: 0.01 (ref <1.00)

## 2018-06-02 LAB — TSH: TSH: 0.85 mIU/L (ref 0.40–4.50)

## 2018-06-02 LAB — LIPID PANEL
CHOLESTEROL: 189 mg/dL (ref <200)
HDL: 123 mg/dL (ref >=40)
LDL Cholesterol (Calc): 55 mg/dL (calc)
Non-HDL Cholesterol (Calc): 66 mg/dL (calc) (ref <130)
Total CHOL/HDL Ratio: 1.5 (calc) (ref <5.0)
Triglycerides: 39 mg/dL (ref <150)

## 2018-06-02 LAB — CBC WITH DIFFERENTIAL/PLATELET
ABSOLUTE MONOCYTES: 792 {cells}/uL (ref 200–950)
Basophils Absolute: 80 cells/uL (ref 0–200)
Basophils Relative: 0.9 %
Eosinophils Absolute: 320 cells/uL (ref 15–500)
Eosinophils Relative: 3.6 %
HCT: 45.3 % (ref 38.5–50.0)
Hemoglobin: 16.1 g/dL (ref 13.2–17.1)
Lymphs Abs: 3373 cells/uL (ref 850–3900)
MCH: 33.5 pg — ABNORMAL HIGH (ref 27.0–33.0)
MCHC: 35.5 g/dL (ref 32.0–36.0)
MCV: 94.4 fL (ref 80.0–100.0)
MPV: 10.3 fL (ref 7.5–12.5)
Monocytes Relative: 8.9 %
Neutro Abs: 4334 cells/uL (ref 1500–7800)
Neutrophils Relative %: 48.7 %
Platelets: 465 10*3/uL — ABNORMAL HIGH (ref 140–400)
RBC: 4.8 10*6/uL (ref 4.20–5.80)
RDW: 12 % (ref 11.0–15.0)
Total Lymphocyte: 37.9 %
WBC: 8.9 10*3/uL (ref 3.8–10.8)

## 2018-06-02 LAB — COMPLETE METABOLIC PANEL WITH GFR
AG Ratio: 1.5 (calc) (ref 1.0–2.5)
ALT: 14 U/L (ref 9–46)
AST: 16 U/L (ref 10–40)
Albumin: 4.2 g/dL (ref 3.6–5.1)
Alkaline phosphatase (APISO): 57 U/L (ref 36–130)
BUN: 10 mg/dL (ref 7–25)
CO2: 26 mmol/L (ref 20–32)
CREATININE: 0.92 mg/dL (ref 0.60–1.35)
Calcium: 9.8 mg/dL (ref 8.6–10.3)
Chloride: 106 mmol/L (ref 98–110)
GFR, Est African American: 118 mL/min/{1.73_m2} (ref >=60)
GFR, Est Non African American: 102 mL/min/{1.73_m2} (ref >=60)
Globulin: 2.8 g/dL (calc) (ref 1.9–3.7)
Glucose, Bld: 95 mg/dL (ref 65–99)
Potassium: 4.5 mmol/L (ref 3.5–5.3)
Sodium: 139 mmol/L (ref 135–146)
Total Bilirubin: 0.6 mg/dL (ref 0.2–1.2)
Total Protein: 7 g/dL (ref 6.1–8.1)

## 2018-06-02 LAB — HEMOGLOBIN A1C
Hgb A1c MFr Bld: 5.4 % of total Hgb (ref <5.7)
Mean Plasma Glucose: 108 (calc)
eAG (mmol/L): 6 (calc)

## 2018-06-02 LAB — MICROALBUMIN / CREATININE URINE RATIO
Creatinine, Urine: 102 mg/dL (ref 20–320)
Microalb Creat Ratio: 5 mcg/mg creat (ref <30)
Microalb, Ur: 0.5 mg/dL

## 2018-06-02 LAB — HIV ANTIBODY (ROUTINE TESTING W REFLEX): HIV 1&2 Ab, 4th Generation: NONREACTIVE

## 2018-06-02 LAB — RPR: RPR: NONREACTIVE

## 2018-06-07 ENCOUNTER — Ambulatory Visit: Payer: Self-pay | Admitting: Pharmacist

## 2018-06-07 DIAGNOSIS — Z599 Problem related to housing and economic circumstances, unspecified: Secondary | ICD-10-CM

## 2018-06-07 DIAGNOSIS — F1021 Alcohol dependence, in remission: Secondary | ICD-10-CM

## 2018-06-07 DIAGNOSIS — Z598 Other problems related to housing and economic circumstances: Secondary | ICD-10-CM

## 2018-06-07 NOTE — Chronic Care Management (AMB) (Signed)
  Care Management   Note  06/07/2018 Name: Haru Shaff MRN: [id number] DOB: [birth year]  Amadu Schlageter is a pleasant 43 y.o. year old male patient of Sowles, Drue Stager, MD.   Dr. Ancil Boozer asked the CCM team to consult today for assistance with chronic disease management related to financial difficulty. Mr.. Lucchesi agreed that care management services would be helpful to them.    Plan: I have scheduled a call to [name] with [place] on Thursday, 3/26 at 11 am.  Patient agreed to services and verbal consent obtained.  Mr. Boivin was given information about Care Management services today including:  1. Case Management services includes personalized support from designated clinical staff supervised by his physician, including individualized plan of care and coordination with other care providers 2. 24/7 contact phone numbers for assistance for urgent and routine care needs. 3. The patient may stop case management services at any time by phone call to the office staff.  Patient agreed to services and verbal consent obtained.     Follow Up: Telephone follow up appointment with CCM team member scheduled for: Thursday, 06/10/18 with [place].   Ruben Reason, PharmD Clinical Pharmacist [facility] [phone]

## 2018-06-07 NOTE — Patient Instructions (Signed)
Zachary Harper was given information about Care Management services today including:  1. Case Management services includes personalized support from designated clinical staff supervised by his physician, including individualized plan of care and coordination with other care providers 2. 24/7 contact phone numbers for assistance for urgent and routine care needs. 3. The patient may stop case management services at any time by phone call to the office staff.  Patient agreed to services and verbal consent obtained.

## 2018-06-10 ENCOUNTER — Telehealth: Payer: Self-pay

## 2018-06-10 ENCOUNTER — Ambulatory Visit: Payer: Self-pay | Admitting: *Deleted

## 2018-06-10 DIAGNOSIS — Z8719 Personal history of other diseases of the digestive system: Secondary | ICD-10-CM

## 2018-06-10 DIAGNOSIS — F1021 Alcohol dependence, in remission: Secondary | ICD-10-CM

## 2018-06-10 NOTE — Chronic Care Management (AMB) (Signed)
   Chronic Care Management   Unsuccessful Call Note 06/10/2018 Name: Zachary Harper MRN: [id number] DOB: September 24, 1975  Zachary Harper is a 43 year old males who sees Dr. Ancil Boozer for primary care. Dr. Ancil Boozer asked the CCM team to consult the patient for financial concerns.   Patient's last office visit was 06/01/18.     This social worker was unable to reach patient via telephone today to for initial assessment.  I have left HIPAA compliant voicemail asking patient to return my call. (unsuccessful outreach #1).   Plan: Will follow-up within 7 business days via telephone.      Elliot Gurney, [place] Administrator, [facility] Care Management [phone]

## 2018-06-15 ENCOUNTER — Telehealth: Payer: Self-pay

## 2018-06-16 ENCOUNTER — Other Ambulatory Visit: Payer: Self-pay

## 2018-06-16 ENCOUNTER — Ambulatory Visit: Payer: Self-pay | Admitting: *Deleted

## 2018-06-16 DIAGNOSIS — I10 Essential (primary) hypertension: Secondary | ICD-10-CM

## 2018-06-16 DIAGNOSIS — F1021 Alcohol dependence, in remission: Secondary | ICD-10-CM

## 2018-06-17 NOTE — Patient Instructions (Signed)
Thank you allowing the Chronic Care Management Team to be a part of your care! It was a pleasure speaking with you today!  1. Please continue to explore and utilize positive coping during stressful events. 2. Please contact the following resources for your dental care:        [facility] [phone]     CCM (Chronic Care Management) Team   Trish Fountain RN, BSN Nurse Care Coordinator  [phone]  Ruben Reason PharmD  Clinical Pharmacist  [phone]   Elliot Gurney, LCSW Clinical Social Worker [phone]  Goals Addressed            This Visit's Progress   . "I want to get my teeth fixed" (pt-stated)       Current Barriers:  . Financial constraints  Clinical Social Work Clinical Goal(s):  Marland Kitchen Over the next 30 days, client will work with SW to address concerns related to his dental health  Interventions: . Patient interviewed and appropriate assessments performed . Provided patient with information about local dental clinics . Discussed plans with patient for ongoing care management follow up and provided patient with direct contact information for care management team  Patient Self Care Activities:  . Self administers medications as prescribed . Attends all scheduled provider appointments . Calls provider office for new concerns or questions  Initial goal documentation     . "I want to reduce my bad habits" (pt-stated)       Current Barriers:  . Financial constraints . Limited social support . Mental Health Concerns  . Substance abuse issues - alcohol use  Clinical Social Work Clinical Goal(s):  Marland Kitchen Over the next 30 days, client will work with SW to address concerns related to utilizing postive coping skills in times of stress.  Interventions: . Patient interviewed and appropriate assessments performed . Allowed patient to vent his frustrations and provided  emotional support . Provided mental health counseling with regard to depression and stress and began to explore positive coping . Discussed plans with patient for ongoing care management follow up and provided patient with direct contact information for care management team . Provided education and assistance to client regarding Advanced Directives.  Patient Self Care Activities:  . Self administers medications as prescribed . Attends all scheduled provider appointments . Calls provider office for new concerns or questions  . Patient able to verbalize motivation for change  Initial goal documentation          The patient verbalized understanding of instructions provided today and declined a print copy of patient instruction materials.   The CM team will reach out to the patient again over the next 14 days.

## 2018-06-17 NOTE — Chronic Care Management (AMB) (Signed)
Initial goal documentation  Care Management    Clinical Social Work INITIAL Behavioral Health Screening Note  06/17/2018 Name: Zachary Harper MRN: [id number] DOB: [birth year]  Zachary Harper is a 43 y.o. year old male who sees Steele Sizer, MD for primary care.  The CCM team to consult the patient for assistance with mental health counseling and community resources.   Referred by: PCP, Dr.Sowles  Review of patient status, including review of consultants reports, relevant laboratory and other test results, and collaboration with appropriate care team members and the patient's provider was performed as part of comprehensive patient evaluation and provision of chronic care management services.   Patient and/or legal guardian verbally consented to meet with LCSW about presenting concerns.  OBJECTIVE:  Depression screen [facility] 2/9 06/16/2018 06/01/2018  Decreased Interest 3 1  Down, Depressed, Hopeless 2 2  PHQ - 2 Score 5 3  Altered sleeping 2 2  Tired, decreased energy 1 1  Change in appetite 0 1  Feeling bad or failure about yourself  1 1  Trouble concentrating 1 1  Moving slowly or fidgety/restless 0 1  Suicidal thoughts 1 1  PHQ-9 Score 11 11  Difficult doing work/chores Somewhat difficult Somewhat difficult     GAD 7 : Generalized Anxiety Score 06/01/2018  Nervous, Anxious, on Edge 1  Control/stop worrying 1  Worry too much - different things 1  Trouble relaxing 1  Restless 3  Easily annoyed or irritable 1  Afraid - awful might happen 1  Total GAD 7 Score 9  Anxiety Difficulty Somewhat difficult     Current and history of substance use:  Patient admitted to limited to developing "bad habits" related to stressful events in his life. Patient stated that he drinks alcohol and smokes but would not provide information related to the frequency or duration.   Family/Social Information:  Housing Arrangement: Patient currently resides with his parents.      Family members/support  persons in your life? Per patient, he is mainly the support system for his parents. He described having very little support other than his peers form school.    Transportation to appointments provided by his parents. Per patient, he has a car but it is not operable. At this time, his parents provide transportation for patient. However, their ability to continue this on a regular basis has reduced.  Financial concerns: Patient has no income at this time, he is a Char[employer] fundraiser.  Food Security: Patient reports having no issues related to food insecurity.  Access to Dental Care: Patient reports that he needs extensive dental work that his insurance does not cover. He states that he had plans to visit the free dental clinic in [facility], however they are closed at this time due to COVID-19.  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 He reports having difficulty with transportation. Per patient, his is the key supporter of his elderly parents.Patient's sister is the POA but they are in the process of changing it to him.  He also discussed financial stressors related to his daily expenses and obtaining dental care. Per patient, his insurance does not cover the extensive dental work needed.  Patient also struggling with a custody battle and is not able to visit with his son.  Caregiving Needs Patient has no caregiving needs.   Goals Addressed  This Visit's Progress   . "I want to get my teeth fixed" (pt-stated)       Current Barriers:  . Financial constraints  Clinical Social Work Clinical Goal(s):  Marland Kitchen Over the next 30 days, client will work with SW to address concerns related to his dental health  Interventions: . Patient interviewed and appropriate assessments performed . Provided patient with information about local dental clinics .  Discussed plans with patient for ongoing care management follow up and provided patient with direct contact information for care management team  Patient Self Care Activities:  . Self administers medications as prescribed . Attends all scheduled provider appointments . Calls provider office for new concerns or questions  Initial goal documentation     . "I want to reduce my bad habits" (pt-stated)       Current Barriers:  . Financial constraints . Limited social support . Mental Health Concerns  . Substance abuse issues - alcohol use  Clinical Social Work Clinical Goal(s):  Marland Kitchen Over the next 30 days, client will work with SW to address concerns related to utilizing postive coping skills in times of stress.  Interventions: . Patient interviewed and appropriate assessments performed . Allowed patient to vent his frustrations and provided emotional support . Provided mental health counseling with regard to depression and stress and began to explore positive coping . Discussed plans with patient for ongoing care management follow up and provided patient with direct contact information for care management team . Provided education and assistance to client regarding Advanced Directives.  Patient Self Care Activities:  . Self administers medications as prescribed . Attends all scheduled provider appointments . Calls provider office for new concerns or questions  . Patient able to verbalize motivation for change  Initial goal documentation          Follow Up Plan: SW will follow up with patient by phone over the next 2 weeks    [place], [place] Worker  [facility] Care Management [phone]

## 2018-06-18 ENCOUNTER — Telehealth: Payer: Self-pay | Admitting: *Deleted

## 2018-06-18 ENCOUNTER — Ambulatory Visit: Payer: Self-pay | Admitting: *Deleted

## 2018-06-18 DIAGNOSIS — F1021 Alcohol dependence, in remission: Secondary | ICD-10-CM

## 2018-06-18 DIAGNOSIS — I10 Essential (primary) hypertension: Secondary | ICD-10-CM

## 2018-06-18 NOTE — Chronic Care Management (AMB) (Signed)
  Chronic Care Management   Social Work Note  06/18/2018 Name: Zachary Harper MRN: [id number] DOB: September 28, 1975  Zachary Harper is a 43 y.o. year old male who sees Steele Sizer, MD for primary care. The CCM team was consulted for assistance with Mental Health Counseling and Resources for dental clinics.  Follow up phone call to patient to provide additional resources for dental clinics. Patient did not answer, voicemail message left requesting a return call.  Goals Addressed   None     Follow Up Plan: SW will follow up with patient by phone over the next 2 weeks.  Elliot Gurney, [place] Administrator, [facility] [phone]

## 2018-07-01 ENCOUNTER — Telehealth: Payer: Self-pay | Admitting: *Deleted

## 2018-07-01 ENCOUNTER — Ambulatory Visit: Payer: Self-pay | Admitting: *Deleted

## 2018-07-01 DIAGNOSIS — Z598 Other problems related to housing and economic circumstances: Secondary | ICD-10-CM

## 2018-07-01 DIAGNOSIS — F1021 Alcohol dependence, in remission: Secondary | ICD-10-CM

## 2018-07-01 DIAGNOSIS — I10 Essential (primary) hypertension: Secondary | ICD-10-CM

## 2018-07-01 DIAGNOSIS — Z599 Problem related to housing and economic circumstances, unspecified: Secondary | ICD-10-CM

## 2018-07-01 NOTE — Chronic Care Management (AMB) (Signed)
   Care Management   Social Work Note  07/01/2018 Name: Zachary Harper MRN: [id number] DOB: [birth year]  Zachary Harper is a 43 y.o. year old male who sees Steele Sizer, MD for primary care. The CCM team was consulted for assistance with Mental Health Counseling and Resources.   Follow up call to patient today to continue assessment for mental health follow up and community resource needs. Patient did not answer. Voicemail message left for a return call.  Goals Addressed   None     Follow Up Plan: SW will follow up with patient by phone over the next week  [place], [place] Worker  [facility] Care Management [phone]

## 2018-07-01 NOTE — Patient Instructions (Signed)
Thank you allowing the Chronic Care Management Team to be a part of your care! It was a pleasure speaking with you today!  1. Please contact the dental clinics provided to you today. [facility] [phone] and [facility] [phone] 2. Please continue to utilize positive coping strategies discussed today to address depression and stress.   CCM (Chronic Care Management) Team   Trish Fountain RN, BSN Nurse Care Coordinator  [phone]  Ruben Reason PharmD  Clinical Pharmacist  [phone]   Elliot Gurney, LCSW Clinical Social Worker [phone]  Goals Addressed            This Visit's Progress   . "I want to get my teeth fixed" (pt-stated)       Current Barriers:  . Financial constraints  Clinical Social Work Clinical Goal(s):  Marland Kitchen Over the next 30 days, client will work with SW to address concerns related to his dental health  Interventions: . Patient interviewed and appropriate assessments performed . Provided patient with information about local dental clinics . Discussed plans with patient for ongoing care management follow up and provided patient with direct contact information for care management team  . Contact information provided for [place] and [facility] [phone]  Patient Self Care Activities:  . Self administers medications as prescribed . Attends all scheduled provider appointments . Calls provider office for new concerns or questions  Please see past updates related to this goal by clicking on the "Past Updates" button in the selected goal      . "I want to reduce my bad habits" (pt-stated)       Current Barriers:  . Financial constraints . Limited social support . Mental Health Concerns  . Substance abuse issues - alcohol use  Clinical Social Work Clinical Goal(s):  Marland Kitchen Over the next 30 days, client will work with SW to address concerns  related to utilizing postive coping skills in times of stress.  Interventions: . Patient interviewed and appropriate assessments performed . Processed patient's feelings regarding current struggles related to school, finances, social relationships and parenting.  Emotional support provided. . Provided mental health counseling with regard to depression and stress. Continued to explore positive coping . Discussed plans with patient for ongoing care management follow up and provided patient with direct contact information for care management team . Provided education and assistance to client regarding Advanced Directives.  Patient Self Care Activities:  . Self administers medications as prescribed . Attends all scheduled provider appointments . Calls provider office for new concerns or questions  . Patient able to verbalize motivation for change  Initial goal documentation          The patient verbalized understanding of instructions provided today and declined a print copy of patient instruction materials.   The CM team will reach out to the patient again over the next 2 weeks.

## 2018-07-01 NOTE — Chronic Care Management (AMB) (Signed)
   Care Management    Clinical Social Work Follow Up Note  07/01/2018 Name: Zachary Harper MRN: [id number] DOB: May 04, 1975  Zachary Harper is a 43 y.o. year old male who is a primary care patient of Steele Sizer, MD. The CCM team was consulted for assistance with Mental Health Counseling and Resources.   Review of patient status, including review of consultants reports, other relevant assessments, and collaboration with appropriate care team members and the patient's provider was performed as part of comprehensive patient evaluation and provision of chronic care management services.     Goals Addressed            This Visit's Progress   . "I want to get my teeth fixed" (pt-stated)       Current Barriers:  . Financial constraints  Clinical Social Work Clinical Goal(s):  Marland Kitchen Over the next 30 days, client will work with SW to address concerns related to his dental health  Interventions: . Patient interviewed and appropriate assessments performed . Provided patient with information about local dental clinics . Discussed plans with patient for ongoing care management follow up and provided patient with direct contact information for care management team  . Contact information provided for [place] and [facility] [phone]  Patient Self Care Activities:  . Self administers medications as prescribed . Attends all scheduled provider appointments . Calls provider office for new concerns or questions  Please see past updates related to this goal by clicking on the "Past Updates" button in the selected goal      . "I want to reduce my bad habits" (pt-stated)       Current Barriers:  . Financial constraints . Limited social support . Mental Health Concerns  . Substance abuse issues - alcohol use  Clinical Social Work Clinical Goal(s):  Marland Kitchen Over the next 30 days, client will work with SW to address concerns related to  utilizing postive coping skills in times of stress.  Interventions: . Patient interviewed and appropriate assessments performed . Processed patient's feelings regarding current struggles related to school, finances, social relationships and parenting.  Emotional support provided. . Provided mental health counseling with regard to depression and stress. Continued to explore positive coping . Discussed plans with patient for ongoing care management follow up and provided patient with direct contact information for care management team . Provided education and assistance to client regarding Advanced Directives.  Patient Self Care Activities:  . Self administers medications as prescribed . Attends all scheduled provider appointments . Calls provider office for new concerns or questions  . Patient able to verbalize motivation for change  Initial goal documentation          Follow Up Plan: SW will follow up with patient by phone over the next 2 weeks   [place], [place] Worker  [facility] Care Management [phone]

## 2018-07-06 ENCOUNTER — Ambulatory Visit: Payer: Self-pay | Admitting: *Deleted

## 2018-07-06 DIAGNOSIS — Z599 Problem related to housing and economic circumstances, unspecified: Secondary | ICD-10-CM

## 2018-07-06 DIAGNOSIS — Z598 Other problems related to housing and economic circumstances: Secondary | ICD-10-CM

## 2018-07-06 DIAGNOSIS — I10 Essential (primary) hypertension: Secondary | ICD-10-CM

## 2018-07-06 NOTE — Patient Instructions (Signed)
Thank you allowing the Chronic Care Management Team to be a part of your care! It was a pleasure speaking with you today!  1. Please follow up with [place] clinic scheduled for 07/19/2018. 2. Please follow up with Dr. Ancil Boozer on 07/16/2018. 3. Please continue to engage with CCM social worker. Please call with any questions or concerns.     CCM (Chronic Care Management) Team   Trish Fountain RN, BSN Nurse Care Coordinator  [phone]  Ruben Reason PharmD  Clinical Pharmacist  [phone]   Elliot Gurney, LCSW Clinical Social Worker [phone]  Goals Addressed            This Visit's Progress   . "I want to get my teeth fixed" (pt-stated)       Current Barriers:  . Financial constraints  Clinical Social Work Clinical Goal(s):  Marland Kitchen Over the next 30 days, client will work with SW to address concerns related to his dental health  Interventions: . Patient interviewed and appropriate assessments performed . Provided patient with information about local dental clinics . Discussed plans with patient for ongoing care management follow up and provided patient with direct contact information for care management team  . Patient discussed making contact with [facility] [phone] and has scheduled an appointment for 07/19/2018. Patient encouraged to provide them with all previous dental records and x-rays obtained within the last year. . Sliding scale process discussed for dental work that will need to be done. Advised patient that community resources to assist with cost to be discussed once dental plan is developed. . Patient informed this social worker that he re-scheduled his follow up appointment with Dr Ancil Boozer for 07/16/18.  Patient Self Care Activities:  . Self administers medications as prescribed . Attends all scheduled provider appointments . Calls provider office for new concerns or questions  Please see past updates related to this goal  by clicking on the "Past Updates" button in the selected goal          The patient verbalized understanding of instructions provided today and declined a print copy of patient instruction materials.   The CM team will reach out to the patient again over the next 14 days.  Next PCP appointment scheduled for: 07/16/2018

## 2018-07-06 NOTE — Chronic Care Management (AMB) (Signed)
Care Management    Clinical Social Work Follow Up Note  07/06/2018 Name: Zachary Harper MRN: [id number] DOB: 1975/07/12  Zachary Harper is a 43 y.o. year old male who is a primary care patient of Steele Sizer, MD. The CCM team was consulted for assistance with Mental Health Counseling and Resources for dental care.   Review of patient status, including review of consultants reports, other relevant assessments, and collaboration with appropriate care team members and the patient's provider was performed as part of comprehensive patient evaluation and provision of chronic care management services.     Goals Addressed            This Visit's Progress   . "I want to get my teeth fixed" (pt-stated)       Current Barriers:  . Financial constraints  Clinical Social Work Clinical Goal(s):  Marland Kitchen Over the next 30 days, client will work with SW to address concerns related to his dental health  Interventions: . Patient interviewed and appropriate assessments performed . Provided patient with information about local dental clinics . Discussed plans with patient for ongoing care management follow up and provided patient with direct contact information for care management team  . Patient discussed making contact with [facility] [phone] and has scheduled an appointment for 07/19/2018. Patient encouraged to provide them with all previous dental records and x-rays obtained within the last year. . Sliding scale process discussed for dental work that will need to be done. Advised patient that community resources to assist with cost to be discussed once dental plan is developed. . Patient informed this social worker that he re-scheduled his follow up appointment with Dr Ancil Boozer for 07/16/18.  Patient Self Care Activities:  . Self administers medications as prescribed . Attends all scheduled provider appointments . Calls provider office for new concerns or questions  Please see past  updates related to this goal by clicking on the "Past Updates" button in the selected goal          Follow Up Plan: SW will follow up with patient by phone over the next 2 weeks   [place], [place] Worker  [facility] Care Management [phone]

## 2018-07-08 ENCOUNTER — Ambulatory Visit: Payer: BLUE CROSS/BLUE SHIELD | Admitting: Family Medicine

## 2018-07-08 ENCOUNTER — Telehealth: Payer: Self-pay

## 2018-07-15 ENCOUNTER — Telehealth: Payer: Self-pay

## 2018-07-16 ENCOUNTER — Ambulatory Visit: Payer: BLUE CROSS/BLUE SHIELD | Admitting: Family Medicine

## 2018-07-22 ENCOUNTER — Ambulatory Visit: Payer: Self-pay | Admitting: *Deleted

## 2018-07-22 DIAGNOSIS — Z599 Problem related to housing and economic circumstances, unspecified: Secondary | ICD-10-CM

## 2018-07-22 DIAGNOSIS — Z598 Other problems related to housing and economic circumstances: Secondary | ICD-10-CM

## 2018-07-22 DIAGNOSIS — I10 Essential (primary) hypertension: Secondary | ICD-10-CM

## 2018-07-22 NOTE — Chronic Care Management (AMB) (Signed)
   Care Management    Clinical Social Work Follow Up Note  07/22/2018 Name: Zachary Harper MRN: [id number] DOB: December 05, 1975  Zachary Harper is a 43 y.o. year old male who is a primary care patient of Steele Sizer, MD. The CCM team was consulted for assistance with Mental Health Counseling and Resources.   Review of patient status, including review of consultants reports, other relevant assessments, and collaboration with appropriate care team members and the patient's provider was performed as part of comprehensive patient evaluation and provision of chronic care management services.     Goals Addressed            This Visit's Progress   . "I want to get my teeth fixed" (pt-stated)       Current Barriers:  . Financial constraints  Clinical Social Work Clinical Goal(s):  Marland Kitchen Over the next 30 days, client will work with SW to address concerns related to his dental health  Interventions: . Discussed plans with patient for ongoing care management follow up and provided patient with direct contact information for care management team  . Confirmed tele med appointment  with [facility] [phone] on 07/19/2018. Further confirmed in person appointment scheduled for 07/29/18 where x-rays will be taken and a treatment plan made. . Payment options explored   Patient Self Care Activities:  . Self administers medications as prescribed . Attends all scheduled provider appointments . Calls provider office for new concerns or questions  Please see past updates related to this goal by clicking on the "Past Updates" button in the selected goal      . "I want to reduce my bad habits" (pt-stated)       Current Barriers:  . Financial constraints . Limited social support . Mental Health Concerns  . Substance abuse issues - alcohol use  Clinical Social Work Clinical Goal(s):  Marland Kitchen Over the next 30 days, client will work with SW to address concerns related to utilizing postive  coping skills in times of stress.  Interventions: . Patient interviewed and appropriate assessments performed . Processed patient's feelings regarding completing school year, continued financial and relationship struggles.  Emotional support and encouragement  provided.  . Provided mental health counseling with regard to depression and stress. Continued to explore positive coping activities, such has walking, healthy diet, appropriate sleep etc. . Discussed plans with patient for ongoing care management follow up and provided patient with direct contact information for care management team   Patient Self Care Activities:  . Self administers medications as prescribed . Attends all scheduled provider appointments . Calls provider office for new concerns or questions  . Patient able to verbalize motivation for change  Please see past updates related to this goal by clicking on the "Past Updates" button in the selected goal           Follow Up Plan: SW will follow up with patient by phone over the next 2 weeks   [place], [place] Worker  [facility] Care Management [phone]

## 2018-07-23 ENCOUNTER — Other Ambulatory Visit: Payer: Self-pay

## 2018-07-23 ENCOUNTER — Encounter: Payer: Self-pay | Admitting: Family Medicine

## 2018-07-23 ENCOUNTER — Ambulatory Visit (INDEPENDENT_AMBULATORY_CARE_PROVIDER_SITE_OTHER): Payer: BLUE CROSS/BLUE SHIELD | Admitting: Family Medicine

## 2018-07-23 DIAGNOSIS — I10 Essential (primary) hypertension: Secondary | ICD-10-CM | POA: Diagnosis not present

## 2018-07-23 DIAGNOSIS — F341 Dysthymic disorder: Secondary | ICD-10-CM | POA: Diagnosis not present

## 2018-07-23 DIAGNOSIS — F1021 Alcohol dependence, in remission: Secondary | ICD-10-CM

## 2018-07-23 NOTE — Progress Notes (Signed)
Name: Zachary Harper   MRN: [id number]    DOB: [birth year]   Date:07/23/2018       Progress Note  Subjective  Chief Complaint  Chief Complaint  Patient presents with  . Follow-up    1 month F/U  . Hypertension    Taking Metoprolol every other day and trying to workout to naturally bring his BP down. Denies any symptoms    I connected with  [name]  on 07/23/18 at  3:40 PM EDT by a video enabled telemedicine application and verified that I am speaking with the correct person using two identifiers.  I discussed the limitations of evaluation and management by telemedicine and the availability of in person appointments. The patient expressed understanding and agreed to proceed. Staff also discussed with the patient that there may be a patient responsible charge related to this service. Patient Location: at home Provider Location: [facility]   HPI   HTN : he was seen in march and bp was very high we placed him on metoprolol, he states he has only been drinking on weekends and has been exercising more so he decided to only take medication every other day. Advised to take it daily and we will bring him next week for a bp check with CMA, we can discuss stopping medication that day if bp is at goal. He denies chest pain, palpitation   Dysthymia: since last visit he has been talking to [place], LCSW and is feeling better, his pqh 9 showed great improvement. He states she has helped him find a Pharmacist, community, he is no longer in school ( classes are over) and she has discussed importance of him staying busy to avoid drinking. He is no longer having suicidal thoughts.    Patient Active Problem List   Diagnosis Date Noted  . History of alcoholism ([place]) 06/01/2018  . History of pancreatitis 06/01/2018  . HTN (hypertension) 07/05/2011    Past Surgical History:  Procedure Laterality Date  . TYMPANOSTOMY TUBE PLACEMENT Bilateral     Family History  Problem Relation  Age of Onset  . Hypertension Father   . Diabetes Father   . Hypertension Paternal Grandmother   . Other Sister        Leg Issues  . Depression Sister   . Asthma Son   . Asthma Daughter     Social History   Socioeconomic History  . Marital status: Single    Spouse name: Not on file  . Number of children: 2  . Years of education: Not on file  . Highest education level: Bachelor's degree (e.g., BA, AB, BS)  Occupational History  . Occupation: Unemployed  Social Needs  . Financial resource strain: Very hard  . Food insecurity:    Worry: Often true    Inability: Often true  . Transportation needs:    Medical: Yes    Non-medical: Yes  Tobacco Use  . Smoking status: Current Every Day Smoker    Packs/day: 1.00    Years: 6.00    Pack years: 6.00    Types: Cigarettes, Cigars    Start date: 05/31/2012  . Smokeless tobacco: Never Used  Substance and Sexual Activity  . Alcohol use: Yes    Alcohol/week: 12.0 - 24.0 standard drinks    Types: 12 - 24 Shots of liquor per week    Comment: daily; (08/22/11-reports drinking a fifth in last week, had not drank since getting out of hospital in april  . Drug use:  Yes    Types: Marijuana  . Sexual activity: Not Currently    Partners: Female    Comment: Last intercourse was 2 years ago  Lifestyle  . Physical activity:    Days per week: 3 days    Minutes per session: 10 min  . Stress: Not at all  Relationships  . Social connections:    Talks on phone: More than three times a week    Gets together: More than three times a week    Attends religious service: Never    Active member of club or organization: No    Attends meetings of clubs or organizations: Never    Relationship status: Never married  . Intimate partner violence:    Fear of current or ex partner: No    Emotionally abused: No    Physically abused: No    Forced sexual activity: No  Other Topics Concern  . Not on file  Social History Narrative  . Not on file      Current Outpatient Medications:  .  metoprolol succinate (TOPROL-XL) 25 MG 24 hr tablet, Take 1 tablet (25 mg total) by mouth daily. (Patient taking differently: Take 25 mg by mouth every other day. ), Disp: 30 tablet, Rfl: 1  No Known Allergies  I personally reviewed active problem list, medication list, allergies, family history with the patient/caregiver today.   ROS  Ten systems reviewed and is negative except as mentioned in HPI   Objective  Virtual encounter, vitals not obtained.  There is no height or weight on file to calculate BMI.  Physical Exam  Awake, alert and oriented and in no distress   PHQ2/9: Depression screen [facility] 2/9 07/23/2018 06/16/2018 06/01/2018  Decreased Interest 0 3 1  Down, Depressed, Hopeless 1 2 2   PHQ - 2 Score 1 5 3   Altered sleeping 1 2 2   Tired, decreased energy 0 1 1  Change in appetite 1 0 1  Feeling bad or failure about yourself  0 1 1  Trouble concentrating 0 1 1  Moving slowly or fidgety/restless 0 0 1  Suicidal thoughts 0 1 1  PHQ-9 Score 3 11 11   Difficult doing work/chores Not difficult at all Somewhat difficult Somewhat difficult   PHQ-2/9 Result is negative.    Fall Risk: Fall Risk  07/23/2018 06/16/2018 06/01/2018  Falls in the past year? 0 0 0  Number falls in past yr: 0 0 0  Injury with Fall? 0 0 0     Assessment & Plan  1. Essential hypertension  Resume metoprolol daily   2. History of alcoholism ([place])  Advised to measure how much he drinks, still drinking  Three 6-8 ounce cup with hard liquor on weekends  3. Dysthymia  Improving   I discussed the assessment and treatment plan with the patient. The patient was provided an opportunity to ask questions and all were answered. The patient agreed with the plan and demonstrated an understanding of the instructions.  The patient was advised to call back or seek an in-person evaluation if the symptoms worsen or if the condition fails to improve as anticipated.  I provided  15 minutes of non-face-to-face time during this encounter.

## 2018-07-27 ENCOUNTER — Ambulatory Visit: Payer: Self-pay | Admitting: *Deleted

## 2018-07-27 DIAGNOSIS — I10 Essential (primary) hypertension: Secondary | ICD-10-CM

## 2018-07-27 DIAGNOSIS — F1021 Alcohol dependence, in remission: Secondary | ICD-10-CM

## 2018-07-27 NOTE — Patient Instructions (Addendum)
Thank you allowing the Chronic Care Management Team to be a part of your care! It was a pleasure speaking with you today!  1. Please continue to practice self care 2. Please contact this Education officer, museum with any questions or concerns to assist with any community resource needs.  CCM (Chronic Care Management) Team   Trish Fountain RN, BSN Nurse Care Coordinator  [phone]  Ruben Reason PharmD  Clinical Pharmacist  [phone]   [place], LCSW Clinical Social Worker [phone]  Goals Addressed   None      The patient verbalized understanding of instructions provided today and declined a print copy of patient instruction materials.   Telephone follow up appointment with CCM team member scheduled for:08/05/18

## 2018-07-27 NOTE — Chronic Care Management (AMB) (Addendum)
  Chronic Care Management   Social Work Note  07/27/2018 Name: Zachary Harper MRN: [id number] DOB: 03/17/1976  Zachary Harper is a 43 y.o. year old male who sees Steele Sizer, MD for primary care. The CCM team was consulted for assistance with Mental Health Counseling and Resources.   Patient contacted this social worker today by phone. Patient discussed relief that Legal Aid has agreed to assist him with establishing regular visitation with his son. He also discussed being caught up with his child support and now can confidently present to child support court. Patient has completed the school year and is feeling relieved. Mood has improved. Patient provided with encouragement and emotional support. Goals Addressed   None     Follow Up Plan: SW will follow up with patient by phone over the next 2 weeks  [place], [place] Worker  [facility] Care Management [phone]

## 2018-08-02 ENCOUNTER — Ambulatory Visit: Payer: BLUE CROSS/BLUE SHIELD

## 2018-08-05 ENCOUNTER — Ambulatory Visit: Payer: Self-pay | Admitting: *Deleted

## 2018-08-05 DIAGNOSIS — Z599 Problem related to housing and economic circumstances, unspecified: Secondary | ICD-10-CM

## 2018-08-05 DIAGNOSIS — I10 Essential (primary) hypertension: Secondary | ICD-10-CM

## 2018-08-05 DIAGNOSIS — Z598 Other problems related to housing and economic circumstances: Secondary | ICD-10-CM

## 2018-08-05 NOTE — Chronic Care Management (AMB) (Signed)
  Chronic Care Management    Clinical Social Work Follow Up Note  08/05/2018 Name: Rohail Klees MRN: [id number] DOB: 05-30-1975  Janet Decesare is a 43 y.o. year old male who is a primary care patient of Steele Sizer, MD. The CCM team was consulted for assistance with Mental Health Counseling and Resources.   Review of patient status, including review of consultants reports, other relevant assessments, and collaboration with appropriate care team members and the patient's provider was performed as part of comprehensive patient evaluation and provision of chronic care management services.     Goals Addressed            This Visit's Progress   . "I want to get my teeth fixed" (pt-stated)       Current Barriers:  . Financial constraints  Clinical Social Work Clinical Goal(s):  Marland Kitchen Over the next 30 days, client will work with SW to address concerns related to his dental health  Interventions: . Discussed plans with patient for ongoing care management follow up and provided patient with direct contact information for care management team  . Discussed follow up with [facility], root canal received . Discussed follow up need for a crown and options for financial assistance . Discussed follow up with [facility] on 09/09/2018   Patient Self Care Activities:  . Self administers medications as prescribed . Attends all scheduled provider appointments . Calls provider office for new concerns or questions  Please see past updates related to this goal by clicking on the "Past Updates" button in the selected goal      . "I want to reduce my bad habits" (pt-stated)       Current Barriers:  . Financial constraints . Limited social support . Mental Health Concerns  . Substance abuse issues - alcohol use  Clinical Social Work Clinical Goal(s):  Marland Kitchen Over the next 30 days, client will work with SW to address concerns related to utilizing postive coping skills in  times of stress.  Interventions: . Patient interviewed and appropriate assessments performed . Processed patient's feelings regarding continued financial difficulties and struggles with custody issues regarding his son. . Confirmed that patient continues to work with Legal Aid for assistance with custody concerns  . Provided mental health counseling with regard to depression and stress. Continued to explore positive coping activities, such has walking, healthy diet, appropriate sleep etc. . Discussed plans with patient for ongoing care management follow up and provided patient with direct contact information for care management team   Patient Self Care Activities:  . Self administers medications as prescribed . Attends all scheduled provider appointments . Calls provider office for new concerns or questions  . Patient able to verbalize motivation for change  Please see past updates related to this goal by clicking on the "Past Updates" button in the selected goal           Follow Up Plan: SW will follow up with patient by phone over the next 2 weeks   [place], [place] Worker  [facility] Care Management [phone]

## 2018-08-05 NOTE — Patient Instructions (Signed)
Thank you allowing the Chronic Care Management Team to be a part of your care! It was a pleasure speaking with you today!  1. Please follow up with [facility] and [facility] for available hardship programs. 2. Please call this social worker with any questions or concerns in regards to your community resource needs.       CCM (Chronic Care Management) Team   Trish Fountain RN, BSN Nurse Care Coordinator  [phone]  Ruben Reason PharmD  Clinical Pharmacist  [phone]   Elliot Gurney, LCSW Clinical Social Worker [phone]  Goals Addressed            This Visit's Progress   . "I want to get my teeth fixed" (pt-stated)       Current Barriers:  . Financial constraints  Clinical Social Work Clinical Goal(s):  Marland Kitchen Over the next 30 days, client will work with SW to address concerns related to his dental health  Interventions: . Discussed plans with patient for ongoing care management follow up and provided patient with direct contact information for care management team  . Discussed follow up with [facility], root canal received . Discussed follow up need for a crown and options for financial assistance . Discussed follow up with [facility] on 09/09/2018   Patient Self Care Activities:  . Self administers medications as prescribed . Attends all scheduled provider appointments . Calls provider office for new concerns or questions  Please see past updates related to this goal by clicking on the "Past Updates" button in the selected goal      . "I want to reduce my bad habits" (pt-stated)       Current Barriers:  . Financial constraints . Limited social support . Mental Health Concerns  . Substance abuse issues - alcohol use  Clinical Social Work Clinical Goal(s):  Marland Kitchen Over the next 30 days, client will work with SW to address concerns related to utilizing postive coping skills in times of stress.  Interventions: . Patient  interviewed and appropriate assessments performed . Processed patient's feelings regarding continued financial difficulties and struggles with custody issues regarding his son. . Confirmed that patient continues to work with Legal Aid for assistance with custody concerns  . Provided mental health counseling with regard to depression and stress. Continued to explore positive coping activities, such has walking, healthy diet, appropriate sleep etc. . Discussed plans with patient for ongoing care management follow up and provided patient with direct contact information for care management team   Patient Self Care Activities:  . Self administers medications as prescribed . Attends all scheduled provider appointments . Calls provider office for new concerns or questions  . Patient able to verbalize motivation for change  Please see past updates related to this goal by clicking on the "Past Updates" button in the selected goal           The patient verbalized understanding of instructions provided today and declined a print copy of patient instruction materials.   The CM team will reach out to the patient again over the next 14 days.

## 2018-08-19 ENCOUNTER — Ambulatory Visit: Payer: Self-pay | Admitting: *Deleted

## 2018-08-20 ENCOUNTER — Ambulatory Visit: Payer: Self-pay | Admitting: *Deleted

## 2018-08-20 ENCOUNTER — Telehealth: Payer: Self-pay

## 2018-08-31 ENCOUNTER — Ambulatory Visit: Payer: Self-pay

## 2018-08-31 ENCOUNTER — Ambulatory Visit: Payer: Self-pay | Admitting: *Deleted

## 2018-09-02 ENCOUNTER — Ambulatory Visit: Payer: Self-pay | Admitting: *Deleted

## 2018-09-02 DIAGNOSIS — Z598 Other problems related to housing and economic circumstances: Secondary | ICD-10-CM

## 2018-09-02 DIAGNOSIS — I10 Essential (primary) hypertension: Secondary | ICD-10-CM

## 2018-09-02 DIAGNOSIS — Z599 Problem related to housing and economic circumstances, unspecified: Secondary | ICD-10-CM

## 2018-09-03 NOTE — Patient Instructions (Signed)
Thank you allowing the Chronic Care Management Team to be a part of your care! It was a pleasure speaking with you today!  1. Please continue to practice positive coping strategies and self care 2. Please call this social worker when any questions or concerns regarding your mental health needs  CCM (Chronic Care Management) Team   Trish Fountain RN, BSN Nurse Care Coordinator  [phone]  Ruben Reason PharmD  Clinical Pharmacist  [phone]   Elliot Gurney, [place] Social Worker [phone]  Goals Addressed            This Visit's Progress   . "I want to reduce my bad habits" (pt-stated)       Current Barriers:  . Financial constraints . Limited social support . Mental Health Concerns  . Substance abuse issues - alcohol use  Clinical Social Work Clinical Goal(s):  Marland Kitchen Over the next 30 days, client will work with SW to address concerns related to utilizing postive coping skills in times of stress.  Interventions: . Patient interviewed and appropriate assessments performed . Processed patient's feelings regarding continued financial difficulties and struggles and efforts to identify employment . 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 .  Emotional support provided continuing to reinforce positive coping strategies to cope with daily frustrations . Discussed plans with patient for ongoing care management follow up and provided patient with direct contact information for care management team   Patient Self Care Activities:  . Self administers medications as prescribed . Attends all scheduled provider appointments . Calls provider office for new concerns or questions  . Patient able to verbalize motivation for change  Please see past updates related to this goal by clicking on the "Past Updates" button in the selected goal           The patient verbalized  understanding of instructions provided today and declined a print copy of patient instruction materials.   The care management team will reach out to the patient again over the next 14 days days.

## 2018-09-03 NOTE — Chronic Care Management (AMB) (Signed)
  Chronic Care Management    Clinical Social Work Follow Up Note  09/03/2018 Name: Zachary Harper MRN: [id number] DOB: April 16, 1975  Zachary Harper is a 43 y.o. year old male who is a primary care patient of Steele Sizer, MD. The CCM team was consulted for assistance with Mental Health Counseling and Resources.   Review of patient status, including review of consultants reports, other relevant assessments, and collaboration with appropriate care team members and the patient's provider was performed as part of comprehensive patient evaluation and provision of chronic care management services.     Goals Addressed            This Visit's Progress   . "I want to reduce my bad habits" (pt-stated)       Current Barriers:  . Financial constraints . Limited social support . Mental Health Concerns  . Substance abuse issues - alcohol use  Clinical Social Work Clinical Goal(s):  Marland Kitchen Over the next 30 days, client will work with SW to address concerns related to utilizing postive coping skills in times of stress.  Interventions: . Patient interviewed and appropriate assessments performed . Processed patient's feelings regarding continued financial difficulties and struggles and efforts to identify employment . 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 .  Emotional support provided continuing to reinforce positive coping strategies to cope with daily frustrations . Discussed plans with patient for ongoing care management follow up and provided patient with direct contact information for care management team   Patient Self Care Activities:  . Self administers medications as prescribed . Attends all scheduled provider appointments . Calls provider office for new concerns or questions  . Patient able to verbalize motivation for change  Please see past updates related to this goal by clicking on the "Past  Updates" button in the selected goal           Follow Up Plan: SW will follow up with patient by phone over the next 2 weeks   [place], [place] Worker  [facility] Care Management [phone]

## 2018-09-08 ENCOUNTER — Ambulatory Visit: Payer: Self-pay | Admitting: *Deleted

## 2018-09-08 ENCOUNTER — Encounter: Payer: Self-pay | Admitting: *Deleted

## 2018-09-08 ENCOUNTER — Telehealth: Payer: Self-pay

## 2018-09-08 DIAGNOSIS — F1021 Alcohol dependence, in remission: Secondary | ICD-10-CM

## 2018-09-08 DIAGNOSIS — Z598 Other problems related to housing and economic circumstances: Secondary | ICD-10-CM

## 2018-09-08 DIAGNOSIS — I10 Essential (primary) hypertension: Secondary | ICD-10-CM

## 2018-09-08 DIAGNOSIS — Z8719 Personal history of other diseases of the digestive system: Secondary | ICD-10-CM

## 2018-09-08 DIAGNOSIS — Z599 Problem related to housing and economic circumstances, unspecified: Secondary | ICD-10-CM

## 2018-09-08 NOTE — Progress Notes (Signed)
This encounter was created in error - please disregard.

## 2018-09-08 NOTE — Chronic Care Management (AMB) (Addendum)
    Care Management   Unsuccessful Call Note 09/08/2018 Name: Zachary Harper MRN: [id number] DOB: 08/02/1975  Patient is a 43year old male who sees Steele Sizer for primary care. Dr. Ancil Boozer asked the CCM team to consult the patient for mental health counseling and community resources related to his financial needs   This social worker was unable to reach patient via telephone today for follow up. I have left HIPAA compliant voicemail asking patient to return my call. (unsuccessful outreach #1).   Plan: Will follow-up within 7 business days via telephone.     Elliot Gurney, [place] Administrator, [facility] [phone]
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
## 2018-09-15 ENCOUNTER — Telehealth: Payer: Self-pay

## 2018-09-16 ENCOUNTER — Telehealth: Payer: Self-pay

## 2018-09-16 ENCOUNTER — Ambulatory Visit: Payer: Self-pay | Admitting: *Deleted

## 2018-09-16 DIAGNOSIS — Z598 Other problems related to housing and economic circumstances: Secondary | ICD-10-CM

## 2018-09-16 DIAGNOSIS — Z599 Problem related to housing and economic circumstances, unspecified: Secondary | ICD-10-CM

## 2018-09-16 DIAGNOSIS — I10 Essential (primary) hypertension: Secondary | ICD-10-CM

## 2018-09-16 NOTE — Chronic Care Management (AMB) (Signed)
  Chronic Care Management    Clinical Social Work Follow Up Note  09/16/2018 Name: Zachary Harper MRN: [id number] DOB: [birth year]  Zachary Harper is a 43 y.o. year old male who is a primary care patient of Steele Sizer, MD. The CCM team was consulted for assistance with Mental Health Counseling and Resources.  Patient was on his way to work and was only able to speak briefly.   Review of patient status, including review of consultants reports, other relevant assessments, and collaboration with appropriate care team members and the patient's provider was performed as part of comprehensive patient evaluation and provision of chronic care management services.     Goals Addressed            This Visit's Progress   . "I want to get my teeth fixed" (pt-stated)       Current Barriers:  . Financial constraints  Clinical Social Work Clinical Goal(s):  Marland Kitchen Over the next 90 days, client will work with SW to address concerns related to his dental health  Interventions: . Discussed plans with patient for ongoing care management follow up and provided patient with direct contact information for care management team  . Congratulated patient on efforts for self sufficiency . Confirmed that patient has obtained a part-time job to assist with daily financial needs and dental care.      Patient Self Care Activities:  . Self administers medications as prescribed . Attends all scheduled provider appointments . Calls provider office for new concerns or questions  Please see past updates related to this goal by clicking on the "Past Updates" button in the selected goal          Follow Up Plan: SW will follow up with patient by phone over the next 2 weeks   [place], [place] Worker  [facility] Care Management [phone]

## 2018-09-16 NOTE — Patient Instructions (Signed)
Thank you allowing the Chronic Care Management Team to be a part of your care! It was a pleasure speaking with you today!  1. Please call this social worker with any questions or concerns regarding your community resource needs     CCM (Chronic Care Management) Team   Trish Fountain RN, BSN Nurse Care Coordinator  [phone]  Ruben Reason PharmD  Clinical Pharmacist  [phone]   Elliot Gurney, LCSW Clinical Social Worker [phone]  Goals Addressed            This Visit's Progress   . "I want to get my teeth fixed" (pt-stated)       Current Barriers:  . Financial constraints  Clinical Social Work Clinical Goal(s):  Marland Kitchen Over the next 90 days, client will work with SW to address concerns related to his dental health  Interventions: . Discussed plans with patient for ongoing care management follow up and provided patient with direct contact information for care management team  . Congratulated patient on efforts for self sufficiency . Confirmed that patient has obtained a part-time job to assist with daily financial needs and dental care.      Patient Self Care Activities:  . Self administers medications as prescribed . Attends all scheduled provider appointments . Calls provider office for new concerns or questions  Please see past updates related to this goal by clicking on the "Past Updates" button in the selected goal          The patient verbalized understanding of instructions provided today and declined a print copy of patient instruction materials.   The care management team will reach out to the patient again over the next 14 days.

## 2018-09-28 ENCOUNTER — Encounter: Payer: BLUE CROSS/BLUE SHIELD | Admitting: Family Medicine

## 2018-09-30 ENCOUNTER — Ambulatory Visit: Payer: Self-pay | Admitting: *Deleted

## 2018-09-30 DIAGNOSIS — Z598 Other problems related to housing and economic circumstances: Secondary | ICD-10-CM

## 2018-09-30 DIAGNOSIS — I10 Essential (primary) hypertension: Secondary | ICD-10-CM

## 2018-09-30 DIAGNOSIS — Z599 Problem related to housing and economic circumstances, unspecified: Secondary | ICD-10-CM

## 2018-09-30 NOTE — Chronic Care Management (AMB) (Signed)
  Chronic Care Management    Clinical Social Work Follow Up Note  09/30/2018 Name: Zachary Harper MRN: [id number] DOB: 1975-10-31  Zachary Harper is a 43 y.o. year old male who is a primary care patient of Steele Sizer, MD. The CCM team was consulted for assistance with Mental Health Counseling and Resources.   Review of patient status, including review of consultants reports, other relevant assessments, and collaboration with appropriate care team members and the patient's provider was performed as part of comprehensive patient evaluation and provision of chronic care management services.     Goals Addressed            This Visit's Progress   . "I want to get my teeth fixed" (pt-stated)       Current Barriers:  . Financial constraints  Clinical Social Work Clinical Goal(s):  Marland Kitchen Over the next 90 days, client will work with SW to address concerns related to his dental health  Interventions: . Discussed plans with patient for ongoing care management follow up and provided patient with direct contact information for care management team  . 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 . Provided patient with positive reinforcement regarding his efforts for self sufficiency . Explored patient's plans to return to school in the fall.      Patient Self Care Activities:  . Self administers medications as prescribed . Attends all scheduled provider appointments . Calls provider office for new concerns or questions  Please see past updates related to this goal by clicking on the "Past Updates" button in the selected goal      . "I want to reduce my bad habits" (pt-stated)       Current Barriers:  . Financial constraints . Limited social support . Mental Health Concerns  . Substance abuse issues - alcohol use  Clinical Social Work Clinical  Goal(s):  Marland Kitchen Over the next 30 days, client will work with SW to address concerns related to utilizing postive coping skills in times of stress.  Interventions: . Patient interviewed and appropriate assessments performed . Continue to process patient's feelings regarding continued financial difficulties and struggles and efforts to identify employment . 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  Emotional support provided continuing to reinforce positive self care strategies to cope with daily frustrations . Discussed plans with patient for ongoing care management follow up and provided patient with direct contact information for care management team   Patient Self Care Activities:  . Self administers medications as prescribed . Attends all scheduled provider appointments . Calls provider office for new concerns or questions  . Patient able to verbalize motivation for change  Please see past updates related to this goal by clicking on the "Past Updates" button in the selected goal           Follow Up Plan: SW will follow up with patient by phone over the next 2 weeks   [place], [place] Worker  [facility] Care Management [phone]

## 2018-09-30 NOTE — Patient Instructions (Signed)
Thank you allowing the Chronic Care Management Team to be a part of your care! It was a pleasure speaking with you today!  1.Please call your CCM social worker with any questions or concerns regarding your mental health/financial needs.  CCM (Chronic Care Management) Team   Trish Fountain RN, BSN Nurse Care Coordinator  [phone]  Ruben Reason PharmD  Clinical Pharmacist  [phone]   Elliot Gurney, LCSW Clinical Social Worker [phone]  Goals Addressed            This Visit's Progress   . "I want to get my teeth fixed" (pt-stated)       Current Barriers:  . Financial constraints  Clinical Social Work Clinical Goal(s):  Marland Kitchen Over the next 90 days, client will work with SW to address concerns related to his dental health  Interventions: . Discussed plans with patient for ongoing care management follow up and provided patient with direct contact information for care management team  . 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 . Provided patient with positive reinforcement regarding his efforts for self sufficiency . Explored patient's plans to return to school in the fall.      Patient Self Care Activities:  . Self administers medications as prescribed . Attends all scheduled provider appointments . Calls provider office for new concerns or questions  Please see past updates related to this goal by clicking on the "Past Updates" button in the selected goal      . "I want to reduce my bad habits" (pt-stated)       Current Barriers:  . Financial constraints . Limited social support . Mental Health Concerns  . Substance abuse issues - alcohol use  Clinical Social Work Clinical Goal(s):  Marland Kitchen Over the next 30 days, client will work with SW to address concerns related to utilizing postive coping skills in times of  stress.  Interventions: . Patient interviewed and appropriate assessments performed . Continue to process patient's feelings regarding continued financial difficulties and struggles and efforts to identify employment . 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  Emotional support provided continuing to reinforce positive self care strategies to cope with daily frustrations . Discussed plans with patient for ongoing care management follow up and provided patient with direct contact information for care management team   Patient Self Care Activities:  . Self administers medications as prescribed . Attends all scheduled provider appointments . Calls provider office for new concerns or questions  . Patient able to verbalize motivation for change  Please see past updates related to this goal by clicking on the "Past Updates" button in the selected goal           The patient verbalized understanding of instructions provided today and declined a print copy of patient instruction materials.   The care management team will reach out to the patient again over the next 14 days.

## 2018-10-05 ENCOUNTER — Encounter: Payer: BLUE CROSS/BLUE SHIELD | Admitting: Family Medicine

## 2018-10-14 ENCOUNTER — Ambulatory Visit: Payer: Self-pay | Admitting: *Deleted

## 2018-10-14 ENCOUNTER — Telehealth: Payer: Self-pay | Admitting: *Deleted

## 2018-10-14 NOTE — Chronic Care Management (AMB) (Signed)
    Care Management   Unsuccessful Call Note 10/14/2018 Name: Zachary Harper MRN: [id number] DOB: 01-16-1976  Patient is a 43 year old male who sees Steele Sizer, MD for primary care. Dr. Ancil Boozer asked the CCM team to consult the patient for Mental Health Counseling and Resources.      This social worker was unable to reach patient via telephone today for follow up call. I have left HIPAA compliant voicemail asking patient to return my call. (unsuccessful outreach #1).   Plan: Will follow-up within 7 business days via telephone.     Elliot Gurney, [place] Administrator, [facility] [phone]

## 2018-10-21 ENCOUNTER — Telehealth: Payer: Self-pay

## 2018-10-21 ENCOUNTER — Ambulatory Visit: Payer: Self-pay | Admitting: *Deleted

## 2018-10-21 NOTE — Chronic Care Management (AMB) (Signed)
    Care Management   Unsuccessful Call Note 10/21/2018 Name: Hashim Eichhorst MRN: [id number] DOB: Sep 19, 1975  Patient is a 43 year old malewho sees Steele Sizer, MD for primary care. Dr. Ancil Boozer asked the CCM team to consult the patient for Mental Health Counseling and Resources.    This social worker was unable to reach patient via telephone today forfollow up call. Ihave left HIPAA compliant voicemail asking patient to return my call. (unsuccessful outreach #2).     Plan: Will follow-up within 7 business days via telephone.      Elliot Gurney, [place] Administrator, [facility] [phone]

## 2018-10-28 ENCOUNTER — Ambulatory Visit: Payer: Self-pay | Admitting: *Deleted

## 2018-10-28 ENCOUNTER — Telehealth: Payer: Self-pay

## 2018-10-28 NOTE — Chronic Care Management (AMB) (Signed)
    Care Management   Unsuccessful Call Note 10/28/2018 Name: Zachary Harper MRN: [id number] DOB: December 18, 1975  Patientis a 43year old [name], MDfor primary care. Dr. Lenox Ahr the CCM team to consult the patient forMental Health Counseling and Resources.   This social worker was unable to reach patient via telephone today forfollow up call. Ihave left HIPAA compliant voicemail asking patient to return my call. (unsuccessful outreach #3).    Plan: This Education officer, museum will cease further attempts to reach patient at this time, however will be happy to engage patient upon his return call.     Elliot Gurney, [place] Administrator, [facility] [phone]

## 2018-12-01 ENCOUNTER — Encounter: Payer: BC Managed Care – PPO | Admitting: Family Medicine

## 2018-12-03 ENCOUNTER — Ambulatory Visit: Payer: BLUE CROSS/BLUE SHIELD

## 2019-01-17 ENCOUNTER — Ambulatory Visit: Payer: Self-pay | Admitting: *Deleted

## 2019-01-17 NOTE — Chronic Care Management (AMB) (Signed)
Care Management   Social Work Note  01/17/2019 Name: Zachary Harper MRN: [id number] DOB: 23-Jan-1976  Zachary Harper is a 43 y.o. year old male who sees Steele Sizer, MD for primary care. The CCM team was consulted for assistance with Mental Health Counseling and Resources.   Return call to patient after receiving voicemail. Patient did not answer. Voicemail message left requesting a return call.   Outpatient Encounter Medications as of 01/17/2019  Medication Sig  . metoprolol succinate (TOPROL-XL) 25 MG 24 hr tablet Take 1 tablet (25 mg total) by mouth daily. (Patient taking differently: Take 25 mg by mouth every other day. )   No facility-administered encounter medications on file as of 01/17/2019.     Goals Addressed   None     Follow Up Plan: SW will follow up with patient by phone  upon his return call   Elliot Gurney, [place] Worker  [facility] [phone]

## 2019-10-04 ENCOUNTER — Other Ambulatory Visit: Payer: Self-pay

## 2019-10-04 ENCOUNTER — Encounter: Payer: Self-pay | Admitting: *Deleted

## 2019-10-04 DIAGNOSIS — Z79899 Other long term (current) drug therapy: Secondary | ICD-10-CM | POA: Diagnosis not present

## 2019-10-04 DIAGNOSIS — K860 Alcohol-induced chronic pancreatitis: Secondary | ICD-10-CM | POA: Insufficient documentation

## 2019-10-04 DIAGNOSIS — R195 Other fecal abnormalities: Secondary | ICD-10-CM | POA: Diagnosis not present

## 2019-10-04 DIAGNOSIS — I10 Essential (primary) hypertension: Secondary | ICD-10-CM | POA: Diagnosis not present

## 2019-10-04 DIAGNOSIS — F17290 Nicotine dependence, other tobacco product, uncomplicated: Secondary | ICD-10-CM | POA: Insufficient documentation

## 2019-10-04 DIAGNOSIS — R1084 Generalized abdominal pain: Secondary | ICD-10-CM | POA: Diagnosis not present

## 2019-10-04 LAB — COMPREHENSIVE METABOLIC PANEL
ALT: 10 U/L (ref 0–44)
AST: 15 U/L (ref 15–41)
Albumin: 4.4 g/dL (ref 3.5–5.0)
Alkaline Phosphatase: 68 U/L (ref 38–126)
Anion gap: 9 (ref 5–15)
BUN: 8 mg/dL (ref 6–20)
CO2: 27 mmol/L (ref 22–32)
Calcium: 9.8 mg/dL (ref 8.9–10.3)
Chloride: 100 mmol/L (ref 98–111)
Creatinine, Ser: 1.04 mg/dL (ref 0.61–1.24)
GFR calc Af Amer: >60 mL/min (ref >60)
GFR calc non Af Amer: >60 mL/min (ref >60)
Glucose, Bld: 116 mg/dL — ABNORMAL HIGH (ref 70–99)
Potassium: 3.9 mmol/L (ref 3.5–5.1)
Sodium: 136 mmol/L (ref 135–145)
Total Bilirubin: 0.9 mg/dL (ref 0.3–1.2)
Total Protein: 7.9 g/dL (ref 6.5–8.1)

## 2019-10-04 LAB — CBC
HCT: 49.4 % (ref 39.0–52.0)
Hemoglobin: 17.2 g/dL — ABNORMAL HIGH (ref 13.0–17.0)
MCH: 33 pg (ref 26.0–34.0)
MCHC: 34.8 g/dL (ref 30.0–36.0)
MCV: 94.8 fL (ref 80.0–100.0)
Platelets: 548 10*3/uL — ABNORMAL HIGH (ref 150–400)
RBC: 5.21 MIL/uL (ref 4.22–5.81)
RDW: 12.6 % (ref 11.5–15.5)
WBC: 6.6 10*3/uL (ref 4.0–10.5)
nRBC: 0 % (ref 0.0–0.2)

## 2019-10-04 LAB — LIPASE, BLOOD: Lipase: 36 U/L (ref 11–51)

## 2019-10-04 MED ORDER — ONDANSETRON 4 MG PO TBDP: 4.0000 mg | ORAL_TABLET | Freq: Once | ORAL | Status: DC | PRN

## 2019-10-04 NOTE — ED Triage Notes (Addendum)
ED reporting sudden onset of lower abd pain and back pain a "couple hours ago" with vomiting. Pt appears uncomfortable in triage. Pt denies diarrhea or known fevers. No new foods and no one around patient has had similar symptoms.   Hx of Pancreatitis but pt denies having anything to drink for "a while"

## 2019-10-05 ENCOUNTER — Emergency Department: Payer: BC Managed Care – PPO

## 2019-10-05 ENCOUNTER — Emergency Department
Admission: EM | Admit: 2019-10-05 | Discharge: 2019-10-05 | Disposition: A | Discharge status: Home / Self Care | Payer: BC Managed Care – PPO | Attending: Emergency Medicine | Admitting: Emergency Medicine

## 2019-10-05 DIAGNOSIS — K860 Alcohol-induced chronic pancreatitis: Secondary | ICD-10-CM

## 2019-10-05 DIAGNOSIS — I10 Essential (primary) hypertension: Secondary | ICD-10-CM | POA: Diagnosis not present

## 2019-10-05 MED ORDER — KETOROLAC TROMETHAMINE 10 MG PO TABS
10.0000 mg | ORAL_TABLET | Freq: Four times a day (QID) | ORAL | 0 refills | Status: DC | PRN
Start: 2019-10-05 — End: 2020-05-22

## 2019-10-05 MED ORDER — KETOROLAC TROMETHAMINE 30 MG/ML IJ SOLN
30.0000 mg | Freq: Once | INTRAMUSCULAR | Status: AC
  Administered 2019-10-05: 30 mg via INTRAVENOUS
  Filled 2019-10-05: qty 1

## 2019-10-05 MED ORDER — ONDANSETRON HCL 4 MG/2ML IJ SOLN
4.0000 mg | Freq: Once | INTRAMUSCULAR | Status: AC
  Administered 2019-10-05: 4 mg via INTRAVENOUS
  Filled 2019-10-05: qty 2

## 2019-10-05 MED ORDER — IOHEXOL 300 MG/ML  SOLN
100.0000 mL | Freq: Once | INTRAMUSCULAR | Status: AC | PRN
  Administered 2019-10-05: 100 mL via INTRAVENOUS

## 2019-10-05 NOTE — ED Provider Notes (Addendum)
[facility] Emergency Department Provider Note  ____________________________________________   First MD Initiated Contact with Patient 10/05/19 [phone]     (approximate)  I have reviewed the triage vital signs and the nursing notes.   HISTORY  Chief Complaint Abdominal Pain and Emesis    HPI Zachary Harper is a 44 y.o. male with history of alcoholism pancreatitis and hypertension presents to the emergency department secondary to generalized abdominal pain with associated nausea and vomiting which began a couple hours before arrival to the emergency department.  Patient states that current pain score 7 out of 10.  Patient denies any aggravating or alleviating factors.  Patient denies any fever afebrile on presentation.       Past Medical History:  Diagnosis Date  . Hypertension   . Pancreatitis     Patient Active Problem List   Diagnosis Date Noted  . History of alcoholism ([place]) 06/01/2018  . History of pancreatitis 06/01/2018  . HTN (hypertension) 07/05/2011    Past Surgical History:  Procedure Laterality Date  . TYMPANOSTOMY TUBE PLACEMENT Bilateral     Prior to Admission medications   Medication Sig Start Date End Date Taking? Authorizing Provider  ketorolac (TORADOL) 10 MG tablet Take 1 tablet (10 mg total) by mouth every 6 (six) hours as needed. 10/05/19   Gregor Hams, MD  metoprolol succinate (TOPROL-XL) 25 MG 24 hr tablet Take 1 tablet (25 mg total) by mouth daily. Patient taking differently: Take 25 mg by mouth every other day.  06/01/18   Steele Sizer, MD    Allergies Patient has no known allergies.  Family History  Problem Relation Age of Onset  . Hypertension Father   . Diabetes Father   . Hypertension Paternal Grandmother   . Other Sister        Leg Issues  . Depression Sister   . Asthma Son   . Asthma Daughter     Social History Social History   Tobacco Use  . Smoking status: Current Every Day Smoker     Packs/day: 1.00    Years: 6.00    Pack years: 6.00    Types: Cigarettes, Cigars    Start date: 05/31/2012  . Smokeless tobacco: Never Used  Vaping Use  . Vaping Use: Never used  Substance Use Topics  . Alcohol use: Yes    Alcohol/week: 12.0 - 24.0 standard drinks    Types: 12 - 24 Shots of liquor per week    Comment: daily; (08/22/11-reports drinking a fifth in last week, had not drank since getting out of hospital in april  . Drug use: Yes    Types: Marijuana    Review of Systems Constitutional: No fever/chills Eyes: No visual changes. ENT: No sore throat. Cardiovascular: Denies chest pain. Respiratory: Denies shortness of breath. Gastrointestinal: Positive for abdominal pain nausea and vomiting.  No diarrhea.  No constipation. Genitourinary: Negative for dysuria. Musculoskeletal: Negative for neck pain.  Negative for back pain. Integumentary: Negative for rash. Neurological: Negative for headaches, focal weakness or numbness.  ____________________________________________   PHYSICAL EXAM:  VITAL SIGNS: ED Triage Vitals  Enc Vitals Group     BP 10/04/19 2033 (!) 149/97     Pulse Rate 10/04/19 2033 61     Resp 10/04/19 2033 16     Temp 10/04/19 2033 97.7 F (36.5 C)     Temp Source 10/04/19 2033 Oral     SpO2 10/04/19 2033 100 %     Weight 10/04/19 2031 56.7 kg (  125 lb)     Height 10/04/19 2031 1.778 m ('5\' 10"' )     Head Circumference --      Peak Flow --      Pain Score 10/04/19 2031 7     Pain Loc --      Pain Edu? --      Excl. in [place]? --     Constitutional: Alert and oriented.  Eyes: Conjunctivae are normal.  Head: Atraumatic. Mouth/Throat: Patient is wearing a mask. Neck: No stridor.  No meningeal signs.   Cardiovascular: Normal rate, regular rhythm. Good peripheral circulation. Grossly normal heart sounds. Respiratory: Normal respiratory effort.  No retractions. Gastrointestinal: Epigastric/left upper quadrant tenderness to palpation.  No distention.    Musculoskeletal: No lower extremity tenderness nor edema. No gross deformities of extremities. Neurologic:  Normal speech and language. No gross focal neurologic deficits are appreciated.  Skin:  Skin is warm, dry and intact. Psychiatric: Mood and affect are normal. Speech and behavior are normal.  ____________________________________________   LABS (all labs ordered are listed, but only abnormal results are displayed)  Labs Reviewed  COMPREHENSIVE METABOLIC PANEL - Abnormal; Notable for the following components:      Result Value   Glucose, Bld 116 (*)    All other components within normal limits  CBC - Abnormal; Notable for the following components:   Hemoglobin 17.2 (*)    Platelets 548 (*)    All other components within normal limits  LIPASE, BLOOD  URINALYSIS, COMPLETE (UACMP) WITH MICROSCOPIC   ____________________________________________  EKG  ED ECG REPORT I, [place] Frazzini, the attending physician, personally viewed and interpreted this ECG.   Date: 10/04/2019  EKG Time: 8:31 PM  Rate: 63  Rhythm: Normal sinus rhythm  Axis: Normal  Intervals: Normal  ST&T Change: None  ____________________________________________  RADIOLOGY I, [place], personally viewed and evaluated these images (plain radiographs) as part of my medical decision making, as well as reviewing the written report by the radiologist.  ED MD interpretation: Chronic pancreatitis interval increase in the number of lucent lesions within bilateral pelvis per radiologist.  Official radiology report(s): CT ABDOMEN PELVIS W CONTRAST  Result Date: 10/05/2019 CLINICAL DATA:  Abdominal pain EXAM: CT ABDOMEN AND PELVIS WITH CONTRAST TECHNIQUE: Multidetector CT imaging of the abdomen and pelvis was performed using the standard protocol following bolus administration of intravenous contrast. CONTRAST:  147m OMNIPAQUE IOHEXOL 300 MG/ML  SOLN COMPARISON:  July 06, 2011 FINDINGS: Lower chest: The  visualized heart size within normal limits. No pericardial fluid/thickening. No hiatal hernia. The visualized portions of the lungs are clear. Hepatobiliary: The liver is normal in density without focal abnormality.The main portal vein is patent. No evidence of calcified gallstones, gallbladder wall thickening or biliary dilatation. Pancreas: Extensive coarse calcifications are seen throughout the pancreas with mild dilatation of the pancreatic tail duct. No definite surrounding inflammatory changes or loculated fluid collections are noted. Spleen: Normal in size without focal abnormality. Adrenals/Urinary Tract: Both adrenal glands appear normal. The kidneys and collecting system appear normal without evidence of urinary tract calculus or hydronephrosis. Bladder is unremarkable. Stomach/Bowel: The stomach, small bowel, and colon are normal in appearance. No inflammatory changes, wall thickening, or obstructive findings. There is a moderate to large amount of colonic stool without evidence of obstruction.The appendix is normal. Vascular/Lymphatic: There are no enlarged mesenteric, retroperitoneal, or pelvic lymph nodes. No significant vascular findings are present. Reproductive: The prostate is unremarkable. Other: No evidence of abdominal wall mass or hernia. Musculoskeletal:  Again noted are heterogeneous lucent lesions seen throughout the bilateral pelvis, slightly more prominent in number and size than the prior exam. IMPRESSION: 1. Moderate to large amount of colonic stool without evidence of obstruction. 2. Findings suggestive of chronic pancreatitis. 3. Interval increase in number and size of lucent lesion seen within the bilateral pelvis. This could be due to monoclonal gammopathy such as multiple myeloma please correlate the patient's laboratory exam Electronically Signed   By: Prudencio Pair M.D.   On: 10/05/2019 02:32    ____________________________________________   PROCEDURES   Procedure(s)  performed (including Critical Care):  Procedures   ____________________________________________   INITIAL IMPRESSION / MDM / [place] / ED COURSE  As part of my medical decision making, I reviewed the following data within the electronic MEDICAL RECORD NUMBER   44 year old male presented with above-stated history and physical exam a differential diagnosis including but not limited to pancreatitis, diverticulitis colitis gastritis.  CT scan of the abdomen pelvis was performed which revealed evidence of chronic pancreatitis.  CT also revealed multiple lucencies in the patient's pelvis concerning for possible multiple myeloma and as such patient will be referred to Dr. Janese Banks oncology.  Patient was informed of all clinical findings including the areas of concern in his pelvis.  Patient states that he will see Dr. Janese Banks in follow-up.     ____________________________________________  FINAL CLINICAL IMPRESSION(S) / ED DIAGNOSES  Final diagnoses:  Alcohol-induced chronic pancreatitis ([place])     MEDICATIONS GIVEN DURING THIS VISIT:  Medications  ondansetron (ZOFRAN-ODT) disintegrating tablet 4 mg (has no administration in time range)  ondansetron (ZOFRAN) injection 4 mg (4 mg Intravenous Given 10/05/19 0127)  ketorolac (TORADOL) 30 MG/ML injection 30 mg (30 mg Intravenous Given 10/05/19 0127)  iohexol (OMNIPAQUE) 300 MG/ML solution 100 mL (100 mLs Intravenous Contrast Given 10/05/19 0208)     ED Discharge Orders         Ordered    ketorolac (TORADOL) 10 MG tablet  Every 6 hours PRN     Discontinue  Reprint     10/05/19 0328          *Please note:  Yago Ludvigsen was evaluated in Emergency Department on 10/05/2019 for the symptoms described in the history of present illness. He was evaluated in the context of the global COVID-19 pandemic, which necessitated consideration that the patient might be at risk for infection with the SARS-CoV-2 virus that causes COVID-19. Institutional  protocols and algorithms that pertain to the evaluation of patients at risk for COVID-19 are in a state of rapid change based on information released by regulatory bodies including the CDC and federal and state organizations. These policies and algorithms were followed during the patient's care in the ED.  Some ED evaluations and interventions may be delayed as a result of limited staffing during and after the pandemic.*  Note:  This document was prepared using Dragon voice recognition software and may include unintentional dictation errors.   Gregor Hams, MD 10/05/19 2979    Gregor Hams, MD 10/05/19 [phone]

## 2020-05-22 ENCOUNTER — Other Ambulatory Visit: Payer: Self-pay

## 2020-05-22 ENCOUNTER — Ambulatory Visit: Payer: Self-pay | Admitting: Physician Assistant

## 2020-05-22 VITALS — BP 121/86 | HR 90 | Temp 98.5°F | Resp 18 | Ht 70.5 in | Wt 114.0 lb

## 2020-05-22 DIAGNOSIS — R599 Enlarged lymph nodes, unspecified: Secondary | ICD-10-CM

## 2020-05-22 DIAGNOSIS — Z114 Encounter for screening for human immunodeficiency virus [HIV]: Secondary | ICD-10-CM

## 2020-05-22 DIAGNOSIS — Z13228 Encounter for screening for other metabolic disorders: Secondary | ICD-10-CM

## 2020-05-22 DIAGNOSIS — R634 Abnormal weight loss: Secondary | ICD-10-CM

## 2020-05-22 DIAGNOSIS — R1909 Other intra-abdominal and pelvic swelling, mass and lump: Secondary | ICD-10-CM

## 2020-05-22 NOTE — Progress Notes (Signed)
New Patient Office Visit  Subjective:  Patient ID: Zachary Harper, male    DOB: May 05, 1975  Age: 45 y.o. MRN: [id number]  CC:  Chief Complaint  Patient presents with  . Groin Swelling    HPI Zachary Harper reports that he has been feeling a bulge in his right groin, states that it is tender, feels that he can push it back in, states that it has been present for the last year.  Reports that pain is worse with bowel movements.  Reports that he has been taking ibuprofen PM to help him to sleep because of pain is present at night.  Reports that he has had approximately 10 pound weight loss in the last year without effort.     Past Medical History:  Diagnosis Date  . Hypertension   . Pancreatitis     Past Surgical History:  Procedure Laterality Date  . TYMPANOSTOMY TUBE PLACEMENT Bilateral     Family History  Problem Relation Age of Onset  . Hypertension Father   . Diabetes Father   . Hypertension Paternal Grandmother   . Other Sister        Leg Issues  . Depression Sister   . Asthma Son   . Asthma Daughter     Social History   Socioeconomic History  . Marital status: Single    Spouse name: Not on file  . Number of children: 2  . Years of education: Not on file  . Highest education level: Bachelor's degree (e.g., BA, AB, BS)  Occupational History  . Occupation: Unemployed  Tobacco Use  . Smoking status: Current Every Day Smoker    Packs/day: 1.00    Years: 6.00    Pack years: 6.00    Types: Cigarettes, Cigars    Start date: 05/31/2012  . Smokeless tobacco: Never Used  Vaping Use  . Vaping Use: Never used  Substance and Sexual Activity  . Alcohol use: Yes    Alcohol/week: 12.0 - 24.0 standard drinks    Types: 12 - 24 Shots of liquor per week    Comment: daily; (08/22/11-reports drinking a fifth in last week, had not drank since getting out of hospital in april  . Drug use: Yes    Types: Marijuana  . Sexual activity: Not Currently    Partners: Female     Comment: Last intercourse was 2 years ago  Other Topics Concern  . Not on file  Social History Narrative  . Not on file   Social Determinants of Health   Financial Resource Strain: Not on file  Food Insecurity: Not on file  Transportation Needs: Not on file  Physical Activity: Not on file  Stress: Not on file  Social Connections: Not on file  Intimate Partner Violence: Not on file    ROS Review of Systems  Constitutional: Positive for unexpected weight change. Negative for fatigue.  HENT: Negative.   Eyes: Negative.   Respiratory: Negative.   Cardiovascular: Negative.   Gastrointestinal: Negative.   Endocrine: Negative.   Genitourinary: Negative for difficulty urinating, dysuria and genital sores.  Musculoskeletal: Negative.   Skin: Negative.   Allergic/Immunologic: Negative.   Neurological: Negative.   Hematological: Negative.   Psychiatric/Behavioral: Negative.     Objective:   Today's Vitals: BP 121/86 (BP Location: Left Arm, Patient Position: Sitting, Cuff Size: Normal)   Pulse 90   Temp 98.5 F (36.9 C) (Oral)   Resp 18   Ht 5' 10.5" (1.791 m)   Wt 114 lb (51.7  kg)   SpO2 98%   BMI 16.13 kg/m   Physical Exam Vitals and nursing note reviewed.  Constitutional:      Appearance: Normal appearance.     Comments: Very thin  HENT:     Head: Normocephalic and atraumatic.     Right Ear: External ear normal.     Left Ear: External ear normal.     Nose: Nose normal.     Mouth/Throat:     Mouth: Mucous membranes are moist.     Pharynx: Oropharynx is clear.  Eyes:     Extraocular Movements: Extraocular movements intact.     Conjunctiva/sclera: Conjunctivae normal.     Pupils: Pupils are equal, round, and reactive to light.  Cardiovascular:     Rate and Rhythm: Normal rate and regular rhythm.     Pulses: Normal pulses.     Heart sounds: Normal heart sounds.  Pulmonary:     Effort: Pulmonary effort is normal.     Breath sounds: Normal breath sounds.   Chest:  Breasts:     Right: No axillary adenopathy or supraclavicular adenopathy.     Left: No axillary adenopathy or supraclavicular adenopathy.    Genitourinary:   Musculoskeletal:        General: Normal range of motion.     Cervical back: Normal range of motion and neck supple.  Lymphadenopathy:     Head:     Right side of head: Occipital adenopathy present. No submental, submandibular, tonsillar or preauricular adenopathy.     Left side of head: No occipital adenopathy.     Cervical: No cervical adenopathy.     Upper Body:     Right upper body: No supraclavicular, axillary, pectoral or epitrochlear adenopathy.     Left upper body: No supraclavicular, axillary, pectoral or epitrochlear adenopathy.     Lower Body: Right inguinal adenopathy present. No left inguinal adenopathy.  Skin:    General: Skin is warm and dry.  Neurological:     General: No focal deficit present.     Mental Status: He is alert and oriented to person, place, and time.  Psychiatric:        Mood and Affect: Mood normal.        Behavior: Behavior normal.        Thought Content: Thought content normal.        Judgment: Judgment normal.     Assessment & Plan:   Problem List Items Addressed This Visit   None   Visit Diagnoses    Right groin mass    -  Primary   Relevant Orders   CT Abdomen Pelvis W Contrast   Enlarged lymph nodes       Relevant Orders   CT Abdomen Pelvis W Contrast   Loss of weight       Relevant Orders   CT Abdomen Pelvis W Contrast   CBC with Differential/Platelet   Comp. Metabolic Panel (12)   TSH   Screening for metabolic disorder       Relevant Orders   Lipid panel   Screening for HIV without presence of risk factors       Relevant Orders   HIV antibody (with reflex)      Outpatient Encounter Medications as of 05/22/2020  Medication Sig  . [DISCONTINUED] ketorolac (TORADOL) 10 MG tablet Take 1 tablet (10 mg total) by mouth every 6 (six) hours as needed.  .  [DISCONTINUED] metoprolol succinate (TOPROL-XL) 25 MG 24 hr tablet Take 1  tablet (25 mg total) by mouth daily. (Patient taking differently: Take 25 mg by mouth every other day. )   No facility-administered encounter medications on file as of 05/22/2020.  1. Right groin mass [place] sized mobile noted, lymph nodes palpable in the groin area on right side.  Patient appears very thin.  Previous CT completed in July 2021  CLINICAL DATA:  Abdominal pain  EXAM: CT ABDOMEN AND PELVIS WITH CONTRAST  TECHNIQUE: Multidetector CT imaging of the abdomen and pelvis was performed using the standard protocol following bolus administration of intravenous contrast.  CONTRAST:  175m OMNIPAQUE IOHEXOL 300 MG/ML  SOLN  COMPARISON:  July 06, 2011  FINDINGS: Lower chest: The visualized heart size within normal limits. No pericardial fluid/thickening.  No hiatal hernia.  The visualized portions of the lungs are clear.  Hepatobiliary: The liver is normal in density without focal abnormality.The main portal vein is patent. No evidence of calcified gallstones, gallbladder wall thickening or biliary dilatation.  Pancreas: Extensive coarse calcifications are seen throughout the pancreas with mild dilatation of the pancreatic tail duct. No definite surrounding inflammatory changes or loculated fluid collections are noted.  Spleen: Normal in size without focal abnormality.  Adrenals/Urinary Tract: Both adrenal glands appear normal. The kidneys and collecting system appear normal without evidence of urinary tract calculus or hydronephrosis. Bladder is unremarkable.  Stomach/Bowel: The stomach, small bowel, and colon are normal in appearance. No inflammatory changes, wall thickening, or obstructive findings. There is a moderate to large amount of colonic stool without evidence of obstruction.The appendix is normal.  Vascular/Lymphatic: There are no enlarged mesenteric, retroperitoneal,  or pelvic lymph nodes. No significant vascular findings are present.  Reproductive: The prostate is unremarkable.  Other: No evidence of abdominal wall mass or hernia.  Musculoskeletal: Again noted are heterogeneous lucent lesions seen throughout the bilateral pelvis, slightly more prominent in number and size than the prior exam.  IMPRESSION: 1. Moderate to large amount of colonic stool without evidence of obstruction. 2. Findings suggestive of chronic pancreatitis. 3. Interval increase in number and size of lucent lesion seen within the bilateral pelvis. This could be due to monoclonal gammopathy such as multiple myeloma please correlate the patient's laboratory exam   Electronically Signed   By: BPrudencio PairM.D.   On: 10/05/2019 02:32 - CT Abdomen Pelvis W Contrast; Future  Patient was unable to follow-up due to financial constraints..  Patient has had 10% of body weight loss last year, without effort.  Patient to return to the mobile unit tomorrow morning for fasting labs.  Patient will be assigned primary care provider 2. Enlarged lymph nodes  - CT Abdomen Pelvis W Contrast; Future  3. Loss of weight  - CT Abdomen Pelvis W Contrast; Future - CBC with Differential/Platelet; Future - Comp. Metabolic Panel (12); Future - TSH; Future  4. Screening for metabolic disorder  - Lipid panel; Future  5. Screening for HIV without presence of risk factors  - HIV antibody (with reflex); Future   I have reviewed the patient's medical history (PMH, PSH, Social History, Family History, Medications, and allergies) , and have been updated if relevant. I spent 30 minutes reviewing chart and  face to face time with patient.     Follow-up: Return in about 1 day (around 05/23/2020) for Fasting  labs.   CLoraine GripMayers, PA-C

## 2020-05-22 NOTE — Progress Notes (Signed)
Patient does not taken any chronic medication. Patient reports taking ibuprofen for pain.  Patient reports having a lump in the right side groin area for years with tenderness.

## 2020-05-22 NOTE — Patient Instructions (Addendum)
Please return to the mobile medicine unit tomorrow morning for fasting labs.  We have started an order for you to get a CAT scan of your pelvic area, we will call you with the time and location of that scan.  For your drainage and ear discomfort, I recommend that you start taking Zyrtec over-the-counter on a daily basis  Once your tests are resulted, we will call you with those results.  Please let us know if there is anything else we can do for you  Kennieth Rad, PA-C Physician Assistant [place] [email or web address]   Health Maintenance, Male Adopting a healthy lifestyle and getting preventive care are important in promoting health and wellness. Ask your health care provider about:  The right schedule for you to have regular tests and exams.  Things you can do on your own to prevent diseases and keep yourself healthy. What should I know about diet, weight, and exercise? Eat a healthy diet  Eat a diet that includes plenty of vegetables, fruits, low-fat dairy products, and lean protein.  Do not eat a lot of foods that are high in solid fats, added sugars, or sodium.   Maintain a healthy weight Body mass index (BMI) is a measurement that can be used to identify possible weight problems. It estimates body fat based on height and weight. Your health care provider can help determine your BMI and help you achieve or maintain a healthy weight. Get regular exercise Get regular exercise. This is one of the most important things you can do for your health. Most adults should:  Exercise for at least 150 minutes each week. The exercise should increase your heart rate and make you sweat (moderate-intensity exercise).  Do strengthening exercises at least twice a week. This is in addition to the moderate-intensity exercise.  Spend less time sitting. Even light physical activity can be beneficial. Watch cholesterol and blood lipids Have  your blood tested for lipids and cholesterol at 46 years of age, then have this test every 5 years. You may need to have your cholesterol levels checked more often if:  Your lipid or cholesterol levels are high.  You are older than 46 years of age.  You are at high risk for heart disease. What should I know about cancer screening? Many types of cancers can be detected early and may often be prevented. Depending on your health history and family history, you may need to have cancer screening at various ages. This may include screening for:  Colorectal cancer.  Prostate cancer.  Skin cancer.  Lung cancer. What should I know about heart disease, diabetes, and high blood pressure? Blood pressure and heart disease  High blood pressure causes heart disease and increases the risk of stroke. This is more likely to develop in people who have high blood pressure readings, are of African descent, or are overweight.  Talk with your health care provider about your target blood pressure readings.  Have your blood pressure checked: ? Every 3-5 years if you are 60-56 years of age. ? Every year if you are 34 years old or older.  If you are between the ages of 50 and 22 and are a current or former smoker, ask your health care provider if you should have a one-time screening for abdominal aortic aneurysm (AAA). Diabetes Have regular diabetes screenings. This checks your fasting blood sugar level. Have the screening done:  Once every three years after age 32 if you are at a normal weight  and have a low risk for diabetes.  More often and at a younger age if you are overweight or have a high risk for diabetes. What should I know about preventing infection? Hepatitis B If you have a higher risk for hepatitis B, you should be screened for this virus. Talk with your health care provider to find out if you are at risk for hepatitis B infection. Hepatitis C Blood testing is recommended for:  Everyone  born from [birth year] through 1966.  Anyone with known risk factors for hepatitis C. Sexually transmitted infections (STIs)  You should be screened each year for STIs, including gonorrhea and chlamydia, if: ? You are sexually active and are younger than 46 years of age. ? You are older than 46 years of age and your health care provider tells you that you are at risk for this type of infection. ? Your sexual activity has changed since you were last screened, and you are at increased risk for chlamydia or gonorrhea. Ask your health care provider if you are at risk.  Ask your health care provider about whether you are at high risk for HIV. Your health care provider may recommend a prescription medicine to help prevent HIV infection. If you choose to take medicine to prevent HIV, you should first get tested for HIV. You should then be tested every 3 months for as long as you are taking the medicine. Follow these instructions at home: Lifestyle  Do not use any products that contain nicotine or tobacco, such as cigarettes, e-cigarettes, and chewing tobacco. If you need help quitting, ask your health care provider.  Do not use street drugs.  Do not share needles.  Ask your health care provider for help if you need support or information about quitting drugs. Alcohol use  Do not drink alcohol if your health care provider tells you not to drink.  If you drink alcohol: ? Limit how much you have to 0-2 drinks a day. ? Be aware of how much alcohol is in your drink. In the [place] one drink equals one 12 oz bottle of beer (355 mL), one 5 oz glass of wine (148 mL), or one 1 oz glass of hard liquor (44 mL). General instructions  Schedule regular health, dental, and eye exams.  Stay current with your vaccines.  Tell your health care provider if: ? You often feel depressed. ? You have ever been abused or do not feel safe at home. Summary  Adopting a healthy lifestyle and getting preventive care are  important in promoting health and wellness.  Follow your health care provider's instructions about healthy diet, exercising, and getting tested or screened for diseases.  Follow your health care provider's instructions on monitoring your cholesterol and blood pressure. This information is not intended to replace advice given to you by your health care provider. Make sure you discuss any questions you have with your health care provider. Document Revised: 02/24/2018 Document Reviewed: 02/24/2018 Elsevier Patient Education  2021 [employer].

## 2020-05-23 ENCOUNTER — Other Ambulatory Visit: Payer: Self-pay

## 2020-05-23 DIAGNOSIS — R599 Enlarged lymph nodes, unspecified: Secondary | ICD-10-CM

## 2020-05-23 DIAGNOSIS — Z13228 Encounter for screening for other metabolic disorders: Secondary | ICD-10-CM

## 2020-05-23 DIAGNOSIS — Z114 Encounter for screening for human immunodeficiency virus [HIV]: Secondary | ICD-10-CM

## 2020-05-23 DIAGNOSIS — R634 Abnormal weight loss: Secondary | ICD-10-CM | POA: Insufficient documentation

## 2020-05-23 DIAGNOSIS — R1909 Other intra-abdominal and pelvic swelling, mass and lump: Secondary | ICD-10-CM

## 2020-05-23 HISTORY — DX: Other intra-abdominal and pelvic swelling, mass and lump: R19.09

## 2020-05-23 HISTORY — DX: Enlarged lymph nodes, unspecified: R59.9

## 2020-05-23 HISTORY — DX: Abnormal weight loss: R63.4

## 2020-05-23 NOTE — Progress Notes (Signed)
Patient tolerated injection well today 

## 2020-05-24 LAB — COMP. METABOLIC PANEL (12)
AST: 12 IU/L (ref 0–40)
Albumin/Globulin Ratio: 1.6 (ref 1.2–2.2)
Albumin: 4.1 g/dL (ref 4.0–5.0)
Alkaline Phosphatase: 73 IU/L (ref 44–121)
BUN/Creatinine Ratio: 24 — ABNORMAL HIGH (ref 9–20)
BUN: 24 mg/dL (ref 6–24)
Bilirubin Total: 0.2 mg/dL (ref 0.0–1.2)
Calcium: 9.5 mg/dL (ref 8.7–10.2)
Chloride: 110 mmol/L — ABNORMAL HIGH (ref 96–106)
Creatinine, Ser: 0.99 mg/dL (ref 0.76–1.27)
Globulin, Total: 2.5 g/dL (ref 1.5–4.5)
Glucose: 105 mg/dL — ABNORMAL HIGH (ref 65–99)
Potassium: 4.6 mmol/L (ref 3.5–5.2)
Sodium: 143 mmol/L (ref 134–144)
Total Protein: 6.6 g/dL (ref 6.0–8.5)
eGFR: 96 mL/min/{1.73_m2} (ref >59)

## 2020-05-24 LAB — LIPID PANEL
Chol/HDL Ratio: 2.4 ratio (ref 0.0–5.0)
Cholesterol, Total: 139 mg/dL (ref 100–199)
HDL: 57 mg/dL (ref >39)
LDL Chol Calc (NIH): 68 mg/dL (ref 0–99)
Triglycerides: 72 mg/dL (ref 0–149)
VLDL Cholesterol Cal: 14 mg/dL (ref 5–40)

## 2020-05-24 LAB — CBC WITH DIFFERENTIAL/PLATELET
Basophils Absolute: 0.1 10*3/uL (ref 0.0–0.2)
Basos: 2 %
EOS (ABSOLUTE): 0.4 10*3/uL (ref 0.0–0.4)
Eos: 6 %
Hematocrit: 40.2 % (ref 37.5–51.0)
Hemoglobin: 14.1 g/dL (ref 13.0–17.7)
Immature Grans (Abs): 0 10*3/uL (ref 0.0–0.1)
Immature Granulocytes: 0 %
Lymphocytes Absolute: 2.7 10*3/uL (ref 0.7–3.1)
Lymphs: 44 %
MCH: 31.8 pg (ref 26.6–33.0)
MCHC: 35.1 g/dL (ref 31.5–35.7)
MCV: 91 fL (ref 79–97)
Monocytes Absolute: 0.5 10*3/uL (ref 0.1–0.9)
Monocytes: 7 %
Neutrophils Absolute: 2.5 10*3/uL (ref 1.4–7.0)
Neutrophils: 41 %
Platelets: 464 10*3/uL — ABNORMAL HIGH (ref 150–450)
RBC: 4.43 x10E6/uL (ref 4.14–5.80)
RDW: 12.3 % (ref 11.6–15.4)
WBC: 6.1 10*3/uL (ref 3.4–10.8)

## 2020-05-24 LAB — TSH: TSH: 1.05 u[IU]/mL (ref 0.450–4.500)

## 2020-05-24 LAB — HIV ANTIBODY (ROUTINE TESTING W REFLEX)

## 2020-05-28 ENCOUNTER — Telehealth: Payer: Self-pay | Admitting: *Deleted

## 2020-05-28 NOTE — Telephone Encounter (Signed)
-----   Message from Kennieth Rad, [place] sent at 05/24/2020  5:03 PM EST ----- Please call patient and let him know that his kidney function, liver function and thyroid function are within normal limits.  He does not show any signs of anemia.  His HIV screening was not completed due to insufficient specimen.

## 2020-05-28 NOTE — Telephone Encounter (Signed)
MA UTR patient due to phone being disconnected.

## 2020-05-29 ENCOUNTER — Telehealth: Payer: Self-pay | Admitting: *Deleted

## 2020-05-29 NOTE — Telephone Encounter (Signed)
MA UTR patient due to phone being disconnected. Please ask patient to return for blood test and provide follow up information for changing his DOB with his insurance so that we may move forward with scheduling his imaging.

## 2020-05-29 NOTE — Telephone Encounter (Signed)
-----   Message from Kennieth Rad, [place] sent at 05/24/2020  5:03 PM EST ----- Please call patient and let him know that his kidney function, liver function and thyroid function are within normal limits.  He does not show any signs of anemia.  His HIV screening was not completed due to insufficient specimen.

## 2020-05-30 ENCOUNTER — Encounter: Payer: Self-pay | Admitting: *Deleted

## 2020-06-06 ENCOUNTER — Telehealth: Payer: Self-pay | Admitting: *Deleted

## 2020-06-06 NOTE — Telephone Encounter (Signed)
Patient verified DOB Patient is aware of labs being normal and needing to return to the unit for an HIV test due to insufficient amount of blood. Patient also is in the process of correcting his DOB with his HR for his insurance. Patient will return to [facility] on 06/13/20 for a lab draw and update on DOB so that his procedure may be approved and scheduled.

## 2020-06-13 ENCOUNTER — Ambulatory Visit (HOSPITAL_COMMUNITY): Payer: 59

## 2020-06-13 ENCOUNTER — Other Ambulatory Visit: Payer: Self-pay

## 2020-06-13 ENCOUNTER — Other Ambulatory Visit: Payer: Medicaid Other

## 2020-06-13 DIAGNOSIS — R634 Abnormal weight loss: Secondary | ICD-10-CM

## 2020-06-14 LAB — HIV ANTIBODY (ROUTINE TESTING W REFLEX): HIV Screen 4th Generation wRfx: NONREACTIVE

## 2020-06-15 ENCOUNTER — Ambulatory Visit (HOSPITAL_COMMUNITY): Payer: 59

## 2020-06-18 ENCOUNTER — Telehealth: Payer: Self-pay | Admitting: *Deleted

## 2020-06-18 NOTE — Telephone Encounter (Signed)
Medical Assistant left message on patient's home and cell voicemail. Voicemail states to give a call back to [place] with [facility] at [phone].

## 2020-06-18 NOTE — Telephone Encounter (Signed)
-----   Message from Kennieth Rad, [place] sent at 06/14/2020 11:36 AM EDT ----- Please call patient and let him know that his screening for HIV was negative.

## 2020-06-18 NOTE — Telephone Encounter (Signed)
Patient verified DOB Patient is aware of CT being scheduled for Wednesday at 11:15. Patient will pick up contrast on tomorrow to drink Wednesday at 9:30/10:30 and arrival for scan at 11:15.

## 2020-06-18 NOTE — Telephone Encounter (Signed)
Patient was made aware of results while discussing imaging need with MA

## 2020-06-20 ENCOUNTER — Other Ambulatory Visit: Payer: Self-pay

## 2020-06-20 ENCOUNTER — Telehealth: Payer: Self-pay | Admitting: *Deleted

## 2020-06-20 ENCOUNTER — Ambulatory Visit (HOSPITAL_COMMUNITY)
Admission: RE | Admit: 2020-06-20 | Discharge: 2020-06-20 | Disposition: A | Discharge status: Home / Self Care | Payer: 59 | Source: Ambulatory Visit | Attending: Physician Assistant | Admitting: Physician Assistant

## 2020-06-20 DIAGNOSIS — R1909 Other intra-abdominal and pelvic swelling, mass and lump: Secondary | ICD-10-CM | POA: Diagnosis present

## 2020-06-20 DIAGNOSIS — R599 Enlarged lymph nodes, unspecified: Secondary | ICD-10-CM | POA: Insufficient documentation

## 2020-06-20 DIAGNOSIS — R634 Abnormal weight loss: Secondary | ICD-10-CM | POA: Insufficient documentation

## 2020-06-20 MED ORDER — IOHEXOL 300 MG/ML  SOLN
100.0000 mL | Freq: Once | INTRAMUSCULAR | Status: AC | PRN
  Administered 2020-06-20: 100 mL via INTRAVENOUS

## 2020-06-20 NOTE — Telephone Encounter (Signed)
Patient verified DOB Patient is aware of HIV being negative. Patient was mae aware of undefined mass noted and hernia needing [place].  Patient is also aware of MA speaking with [employer] and confirming no additional information is needed to support the approved CT. Patient will await MA contacting [employer] for [place] approval and scheduling

## 2020-06-20 NOTE — Addendum Note (Signed)
Addended by: Kennieth Rad on: 06/20/2020 02:11 PM   Modules accepted: Orders

## 2020-06-20 NOTE — Telephone Encounter (Signed)
-----   Message from Kennieth Rad, [place] sent at 06/20/2020  2:10 PM EDT ----- Please call patient and let him know that his CT did show an undefined mass in his right groin area.  Recommendation from radiologist is to follow-up with an ultrasound for further evaluation.  1. New indeterminate low-density right inguinal mass, without definite communication with the peritoneal cavity or associated distal small bowel obstruction. The administered enteric contrast has not passed beyond this level into the terminal ileum or colon. This does not have the typical appearance of an inguinal hernia containing small bowel and may reflect a loculated hydrocele or other atypical fluid collection. Consider further evaluation with ultrasound. Clinical follow-up necessary to exclude atypical incarcerated hernia. 2. 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 3. These results will be called to the ordering clinician or representative by the Radiology Department at the imaging location.   Electronically Signed   By: Richardean Sale M.D.   On: 06/20/2020 13:02

## 2020-07-02 ENCOUNTER — Telehealth: Payer: Self-pay | Admitting: *Deleted

## 2020-07-02 NOTE — Telephone Encounter (Signed)
Patient verified DOB Patient has been made aware of no PA being required for US of the pelvis. Patient will arrive to [facility] on 07/08/20 at 3:15 with a full bladder to complete the scan. Patient reports area has "gone down" in the groin and is aware of imaging sharing what type of mass is being noted.

## 2020-07-05 ENCOUNTER — Ambulatory Visit (HOSPITAL_COMMUNITY)
Admission: RE | Admit: 2020-07-05 | Discharge: 2020-07-05 | Disposition: A | Discharge status: Home / Self Care | Payer: 59 | Source: Ambulatory Visit | Attending: Physician Assistant | Admitting: Physician Assistant

## 2020-07-05 ENCOUNTER — Other Ambulatory Visit: Payer: Self-pay

## 2020-07-05 DIAGNOSIS — R1909 Other intra-abdominal and pelvic swelling, mass and lump: Secondary | ICD-10-CM | POA: Diagnosis present

## 2020-07-12 ENCOUNTER — Other Ambulatory Visit: Payer: Self-pay | Admitting: Physician Assistant

## 2020-07-12 DIAGNOSIS — R1909 Other intra-abdominal and pelvic swelling, mass and lump: Secondary | ICD-10-CM

## 2020-07-16 ENCOUNTER — Telehealth: Payer: Self-pay | Admitting: *Deleted

## 2020-07-16 NOTE — Telephone Encounter (Signed)
Patient verified DOB Patient is aware of [place] confirming Mass noted on CT and needing to complete an US of the scrotum. Patient is scheduled for [place] on 5/5 at 12:30. Patient is also requesting a colonoscopy.

## 2020-07-19 ENCOUNTER — Ambulatory Visit (HOSPITAL_COMMUNITY): Payer: 59

## 2020-07-23 ENCOUNTER — Other Ambulatory Visit: Payer: Self-pay

## 2020-07-23 ENCOUNTER — Ambulatory Visit (HOSPITAL_COMMUNITY)
Admission: RE | Admit: 2020-07-23 | Discharge: 2020-07-23 | Disposition: A | Discharge status: Home / Self Care | Payer: 59 | Source: Ambulatory Visit | Attending: Physician Assistant | Admitting: Physician Assistant

## 2020-07-23 DIAGNOSIS — R1909 Other intra-abdominal and pelvic swelling, mass and lump: Secondary | ICD-10-CM | POA: Insufficient documentation

## 2020-07-24 NOTE — Telephone Encounter (Signed)
Patient aware of needing to complete current imagining and screening for colon cancer can be addressed after.

## 2020-07-26 ENCOUNTER — Telehealth: Payer: Self-pay | Admitting: *Deleted

## 2020-07-26 NOTE — Telephone Encounter (Signed)
Patient verified DOB Patient is aware of imagining showing hydrocele with possible resolved.  Patient will report to [facility] on Monday for further evaluation of weight loss.

## 2020-07-26 NOTE — Telephone Encounter (Signed)
-----   Message from Kennieth Rad, [place] sent at 07/25/2020  4:39 PM EDT ----- Please call patient and ask him to return to the mobile unit so we can discuss the results and next steps.  Okay to share with him the results of the ultrasound.

## 2020-07-30 ENCOUNTER — Ambulatory Visit: Payer: 59 | Admitting: Physician Assistant

## 2020-07-30 ENCOUNTER — Other Ambulatory Visit: Payer: Self-pay

## 2020-07-30 VITALS — BP 126/92 | HR 88 | Temp 98.2°F | Resp 18 | Ht 70.5 in | Wt 115.0 lb

## 2020-07-30 DIAGNOSIS — A048 Other specified bacterial intestinal infections: Secondary | ICD-10-CM

## 2020-07-30 DIAGNOSIS — Z8719 Personal history of other diseases of the digestive system: Secondary | ICD-10-CM

## 2020-07-30 DIAGNOSIS — R1909 Other intra-abdominal and pelvic swelling, mass and lump: Secondary | ICD-10-CM | POA: Diagnosis not present

## 2020-07-30 DIAGNOSIS — R1084 Generalized abdominal pain: Secondary | ICD-10-CM

## 2020-07-30 DIAGNOSIS — R599 Enlarged lymph nodes, unspecified: Secondary | ICD-10-CM

## 2020-07-30 DIAGNOSIS — R634 Abnormal weight loss: Secondary | ICD-10-CM | POA: Diagnosis not present

## 2020-07-30 DIAGNOSIS — Z1283 Encounter for screening for malignant neoplasm of skin: Secondary | ICD-10-CM

## 2020-07-30 DIAGNOSIS — L309 Dermatitis, unspecified: Secondary | ICD-10-CM

## 2020-07-30 MED ORDER — PANTOPRAZOLE SODIUM 40 MG PO TBEC: 40.0000 mg | DELAYED_RELEASE_TABLET | Freq: Every day | ORAL | 3 refills | Status: DC

## 2020-07-30 MED ORDER — TRIAMCINOLONE ACETONIDE 0.5 % EX OINT: 1.0000 "application " | TOPICAL_OINTMENT | Freq: Two times a day (BID) | CUTANEOUS | 0 refills | Status: DC

## 2020-07-30 NOTE — Progress Notes (Signed)
Established Patient Office Visit  Subjective:  Patient ID: Zachary Harper, male    DOB: [birth year]  Age: 45 y.o. MRN: [id number]  CC:  Chief Complaint  Patient presents with  . Mass    HPI Zachary Harper presents to discuss imaging results.  Reports that he continues to have difficulty regaining weight, states that he was 125 pounds in July 2021,  had a 10 pound weight loss after being diagnosed with chronic pancreatitis.  Reports that he does eat a mostly vegan/vegetarian diet, generally will only eat 1 meal a day.  Reports that he has been working on increasing his snacks, but has difficulty eating, states that he experiences abdominal discomfort after almost every meal.  Reports that he will use ibuprofen to help with the pain, but does endorse that he sometimes has to lay down and interrupts his workday.  Endorses that he experiences excessive gas, will also episodes of constipation.  Reports that he has history of anal fissure, did have colonoscopy in 2014 for rectal bleeding.  Reports that he is drinking approximately 2 bottles of water a day, rare alcohol use, does endorse marijuana use, states that it helps him with his appetite and anxiety.  Reports that he has a small lesion on the back of his scalp, states that his barber has noticed that that it is getting a little bigger.  Reports that he has been using Lotrimin and Neosporin on it without resolve.  Reports he understands all of the imaging results, does endorse that the right groin mass does bother him when he is working out, states that it does feel that it moves and causes discomfort.   Past Medical History:  Diagnosis Date  . Hypertension   . Pancreatitis     Past Surgical History:  Procedure Laterality Date  . TYMPANOSTOMY TUBE PLACEMENT Bilateral     Family History  Problem Relation Age of Onset  . Hypertension Father   . Diabetes Father   . Hypertension Paternal Grandmother   . Other Sister         Leg Issues  . Depression Sister   . Asthma Son   . Asthma Daughter     Social History   Socioeconomic History  . Marital status: Single    Spouse name: Not on file  . Number of children: 2  . Years of education: Not on file  . Highest education level: Bachelor's degree (e.g., BA, AB, BS)  Occupational History  . Occupation: Unemployed  Tobacco Use  . Smoking status: Current Every Day Smoker    Packs/day: 1.00    Years: 6.00    Pack years: 6.00    Types: Cigarettes, Cigars    Start date: 05/31/2012  . Smokeless tobacco: Never Used  Vaping Use  . Vaping Use: Never used  Substance and Sexual Activity  . Alcohol use: Yes    Alcohol/week: 12.0 - 24.0 standard drinks    Types: 12 - 24 Shots of liquor per week    Comment: daily; (08/22/11-reports drinking a fifth in last week, had not drank since getting out of hospital in april  . Drug use: Yes    Types: Marijuana  . Sexual activity: Not Currently    Partners: Female    Comment: Last intercourse was 2 years ago  Other Topics Concern  . Not on file  Social History Narrative  . Not on file   Social Determinants of Health   Financial Resource Strain: Not on file  Food Insecurity:  Not on file  Transportation Needs: Not on file  Physical Activity: Not on file  Stress: Not on file  Social Connections: Not on file  Intimate Partner Violence: Not on file    No outpatient medications prior to visit.   No facility-administered medications prior to visit.    No Known Allergies  ROS Review of Systems  Constitutional: Negative for chills and fever.  HENT: Negative.   Eyes: Negative.   Respiratory: Negative for shortness of breath.   Cardiovascular: Negative for chest pain.  Gastrointestinal: Positive for abdominal pain and constipation. Negative for blood in stool.  Endocrine: Negative.   Genitourinary: Negative for dysuria, genital sores, penile discharge, penile pain, scrotal swelling and testicular pain.   Musculoskeletal: Negative.   Skin: Negative.   Allergic/Immunologic: Negative.   Neurological: Negative.   Hematological: Negative.   Psychiatric/Behavioral: Negative.       Objective:    Physical Exam Vitals and nursing note reviewed.  Constitutional:      Appearance: Normal appearance.  HENT:     Head: Normocephalic and atraumatic.     Right Ear: External ear normal.     Left Ear: External ear normal.     Nose: Nose normal.     Mouth/Throat:     Mouth: Mucous membranes are moist.     Pharynx: Oropharynx is clear.  Eyes:     Extraocular Movements: Extraocular movements intact.     Conjunctiva/sclera: Conjunctivae normal.     Pupils: Pupils are equal, round, and reactive to light.  Cardiovascular:     Rate and Rhythm: Normal rate and regular rhythm.     Pulses: Normal pulses.     Heart sounds: Normal heart sounds.  Pulmonary:     Effort: Pulmonary effort is normal.     Breath sounds: Normal breath sounds.  Musculoskeletal:        General: Normal range of motion.     Cervical back: Normal range of motion and neck supple.  Skin:    General: Skin is warm and dry.       Neurological:     General: No focal deficit present.     Mental Status: He is alert and oriented to person, place, and time.  Psychiatric:        Mood and Affect: Mood normal.        Behavior: Behavior normal.        Thought Content: Thought content normal.        Judgment: Judgment normal.     BP (!) 126/92 (BP Location: Left Arm, Patient Position: Sitting, Cuff Size: Normal)   Pulse 88   Temp 98.2 F (36.8 C) (Oral)   Resp 18   Ht 5' 10.5" (1.791 m)   Wt 115 lb (52.2 kg)   SpO2 100%   BMI 16.27 kg/m  Wt Readings from Last 3 Encounters:  07/30/20 115 lb (52.2 kg)  05/22/20 114 lb (51.7 kg)  10/04/19 125 lb (56.7 kg)     Health Maintenance Due  Topic Date Due  . COLONOSCOPY (Pts 45-20yr Insurance coverage will need to be confirmed)  Never done  . COVID-19 Vaccine (3 - Booster for  Pfizer series) 07/07/2020    There are no preventive care reminders to display for this patient.  Lab Results  Component Value Date   TSH 1.050 05/23/2020   Lab Results  Component Value Date   WBC 6.1 05/23/2020   HGB 14.1 05/23/2020   HCT 40.2 05/23/2020   MCV 91  05/23/2020   PLT 464 (H) 05/23/2020   Lab Results  Component Value Date   NA 143 05/23/2020   K 4.6 05/23/2020   CO2 27 10/04/2019   GLUCOSE 105 (H) 05/23/2020   BUN 24 05/23/2020   CREATININE 0.99 05/23/2020   BILITOT 0.2 05/23/2020   ALKPHOS 73 05/23/2020   AST 12 05/23/2020   ALT 10 10/04/2019   PROT 6.6 05/23/2020   ALBUMIN 4.1 05/23/2020   CALCIUM 9.5 05/23/2020   ANIONGAP 9 10/04/2019   EGFR 96 05/23/2020   Lab Results  Component Value Date   CHOL 139 05/23/2020   Lab Results  Component Value Date   HDL 57 05/23/2020   Lab Results  Component Value Date   LDLCALC 68 05/23/2020   Lab Results  Component Value Date   TRIG 72 05/23/2020   Lab Results  Component Value Date   CHOLHDL 2.4 05/23/2020   Lab Results  Component Value Date   HGBA1C 5.4 06/01/2018      Assessment & Plan:   Problem List Items Addressed This Visit      Immune and Lymphatic   Enlarged lymph nodes     Other   History of pancreatitis   Relevant Orders   Ambulatory referral to Gastroenterology   Right groin mass - Primary   Relevant Orders   Ambulatory referral to General Surgery   Loss of weight   Relevant Orders   Ambulatory referral to Gastroenterology    Other Visit Diagnoses    Generalized abdominal pain       Relevant Medications   pantoprazole (PROTONIX) 40 MG tablet   Other Relevant Orders   Ambulatory referral to Gastroenterology   H Pylori, IGM, IGG, IGA AB   Screening for skin cancer       Relevant Orders   Ambulatory referral to Dermatology   Dermatitis       Relevant Medications   triamcinolone ointment (KENALOG) 0.5 %    1. Right groin mass  IMPRESSION: 1. New indeterminate  low-density right inguinal mass, without definite communication with the peritoneal cavity or associated distal small bowel obstruction. The administered enteric contrast has not passed beyond this level into the terminal ileum or colon. This does not have the typical appearance of an inguinal hernia containing small bowel and may reflect a loculated hydrocele or other atypical fluid collection. Consider further evaluation with ultrasound. Clinical follow-up necessary to exclude atypical incarcerated hernia. 2. 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 3. These results will be called to the ordering clinician or representative by the Radiology Department at the imaging location.   Electronically Signed   By: Richardean Sale M.D.   On: 06/20/2020 13:02   IMPRESSION: Corresponding to the finding seen on the patient's recent CT is a complex cystic mass in the right inguinal region. The differential diagnosis is broad and includes an abscess, necrotic lymph node, complex hydrocele/spermatocele or a high riding testicle with a complex cystic mass. A dedicated scrotal ultrasound is recommended to document a normal appearing right testicle.   Electronically Signed   By: Constance Holster M.D.   On: 07/06/2020 14:40   IMPRESSION: Normal appearing testes and epididymi.  Small to moderate LEFT and trace RIGHT hydroceles.  No significant fluid collection is seen along the RIGHT inguinal canal.  This could represent hydrocele or a small amount of ascites in a patent inguinal canal, which has since resolved.   Electronically Signed   By: Crist Infante.D.  On: 07/24/2020 16:54 - Ambulatory referral to General Surgery  2. Enlarged lymph nodes   3. Loss of weight Encourage patient to monitor caloric intake, trial Protonix, increase hydration, fiber intake - Ambulatory referral to Gastroenterology  4. History of pancreatitis  - Ambulatory referral  to Gastroenterology  5. Generalized abdominal pain  - Ambulatory referral to Gastroenterology - H Pylori, IGM, IGG, IGA AB - pantoprazole (PROTONIX) 40 MG tablet; Take 1 tablet (40 mg total) by mouth daily.  Dispense: 30 tablet; Refill: 3  6. Screening for skin cancer  - Ambulatory referral to Dermatology  7. Dermatitis Trial Kenalog - triamcinolone ointment (KENALOG) 0.5 %; Apply 1 application topically 2 (two) times daily.  Dispense: 30 g; Refill: 0   Meds ordered this encounter  Medications  . pantoprazole (PROTONIX) 40 MG tablet    Sig: Take 1 tablet (40 mg total) by mouth daily.    Dispense:  30 tablet    Refill:  3    Order Specific Question:   Supervising Provider    Answer:   Joya Gaskins, PATRICK E [[id number]]  . triamcinolone ointment (KENALOG) 0.5 %    Sig: Apply 1 application topically 2 (two) times daily.    Dispense:  30 g    Refill:  0    Order Specific Question:   Supervising Provider    Answer:   Asencion Noble E [[id number]]    I have reviewed the patient's medical history (PMH, PSH, Social History, Family History, Medications, and allergies) , and have been updated if relevant. I spent 37  minutes reviewing chart and  face to face time with patient.    Follow-up: No follow-ups on file.    Loraine Grip Mayers, PA-C

## 2020-07-30 NOTE — Patient Instructions (Signed)
I encourage you to take Protonix on a daily basis, you will take this in the morning prior to anything to eat.  I encourage you to consider using Tylenol instead of ibuprofen to see if this helps reduce your stomach pain as well.  I encourage you to use my fitness pal to track your caloric intake, increase your water intake, and make sure you are eating plenty of fiber.  If you are still having issues with constipation, you can try adding MiraLAX to your daily regimen.  I sent a prescription cream to your pharmacy for the spot on your scalp.  We will call you with your lab result.  Please let us know if there is anything else we can do for you.  Kennieth Rad, PA-C Physician Assistant [facility] [email or web address]    Constipation, Adult Constipation is when a person has fewer than three bowel movements in a week, has difficulty having a bowel movement, or has stools (feces) that are dry, hard, or larger than normal. Constipation may be caused by an underlying condition. It may become worse with age if a person takes certain medicines and does not take in enough fluids. Follow these instructions at home: Eating and drinking  Eat foods that have a lot of fiber, such as beans, whole grains, and fresh fruits and vegetables.  Limit foods that are low in fiber and high in fat and processed sugars, such as fried or sweet foods. These include french fries, hamburgers, cookies, candies, and soda.  Drink enough fluid to keep your urine pale yellow.   General instructions  Exercise regularly or as told by your health care provider. Try to do 150 minutes of moderate exercise each week.  Use the bathroom when you have the urge to go. Do not hold it in.  Take over-the-counter and prescription medicines only as told by your health care provider. This includes any fiber supplements.  During bowel movements: ? Practice deep breathing  while relaxing the lower abdomen. ? Practice pelvic floor relaxation.  Watch your condition for any changes. Let your health care provider know about them.  Keep all follow-up visits as told by your health care provider. This is important. Contact a health care provider if:  You have pain that gets worse.  You have a fever.  You do not have a bowel movement after 4 days.  You vomit.  You are not hungry or you lose weight.  You are bleeding from the opening between the buttocks (anus).  You have thin, pencil-like stools. Get help right away if:  You have a fever and your symptoms suddenly get worse.  You leak stool or have blood in your stool.  Your abdomen is bloated.  You have severe pain in your abdomen.  You feel dizzy or you faint. Summary  Constipation is when a person has fewer than three bowel movements in a week, has difficulty having a bowel movement, or has stools (feces) that are dry, hard, or larger than normal.  Eat foods that have a lot of fiber, such as beans, whole grains, and fresh fruits and vegetables.  Drink enough fluid to keep your urine pale yellow.  Take over-the-counter and prescription medicines only as told by your health care provider. This includes any fiber supplements. This information is not intended to replace advice given to you by your health care provider. Make sure you discuss any questions you have with your health care provider. Document Revised: 01/19/2019 Document Reviewed:  01/19/2019 Elsevier Patient Education  2021 [employer].

## 2020-07-31 DIAGNOSIS — R1084 Generalized abdominal pain: Secondary | ICD-10-CM

## 2020-07-31 DIAGNOSIS — L309 Dermatitis, unspecified: Secondary | ICD-10-CM | POA: Insufficient documentation

## 2020-07-31 HISTORY — DX: Generalized abdominal pain: R10.84

## 2020-07-31 HISTORY — DX: Dermatitis, unspecified: L30.9

## 2020-08-01 ENCOUNTER — Encounter: Payer: Self-pay | Admitting: Physician Assistant

## 2020-08-01 LAB — H PYLORI, IGM, IGG, IGA AB
H pylori, IgM Abs: <9 units (ref 0.0–8.9)
H. pylori, IgA Abs: 16.6 units — ABNORMAL HIGH (ref 0.0–8.9)
H. pylori, IgG AbS: 0.3 Index Value (ref 0.00–0.79)

## 2020-08-01 MED ORDER — CLARITHROMYCIN 500 MG PO TABS: 500.0000 mg | ORAL_TABLET | Freq: Two times a day (BID) | ORAL | 0 refills | Status: AC

## 2020-08-01 MED ORDER — AMOXICILLIN 500 MG PO CAPS: 1000.0000 mg | ORAL_CAPSULE | Freq: Two times a day (BID) | ORAL | 0 refills | Status: AC

## 2020-08-01 NOTE — Addendum Note (Signed)
Addended by: Kennieth Rad on: 08/01/2020 01:50 PM   Modules accepted: Orders

## 2020-08-01 NOTE — Progress Notes (Signed)
MA LVM for patient informing him of the treatment for the positive h pylori.

## 2020-11-07 ENCOUNTER — Encounter: Payer: Self-pay | Admitting: Gastroenterology

## 2020-12-01 ENCOUNTER — Other Ambulatory Visit: Payer: Self-pay

## 2020-12-01 ENCOUNTER — Ambulatory Visit (HOSPITAL_COMMUNITY)
Admission: EM | Admit: 2020-12-01 | Discharge: 2020-12-01 | Disposition: A | Discharge status: Home / Self Care | Payer: 59 | Attending: Urgent Care | Admitting: Urgent Care

## 2020-12-01 ENCOUNTER — Encounter (HOSPITAL_COMMUNITY): Payer: Self-pay

## 2020-12-01 DIAGNOSIS — R112 Nausea with vomiting, unspecified: Secondary | ICD-10-CM | POA: Diagnosis not present

## 2020-12-01 DIAGNOSIS — R140 Abdominal distension (gaseous): Secondary | ICD-10-CM

## 2020-12-01 DIAGNOSIS — Z8619 Personal history of other infectious and parasitic diseases: Secondary | ICD-10-CM | POA: Diagnosis not present

## 2020-12-01 MED ORDER — ONDANSETRON 8 MG PO TBDP
8.0000 mg | ORAL_TABLET | Freq: Three times a day (TID) | ORAL | 0 refills | Status: DC | PRN
Start: 2020-12-01 — End: 2020-12-04

## 2020-12-01 MED ORDER — ALUMINUM-MAGNESIUM-SIMETHICONE 200-200-20 MG/5ML PO SUSP: 30.0000 mL | Freq: Three times a day (TID) | ORAL | 0 refills | Status: DC

## 2020-12-01 NOTE — ED Triage Notes (Signed)
Pt presents with ongoing nausea, vomiting, bloating & abdominal discomfort X 2 weeks; pt was recently treated for abdominal infection and got tempoarary relief with antibiotics and other prescribed medication.

## 2020-12-01 NOTE — Discharge Instructions (Signed)
Stop oxycodone. You may take '500mg'$ -'650mg'$  Tylenol with ibuprofen 400-'600mg'$  every 6 hours for aches, pains, fevers and general inflammation. Start Zofran and Maalox to help with your GI symptoms. Keep your appointment with you GI doctor in 3 days. If you develop severe abdominal pain, bloating, bloody stools, fever, stop being able to poop then report to the ER.

## 2020-12-01 NOTE — ED Provider Notes (Signed)
[facility]   MRN: [id number] DOB: 1975/11/18  Subjective:   Zachary Harper is a 45 y.o. male presenting for 2-week history of persistent nausea, vomiting, abdominal bloating.  Patient had an abdominal surgery for repair of the hernia on 11/23/2020.  He has been using oxycodone for pain since then.  Reports that he had a difficult time defecating initially but has since improved.  He is able to pass gas.  However his stomach feels really uncomfortable and feels like it did before when he was diagnosed with H. pylori.  This was back in May, states that it was done through blood test.  He underwent a course of amoxicillin only.  States that he would like to be considered for treatment for this again.  Denies fever, chest pain, shortness of breath, bloody stools.  No history of GI issues.  He does have an appointment for a GI evaluation in 3 days.  No current facility-administered medications for this encounter.  Current Outpatient Medications:    pantoprazole (PROTONIX) 40 MG tablet, Take 1 tablet (40 mg total) by mouth daily., Disp: 30 tablet, Rfl: 3   triamcinolone ointment (KENALOG) 0.5 %, Apply 1 application topically 2 (two) times daily., Disp: 30 g, Rfl: 0   No Known Allergies  Past Medical History:  Diagnosis Date   Hypertension    Pancreatitis      Past Surgical History:  Procedure Laterality Date   TYMPANOSTOMY TUBE PLACEMENT Bilateral     Family History  Problem Relation Age of Onset   Hypertension Father    Diabetes Father    Hypertension Paternal Grandmother    Other Sister        Leg Issues   Depression Sister    Asthma Son    Asthma Daughter     Social History   Tobacco Use   Smoking status: Every Day    Packs/day: 1.00    Years: 6.00    Pack years: 6.00    Types: Cigarettes, Cigars    Start date: 05/31/2012   Smokeless tobacco: Never  Vaping Use   Vaping Use: Never used  Substance Use Topics   Alcohol use: Yes    Alcohol/week: 12.0 -  24.0 standard drinks    Types: 12 - 24 Shots of liquor per week    Comment: daily; (08/22/11-reports drinking a fifth in last week, had not drank since getting out of hospital in april   Drug use: Yes    Types: Marijuana    ROS   Objective:   Vitals: BP (!) 133/97 (BP Location: Right Arm)   Pulse 85   Temp 98.5 F (36.9 C) (Oral)   Resp 18   SpO2 94%   Physical Exam Constitutional:      General: He is not in acute distress.    Appearance: Normal appearance. He is well-developed. He is not ill-appearing, toxic-appearing or diaphoretic.  HENT:     Head: Normocephalic and atraumatic.     Right Ear: External ear normal.     Left Ear: External ear normal.     Nose: Nose normal.     Mouth/Throat:     Mouth: Mucous membranes are moist.     Pharynx: Oropharynx is clear.  Eyes:     General: No scleral icterus.    Extraocular Movements: Extraocular movements intact.     Pupils: Pupils are equal, round, and reactive to light.  Cardiovascular:     Rate and Rhythm: Normal rate and regular rhythm.  Heart sounds: Normal heart sounds. No murmur heard.   No friction rub. No gallop.  Pulmonary:     Effort: Pulmonary effort is normal. No respiratory distress.     Breath sounds: Normal breath sounds. No stridor. No wheezing, rhonchi or rales.  Abdominal:     General: Bowel sounds are normal. There is no distension.     Palpations: Abdomen is soft. There is no mass.     Tenderness: There is abdominal tenderness (mild over left side of abdomen). There is no right CVA tenderness, left CVA tenderness, guarding or rebound.     Hernia: No hernia is present.     Comments: Incision sites are well approximated, no tenderness, drainage of pus or surrounding induration erythema.  Skin:    General: Skin is warm and dry.  Neurological:     Mental Status: He is alert and oriented to person, place, and time.  Psychiatric:        Mood and Affect: Mood normal.        Behavior: Behavior normal.         Thought Content: Thought content normal.     Assessment and Plan :   PDMP not reviewed this encounter.  1. Nausea and vomiting, intractability of vomiting not specified, unspecified vomiting type   2. Abdominal bloating   3. History of Helicobacter pylori infection     Discussed with patient that I am not able to confirm if he has eradicated H. pylori infection.  I do not see any signs of an ileus, abdominal obstruction, surgical complication, acute abdomen.  Recommended conservative management with Tylenol and ibuprofen as this has been recommended by his surgery's office.  Emphasized discontinuing the opioid pain medication.  He does have an appointment with his GI doctor coming up soon and therefore does not require referral.  In the meantime use Zofran as needed, Maalox for supportive care. Counseled patient on potential for adverse effects with medications prescribed/recommended today, ER and return-to-clinic precautions discussed, patient verbalized understanding.    Jaynee Eagles, PA-C 12/01/20 1434

## 2020-12-02 ENCOUNTER — Encounter (HOSPITAL_COMMUNITY): Payer: Self-pay | Admitting: Emergency Medicine

## 2020-12-02 ENCOUNTER — Emergency Department (HOSPITAL_COMMUNITY)
Admission: EM | Admit: 2020-12-02 | Discharge: 2020-12-02 | Disposition: A | Discharge status: Home / Self Care | Payer: 59 | Attending: Emergency Medicine | Admitting: Emergency Medicine

## 2020-12-02 ENCOUNTER — Other Ambulatory Visit: Payer: Self-pay

## 2020-12-02 DIAGNOSIS — F17210 Nicotine dependence, cigarettes, uncomplicated: Secondary | ICD-10-CM | POA: Insufficient documentation

## 2020-12-02 DIAGNOSIS — K279 Peptic ulcer, site unspecified, unspecified as acute or chronic, without hemorrhage or perforation: Secondary | ICD-10-CM | POA: Diagnosis not present

## 2020-12-02 DIAGNOSIS — R1012 Left upper quadrant pain: Secondary | ICD-10-CM | POA: Diagnosis present

## 2020-12-02 DIAGNOSIS — I10 Essential (primary) hypertension: Secondary | ICD-10-CM | POA: Diagnosis not present

## 2020-12-02 DIAGNOSIS — R1084 Generalized abdominal pain: Secondary | ICD-10-CM

## 2020-12-02 LAB — CBC WITH DIFFERENTIAL/PLATELET
Abs Immature Granulocytes: 0.02 10*3/uL (ref 0.00–0.07)
Basophils Absolute: 0.1 10*3/uL (ref 0.0–0.1)
Basophils Relative: 1 %
Eosinophils Absolute: 0.2 10*3/uL (ref 0.0–0.5)
Eosinophils Relative: 3 %
HCT: 43 % (ref 39.0–52.0)
Hemoglobin: 15 g/dL (ref 13.0–17.0)
Immature Granulocytes: 0 %
Lymphocytes Relative: 32 %
Lymphs Abs: 2.4 10*3/uL (ref 0.7–4.0)
MCH: 33.6 pg (ref 26.0–34.0)
MCHC: 34.9 g/dL (ref 30.0–36.0)
MCV: 96.4 fL (ref 80.0–100.0)
Monocytes Absolute: 0.5 10*3/uL (ref 0.1–1.0)
Monocytes Relative: 7 %
Neutro Abs: 4.4 10*3/uL (ref 1.7–7.7)
Neutrophils Relative %: 57 %
Platelets: 520 10*3/uL — ABNORMAL HIGH (ref 150–400)
RBC: 4.46 MIL/uL (ref 4.22–5.81)
RDW: 13.4 % (ref 11.5–15.5)
WBC: 7.7 10*3/uL (ref 4.0–10.5)
nRBC: 0 % (ref 0.0–0.2)

## 2020-12-02 LAB — COMPREHENSIVE METABOLIC PANEL
ALT: 12 U/L (ref 0–44)
AST: 14 U/L — ABNORMAL LOW (ref 15–41)
Albumin: 3.5 g/dL (ref 3.5–5.0)
Alkaline Phosphatase: 57 U/L (ref 38–126)
Anion gap: 8 (ref 5–15)
BUN: 22 mg/dL — ABNORMAL HIGH (ref 6–20)
CO2: 26 mmol/L (ref 22–32)
Calcium: 9.4 mg/dL (ref 8.9–10.3)
Chloride: 105 mmol/L (ref 98–111)
Creatinine, Ser: 1.08 mg/dL (ref 0.61–1.24)
GFR, Estimated: >60 mL/min (ref >60)
Glucose, Bld: 115 mg/dL — ABNORMAL HIGH (ref 70–99)
Potassium: 3.7 mmol/L (ref 3.5–5.1)
Sodium: 139 mmol/L (ref 135–145)
Total Bilirubin: 0.4 mg/dL (ref 0.3–1.2)
Total Protein: 6.5 g/dL (ref 6.5–8.1)

## 2020-12-02 LAB — URINALYSIS, ROUTINE W REFLEX MICROSCOPIC
Bilirubin Urine: NEGATIVE
Glucose, UA: NEGATIVE mg/dL
Hgb urine dipstick: NEGATIVE
Ketones, ur: 15 mg/dL — AB
Leukocytes,Ua: NEGATIVE
Nitrite: NEGATIVE
Protein, ur: NEGATIVE mg/dL
Specific Gravity, Urine: >1.03 — ABNORMAL HIGH (ref 1.005–1.030)
pH: 5.5 (ref 5.0–8.0)

## 2020-12-02 LAB — LIPASE, BLOOD: Lipase: 72 U/L — ABNORMAL HIGH (ref 11–51)

## 2020-12-02 MED ORDER — ALUM & MAG HYDROXIDE-SIMETH 200-200-20 MG/5ML PO SUSP
30.0000 mL | Freq: Once | ORAL | Status: AC
  Administered 2020-12-02: 30 mL via ORAL
  Filled 2020-12-02: qty 30

## 2020-12-02 MED ORDER — LIDOCAINE VISCOUS HCL 2 % MT SOLN
15.0000 mL | Freq: Once | OROMUCOSAL | Status: AC
  Administered 2020-12-02: 15 mL via ORAL
  Filled 2020-12-02: qty 15

## 2020-12-02 MED ORDER — ACETAMINOPHEN 325 MG PO TABS
650.0000 mg | ORAL_TABLET | Freq: Once | ORAL | Status: AC
  Administered 2020-12-02: 650 mg via ORAL
  Filled 2020-12-02: qty 2

## 2020-12-02 MED ORDER — PROMETHAZINE HCL 25 MG RE SUPP: 25.0000 mg | Freq: Four times a day (QID) | RECTAL | 0 refills | Status: DC | PRN

## 2020-12-02 MED ORDER — KETOROLAC TROMETHAMINE 30 MG/ML IJ SOLN
60.0000 mg | Freq: Once | INTRAMUSCULAR | Status: AC
  Administered 2020-12-02: 60 mg via INTRAMUSCULAR
  Filled 2020-12-02: qty 2

## 2020-12-02 NOTE — ED Provider Notes (Signed)
[facility] EMERGENCY DEPARTMENT Provider Note   CSN: [id number] Arrival date & time: 12/02/20  [id number]     History Chief Complaint  Patient presents with   Abdominal Pain    Zachary Harper is a 45 y.o. male.  HPI     Pt is a 45 y/o M comes in with cc of n/v/abd pain. Abd pain has been present for 2-3 weeks, it is constant, and located over the LUQ and epigastric region, radiating to the back. Pain is constant and there is worsenig of the pain with po intake.  Pt is constipated, last BM was y'day and it was dark.  Pt has been nauseated, but no emesis in the last 24 hours.  Seen at the urgent care yesterday for same complaint.  Pt has hx of pancreatitis, alcoholism and R inguinal hernia repair in September. Also has hx of H. Pylori.  Reports that the pain is similar to his H pylori -and the abdominal fullness particularly is similar to his gastritis.  Past Medical History:  Diagnosis Date   Hypertension    Pancreatitis     Patient Active Problem List   Diagnosis Date Noted   Generalized abdominal pain 07/31/2020   Dermatitis 07/31/2020   Right groin mass 05/23/2020   Enlarged lymph nodes 05/23/2020   Loss of weight 05/23/2020   History of alcoholism ([place]) 06/01/2018   History of pancreatitis 06/01/2018   HTN (hypertension) 07/05/2011    Past Surgical History:  Procedure Laterality Date   TYMPANOSTOMY TUBE PLACEMENT Bilateral        Family History  Problem Relation Age of Onset   Hypertension Father    Diabetes Father    Hypertension Paternal Grandmother    Other Sister        Leg Issues   Depression Sister    Asthma Son    Asthma Daughter     Social History   Tobacco Use   Smoking status: Every Day    Packs/day: 1.00    Years: 6.00    Pack years: 6.00    Types: Cigarettes, Cigars    Start date: 05/31/2012   Smokeless tobacco: Never  Vaping Use   Vaping Use: Never used  Substance Use Topics   Alcohol use: Yes    Alcohol/week:  12.0 - 24.0 standard drinks    Types: 12 - 24 Shots of liquor per week    Comment: daily; (08/22/11-reports drinking a fifth in last week, had not drank since getting out of hospital in april   Drug use: Yes    Types: Marijuana    Home Medications Prior to Admission medications   Medication Sig Start Date End Date Taking? Authorizing Provider  promethazine (PHENERGAN) 25 MG suppository Place 1 suppository (25 mg total) rectally every 6 (six) hours as needed for nausea or vomiting. 12/02/20  Yes Varney Biles, MD  aluminum-magnesium hydroxide-simethicone (MAALOX) [id number] MG/5ML SUSP Take 30 mLs by mouth 4 (four) times daily -  before meals and at bedtime. 12/01/20   Jaynee Eagles, PA-C  ondansetron (ZOFRAN-ODT) 8 MG disintegrating tablet Take 1 tablet (8 mg total) by mouth every 8 (eight) hours as needed for nausea or vomiting. 12/01/20   Jaynee Eagles, PA-C  pantoprazole (PROTONIX) 40 MG tablet Take 1 tablet (40 mg total) by mouth daily. 07/30/20   Mayers, Cari S, PA-C  triamcinolone ointment (KENALOG) 0.5 % Apply 1 application topically 2 (two) times daily. 07/30/20   Mayers, Loraine Grip, PA-C    Allergies  Patient has no known allergies.  Review of Systems   Review of Systems  Constitutional:  Positive for activity change.  Gastrointestinal:  Positive for abdominal pain, constipation, nausea and vomiting.  Genitourinary:  Negative for dysuria.  Allergic/Immunologic: Negative for immunocompromised state.  Hematological:  Does not bruise/bleed easily.  All other systems reviewed and are negative.  Physical Exam Updated Vital Signs BP (!) 115/103 (BP Location: Right Arm)   Pulse 66   Temp 97.7 F (36.5 C) (Oral)   Resp 17   SpO2 94%   Physical Exam Vitals and nursing note reviewed.  Constitutional:      Appearance: He is well-developed.  HENT:     Head: Atraumatic.  Cardiovascular:     Rate and Rhythm: Normal rate.  Pulmonary:     Effort: Pulmonary effort is normal.  Abdominal:      Palpations: Abdomen is soft.     Tenderness: There is generalized abdominal tenderness and tenderness in the epigastric area and left upper quadrant. There is no rebound.  Musculoskeletal:     Cervical back: Neck supple.  Skin:    General: Skin is warm.  Neurological:     Mental Status: He is alert and oriented to person, place, and time.    ED Results / Procedures / Treatments   Labs (all labs ordered are listed, but only abnormal results are displayed) Labs Reviewed  CBC WITH DIFFERENTIAL/PLATELET - Abnormal; Notable for the following components:      Result Value   Platelets 520 (*)    All other components within normal limits  COMPREHENSIVE METABOLIC PANEL - Abnormal; Notable for the following components:   Glucose, Bld 115 (*)    BUN 22 (*)    AST 14 (*)    All other components within normal limits  LIPASE, BLOOD - Abnormal; Notable for the following components:   Lipase 72 (*)    All other components within normal limits  URINALYSIS, ROUTINE W REFLEX MICROSCOPIC - Abnormal; Notable for the following components:   Specific Gravity, Urine >1.030 (*)    Ketones, ur 15 (*)    All other components within normal limits    EKG EKG Interpretation  Date/Time:  Sunday December 02 2020 09:23:57 EDT Ventricular Rate:  64 PR Interval:  146 QRS Duration: 78 QT Interval:  408 QTC Calculation: 421 R Axis:   59 Text Interpretation: Sinus rhythm Probable left atrial enlargement No acute changes No significant change since last tracing Confirmed by Varney Biles [phone]) on 12/02/2020 9:57:56 AM  Radiology No results found.  Procedures Procedures   Medications Ordered in ED Medications  ketorolac (TORADOL) 30 MG/ML injection 60 mg (60 mg Intramuscular Given 12/02/20 1026)  alum & mag hydroxide-simeth (MAALOX/MYLANTA) 200-200-20 MG/5ML suspension 30 mL (30 mLs Oral Given 12/02/20 1026)    And  lidocaine (XYLOCAINE) 2 % viscous mouth solution 15 mL (15 mLs Oral Given  12/02/20 1026)  acetaminophen (TYLENOL) tablet 650 mg (650 mg Oral Given 12/02/20 1024)    ED Course  I have reviewed the triage vital signs and the nursing notes.  Pertinent labs & imaging results that were available during my care of the patient were reviewed by me and considered in my medical decision making (see chart for details).    MDM Rules/Calculators/A&P                            DDx includes: Pancreatitis Hepatobiliary pathology including cholecystitis Gastritis/PUD SBO  ACS syndrome Aortic Dissection Pyelonephritis Renal stones  45 year old male comes in with chief complaint of abdominal pain.  He has history of H. pylori gastritis, chronic pancreatitis and recently had inguinal hernia repair.  He has been having nausea, vomiting, abdominal pain for the last several days.  He is also having some bloating type feeling and constipation.  He is passing gas and his last BM was today.  Although the BM was dark, there is no history of upper GI bleed and patient has stable and normal hemoglobin with BUN that is also reasonable. I doubt that he is having upper GI bleed.  Abdominal pain is in the upper quadrants, worse on the left side.  No rebound or guarding, abdomen is still soft.  Patient reports that the pain is similar to his prior PUD/H pylori infection.  Pain is radiating to the back, which makes gastritis little higher in the differential.  No splenomegaly.  His labs are reassuring.  At this time, I feel comfortable with conservative management, especially in the setting of the symptoms ongoing for more than 2 weeks.  SBO considered but less likely given that he is not only passing flatus but also having not diarrhea like BM. ? Ileus  at this time.  Patient has a GI follow-up coming up.  He has passed oral challenge in the ER.  Strict ER return precautions have been discussed, he will return to the ER if he starts having worsening symptoms.    Final Clinical  Impression(s) / ED Diagnoses Final diagnoses:  PUD (peptic ulcer disease)  Generalized abdominal pain    Rx / DC Orders ED Discharge Orders          Ordered    promethazine (PHENERGAN) 25 MG suppository  Every 6 hours PRN        12/02/20 1114             Varney Biles, MD 12/02/20 1123

## 2020-12-02 NOTE — ED Triage Notes (Signed)
Pt c/o abdominal pain with nausea and vomiting that started yesterday. Seen at [facility] for same, per pt, hernia repair last week.

## 2020-12-02 NOTE — ED Provider Notes (Signed)
Emergency Medicine Provider Triage Evaluation Note  Zachary Harper , a 45 y.o. male  was evaluated in triage.  Pt complains of abdominal pain.  The patient reports a 2-week history of nausea, vomiting, abdominal pain and bloating.  The patient had an inguinal hernia repair on September 9.  He has been taking oxycodone for pain since that time.  Last episode of vomiting was earlier today.  He has not been having pain over the surgical site.  Reports that he is having difficulty having a bowel movement, but last BM was earlier today.  He states that he feels dehydrated, but reports that he has been voiding frequently.  Denies fever, chills, chest pain, shortness of breath, hematuria, dysuria, diarrhea.  He has a history of pancreatitis.  Reports a 3 to 4-week period of alcohol cessation.  He has an upcoming evaluation with GI in 3 days.  Review of Systems  Positive: Abdominal pain, bloating, constipation, nausea, vomiting Negative: Fever, chills, chest pain, shortness of breath, hematuria, dysuria, diarrhea  Physical Exam  BP (!) 153/100 (BP Location: Right Arm)   Pulse 82   Temp 97.6 F (36.4 C) (Oral)   Resp 18   SpO2 95%  Gen:   Awake, no distress   Resp:  Normal effort  MSK:   Moves extremities without difficulty  Other:  Thin male.  Umbilical hernia that is able to be reduced.  Abdomen is soft and nondistended.  No focal tenderness palpation to the abdomen.  Medical Decision Making  Medically screening exam initiated at 2:48 AM.  Appropriate orders placed.  Asad Vanduyne was informed that the remainder of the evaluation will be completed by another provider, this initial triage assessment does not replace that evaluation, and the importance of remaining in the ED until their evaluation is complete.  Labs have been ordered.  Will defer imaging at this time as he has no peritoneal signs.  He will require further work-up and evaluation in the emergency department.   Joanne Gavel,  PA-C 12/02/20 0251    Quintella Reichert, MD 12/02/20 [phone]

## 2020-12-02 NOTE — Discharge Instructions (Signed)
We saw you in the ER for the abdominal pain. All of our results are normal, including all labs and imaging. Kidney function is fine as well. We suspect that your pain is because of peptic ulcer disease or chronic pancreatitis, and recommend that you see GI doctor as planned .  Ensure that you are drinking plenty of water and doing everything possible to reduce constipation.  Narcotic pain medication should only be used for severe pain.  Otherwise take Tylenol fine at milligrams every 6 hours.   if your symptoms get worse, return to the ER. Take the pain meds and nausea meds as prescribed.

## 2020-12-04 ENCOUNTER — Ambulatory Visit (INDEPENDENT_AMBULATORY_CARE_PROVIDER_SITE_OTHER): Payer: 59 | Admitting: Gastroenterology

## 2020-12-04 ENCOUNTER — Encounter: Payer: Self-pay | Admitting: Gastroenterology

## 2020-12-04 VITALS — BP 102/70 | HR 112 | Ht 70.5 in | Wt 101.0 lb

## 2020-12-04 DIAGNOSIS — R1084 Generalized abdominal pain: Secondary | ICD-10-CM | POA: Diagnosis not present

## 2020-12-04 DIAGNOSIS — K5900 Constipation, unspecified: Secondary | ICD-10-CM | POA: Diagnosis not present

## 2020-12-04 DIAGNOSIS — R634 Abnormal weight loss: Secondary | ICD-10-CM | POA: Diagnosis not present

## 2020-12-04 MED ORDER — PANTOPRAZOLE SODIUM 40 MG PO TBEC: 40.0000 mg | DELAYED_RELEASE_TABLET | Freq: Every day | ORAL | 1 refills | Status: DC

## 2020-12-04 MED ORDER — PLENVU 140 G PO SOLR: ORAL | 0 refills | Status: DC

## 2020-12-04 MED ORDER — SENNA 8.6 MG PO TABS: 1.0000 | ORAL_TABLET | Freq: Every day | ORAL | 0 refills | Status: DC | PRN

## 2020-12-04 NOTE — Patient Instructions (Addendum)
If you are age 46 or older, your body mass index should be between 23-30. Your Body mass index is 14.29 kg/m. If this is out of the aforementioned range listed, please consider follow up with your Primary Care Provider.  If you are age 46 or younger, your body mass index should be between 19-25. Your Body mass index is 14.29 kg/m. If this is out of the aformentioned range listed, please consider follow up with your Primary Care Provider.   We have sent the following medications to your pharmacy for you to pick up at your convenience: Protonix a 40 mg Senna 8.6 mg as needed if you do not have a bowel movement in 2 days.  Please purchase Metamucil over the counter. Take as directed.   You have been scheduled for an endoscopy and colonoscopy. Please follow the written instructions given to you at your visit today. Please pick up your prep supplies at the pharmacy within the next 1-3 days. If you use inhalers (even only as needed), please bring them with you on the day of your procedure.   You have been scheduled for a CT scan of the Chest at [facility] ([address]---this is in the same building as [employer]).   You are scheduled on 12/07/20 at 2:15pm. You should arrive 15 minutes prior to your appointment time for registration. Please follow the written instructions below on the day of your exam:  Do not have anything to eat or drink 2 hours prior to your exam.   WARNING: IF YOU ARE ALLERGIC TO IODINE/X-RAY DYE, PLEASE NOTIFY RADIOLOGY IMMEDIATELY AT [id number]! YOU WILL BE GIVEN A 13 HOUR PREMEDICATION PREP.  You may take any medications as prescribed with a small amount of water, if necessary. If you take any of the following medications: METFORMIN, GLUCOPHAGE, GLUCOVANCE, AVANDAMET, RIOMET, FORTAMET, [place] MET, JANUMET, GLUMETZA or METAGLIP, you MAY be asked to HOLD this medication 48 hours AFTER the exam.  The purpose of you drinking the oral contrast is  to aid in the visualization of your intestinal tract. The contrast solution may cause some diarrhea. Depending on your individual set of symptoms, you may also receive an intravenous injection of x-ray contrast/dye. Plan on being at [facility] for 30 minutes or longer, depending on the type of exam you are having performed.  This test typically takes 30-45 minutes to complete.  If you have any questions regarding your exam or if you need to reschedule, you may call the CT department at [phone] between the hours of 8:00 am and 5:00 pm, Monday-Friday.  The [facility] providers would like to encourage you to use [facility] to communicate with providers for non-urgent requests or questions.  Due to long hold times on the telephone, sending your provider a message by [facility] may be a faster and more efficient way to get a response.  Please allow 48 business hours for a response.  Please remember that this is for non-urgent requests.   It was a pleasure to see you today!  Thank you for trusting me with your gastrointestinal care!    Scott E. Candis Schatz, MD

## 2020-12-04 NOTE — Progress Notes (Signed)
HPI : Zachary Harper is a pleasant 45 year old male with a history of alcohol abuse and chronic pancreatitis who is referred to [place] for further evaluation of abdominal pain and weight loss.  The patient has had longstanding issues with abdominal pain and constipation, but recently the abdominal pain has been much worse and he has lost a significant amount of weight.  A year ago, he weighed 125 pounds.  In May he weighed 115 pounds.  Now, he weighs 100 pounds.  He reports that his recent pain has been on the left side of his abdomen, which he says is similar to his acute pancreatitis pain.  The pain has been like this for several months now and it comes and goes.  It interferes with sleep.  It is sometimes worsened with eating but not always.  He has had episodes of nausea and vomiting associated with the pain.  He reports getting full early meals and not being able to eat as much as he usually does.  He denies dysphagia, but admits due to poor dentition he follows mostly a soft diet.  He reports longstanding constipation manifested by small hard stools which are difficult to pass.  He has straining and is often on the toilet for 15 to 20 minutes each time he has a bowel movement.  Usually has a bowel movement every 1 to 2 days.  He recently had surgery for an inguinal hernia and was prescribed Colace which she says has helped a little bit with making his stools easier to pass.  He has not tried taking any other laxatives for his constipation.  His pain is improved when he is able to have a good bowel movement. He denies any blood in his stool, but does report having some black stools a few weeks ago. He was taking ibuprofen daily to help with his abdominal pain but he stopped taking this a few weeks ago when he was advised that this may be worsening his abdominal pain  He went to the Emergency room on Sept 17th and 18th for these symptoms and was found to have normal CBC, CMP and mildly elevated lipase (72).    In May he was found to have a positive H. Pylori serology.  He was prescribed antibiotics but he isn't sure he took them correctly.  He states that after he took the antibiotics he felt better for a while, but then his symptoms returned. He reports rare alcohol use.  He thinks his last drink was September 2. He reports having a colonoscopy in 2013 to evaluate abdominal pain.  He states that this was normal.   He does not think he is ever had an upper endoscopy.  No fam hx of CRC or GI malignancy.  Past Medical History:  Diagnosis Date   Hypertension    Pancreatitis      Past Surgical History:  Procedure Laterality Date   TYMPANOSTOMY TUBE PLACEMENT Bilateral    Family History  Problem Relation Age of Onset   Hypertension Father    Diabetes Father    Hypertension Paternal Grandmother    Other Sister        Leg Issues   Depression Sister    Asthma Son    Asthma Daughter    Social History   Tobacco Use   Smoking status: Every Day    Packs/day: 1.00    Years: 6.00    Pack years: 6.00    Types: Cigarettes, Cigars  Start date: 05/31/2012   Smokeless tobacco: Never  Vaping Use   Vaping Use: Never used  Substance Use Topics   Alcohol use: Yes    Alcohol/week: 12.0 - 24.0 standard drinks    Types: 12 - 24 Shots of liquor per week    Comment: daily; (08/22/11-reports drinking a fifth in last week, had not drank since getting out of hospital in april   Drug use: Yes    Types: Marijuana  Patien reports rare alcohol use now (<1-2 month)   Current Outpatient Medications  Medication Sig Dispense Refill   aluminum-magnesium hydroxide-simethicone (MAALOX) 591-638-46 MG/5ML SUSP Take 30 mLs by mouth 4 (four) times daily -  before meals and at bedtime. 300 mL 0   ondansetron (ZOFRAN-ODT) 8 MG disintegrating tablet Take 1 tablet (8 mg total) by mouth every 8 (eight) hours as needed for nausea or vomiting. 20 tablet 0   pantoprazole (PROTONIX) 40 MG tablet Take 1 tablet (40 mg  total) by mouth daily. 30 tablet 3   promethazine (PHENERGAN) 25 MG suppository Place 1 suppository (25 mg total) rectally every 6 (six) hours as needed for nausea or vomiting. 12 each 0   triamcinolone ointment (KENALOG) 0.5 % Apply 1 application topically 2 (two) times daily. 30 g 0   No current facility-administered medications for this visit.   No Known Allergies   Review of Systems: All systems reviewed and negative except where noted in HPI.    No results found.  Physical Exam: Ht 5' 10.5" (1.791 m)   Wt 101 lb (45.8 kg)   BMI 14.29 kg/m  Constitutional: Pleasant, very thin African American male in no acute distress. HEENT: Normocephalic and atraumatic. Conjunctivae are normal. No scleral icterus.  Poor dentition, MP1 Cardiovascular: Normal rate, regular rhythm.  Pulmonary/chest: Effort normal and breath sounds normal. No wheezing, rales or rhonchi. Abdominal: Soft, nondistended, mild tenderness to palpation in the epigastrium and right hemiabdomen, no guarding or rigidity.  Bowel sounds active throughout. There are no masses palpable. No hepatomegaly.  Well healed lap port incision sites Extremities: no edema Neurological: Alert and oriented to person place and time. Skin: Skin is warm and dry. No rashes noted. Psychiatric: Normal mood and affect. Behavior is normal.  CBC    Component Value Date/Time   WBC 7.7 12/02/2020 0252   RBC 4.46 12/02/2020 0252   HGB 15.0 12/02/2020 0252   HGB 14.1 05/23/2020 0926   HCT 43.0 12/02/2020 0252   HCT 40.2 05/23/2020 0926   PLT 520 (H) 12/02/2020 0252   PLT 464 (H) 05/23/2020 0926   MCV 96.4 12/02/2020 0252   MCV 91 05/23/2020 0926   MCH 33.6 12/02/2020 0252   MCHC 34.9 12/02/2020 0252   RDW 13.4 12/02/2020 0252   RDW 12.3 05/23/2020 0926   LYMPHSABS 2.4 12/02/2020 0252   LYMPHSABS 2.7 05/23/2020 0926   MONOABS 0.5 12/02/2020 0252   EOSABS 0.2 12/02/2020 0252   EOSABS 0.4 05/23/2020 0926   BASOSABS 0.1 12/02/2020 0252    BASOSABS 0.1 05/23/2020 0926    CMP     Component Value Date/Time   NA 139 12/02/2020 0252   NA 143 05/23/2020 0926   K 3.7 12/02/2020 0252   CL 105 12/02/2020 0252   CO2 26 12/02/2020 0252   GLUCOSE 115 (H) 12/02/2020 0252   BUN 22 (H) 12/02/2020 0252   BUN 24 05/23/2020 0926   CREATININE 1.08 12/02/2020 0252   CREATININE 0.92 06/01/2018 1222   CALCIUM 9.4 12/02/2020 0252  PROT 6.5 12/02/2020 0252   PROT 6.6 05/23/2020 0926   ALBUMIN 3.5 12/02/2020 0252   ALBUMIN 4.1 05/23/2020 0926   AST 14 (L) 12/02/2020 0252   ALT 12 12/02/2020 0252   ALKPHOS 57 12/02/2020 0252   BILITOT 0.4 12/02/2020 0252   BILITOT 0.2 05/23/2020 0926   GFRNONAA >60 12/02/2020 0252   GFRNONAA 102 06/01/2018 1222   GFRAA >60 10/04/2019 2036   GFRAA 118 06/01/2018 1222   Results for HAZIEL, MOLNER (MRN [id number]) as of 12/05/2020 08:13  Ref. Range 07/30/2020 10:57  H. pylori, IgM Abs Latest Ref Range: 0.0 - 8.9 units <9.0  H. pylori, IgA Abs Latest Ref Range: 0.0 - 8.9 units 16.6 (H)  H. pylori, IgG Abs Latest Ref Range: 0.00 - 0.79 Index Value 0.30     *RADIOLOGY REPORT*   Clinical Data: Acute pancreatitis.  Acute alcohol intoxication.  Leukocytosis.  Alcohol withdrawal.  Vomiting.   CT ABDOMEN AND PELVIS WITH CONTRAST   Technique:  Multidetector CT imaging of the abdomen and pelvis was  performed following the standard protocol during bolus  administration of intravenous contrast.   Contrast: 57m OMNIPAQUE IOHEXOL 300 MG/ML  SOLN   Comparison: Plain films of 1 day prior.   Findings: Minimal nodularity right lung base, 3 mm on image 2.  Possible central lucency.  Normal heart size with small left  greater than right bilateral pleural effusions.  No pericardial  effusion.   Small to moderate abdominal pelvic ascites.  Moderate to marked  hepatic steatosis.  Heterogeneous density, primarily within the  right lobe is favored to be related to heterogeneous steatosis.  Example image 8  within the hepatic dome.  Portal and hepatic veins  patent.   Normal spleen, stomach.   Moderate pancreatic edema.  No well-defined areas of pancreatic  necrosis.  There is heterogeneous enhancement throughout the  pancreas.  This is favored to be related to partial fatty  atrophy/fatty replacement.  No pancreatic ductal dilatation.  No  intrahepatic biliary ductal dilatation.  Normal common duct size.   No calcified gallstones.   Normal adrenal glands and kidneys. No retroperitoneal or  retrocrural adenopathy.   Normal colon and terminal ileum.  Appendix not definitely  visualized.  Normal caliber small bowel.   No pelvic adenopathy.  Foley catheter within urinary bladder.  Possible mild prostatomegaly.  Cul-de-sac fluid.  Possible minimal  complexity dependently within, image 76.   Nonspecific heterogeneous marrow density within the pelvis.   IMPRESSION:   1.  Moderate uncomplicated pancreatitis.  Heterogeneous pancreatic  density is favored to be related to fatty atrophy.  No well-defined  areas of hypoenhancement or hypoattenuation to suggest pancreatic  necrosis.  2.  Moderate to abdominal pelvic ascites.  Question minimal  complexity within the cul-de-sac fluid.  This is presumably  secondary to the pancreatic process.  3.  Moderate to marked hepatic steatosis. Areas of heterogeneous  density, primarily within the right lobe, are favored to be related  to heterogeneous steatosis.  4.  Small bilateral pleural effusions.  5.  Tiny right lung base nodule. Given risk factors for  bronchogenic carcinoma, follow-up chest CT at 1 year is  recommended.  This recommendation follows the consensus statement:  "Guidelines for Management of Small Pulmonary Nodules Detected on  CT Scans:  A Statement from the [employer] as published in  Radiology 2005; 237:395-400.  Available online at:  [email or web address]   Original Report  Authenticated By: KAreta Haber M.D.  CLINICAL DATA:  Abdominal pain   EXAM: CT ABDOMEN AND PELVIS WITH CONTRAST   TECHNIQUE: Multidetector CT imaging of the abdomen and pelvis was performed using the standard protocol following bolus administration of intravenous contrast.   CONTRAST:  150m OMNIPAQUE IOHEXOL 300 MG/ML  SOLN   COMPARISON:  July 06, 2011   FINDINGS: Lower chest: The visualized heart size within normal limits. No pericardial fluid/thickening.   No hiatal hernia.   The visualized portions of the lungs are clear.   Hepatobiliary: The liver is normal in density without focal abnormality.The main portal vein is patent. No evidence of calcified gallstones, gallbladder wall thickening or biliary dilatation.   Pancreas: Extensive coarse calcifications are seen throughout the pancreas with mild dilatation of the pancreatic tail duct. No definite surrounding inflammatory changes or loculated fluid collections are noted.   Spleen: Normal in size without focal abnormality.   Adrenals/Urinary Tract: Both adrenal glands appear normal. The kidneys and collecting system appear normal without evidence of urinary tract calculus or hydronephrosis. Bladder is unremarkable.   Stomach/Bowel: The stomach, small bowel, and colon are normal in appearance. No inflammatory changes, wall thickening, or obstructive findings. There is a moderate to large amount of colonic stool without evidence of obstruction.The appendix is normal.   Vascular/Lymphatic: There are no enlarged mesenteric, retroperitoneal, or pelvic lymph nodes. No significant vascular findings are present.   Reproductive: The prostate is unremarkable.   Other: No evidence of abdominal wall mass or hernia.   Musculoskeletal: Again noted are heterogeneous lucent lesions seen throughout the bilateral pelvis, slightly more prominent in number and size than the prior exam.   IMPRESSION: 1. Moderate to large  amount of colonic stool without evidence of obstruction. 2. Findings suggestive of chronic pancreatitis. 3. Interval increase in number and size of lucent lesion seen within the bilateral pelvis. This could be due to monoclonal gammopathy such as multiple myeloma please correlate the patient's laboratory exam     Electronically Signed   By: BPrudencio PairM.D.   On: 10/05/2019 02:32  CLINICAL DATA:  Diffuse abdominal pain radiating into the back. Right groin bulge. Hernia suspected.   EXAM: CT ABDOMEN AND PELVIS WITH CONTRAST   TECHNIQUE: Multidetector CT imaging of the abdomen and pelvis was performed using the standard protocol following bolus administration of intravenous contrast.   CONTRAST:  1017mOMNIPAQUE IOHEXOL 300 MG/ML  SOLN   COMPARISON:  Abdominopelvic CT 09/16/2019 and 06/17/2011.   FINDINGS: Lower chest: Clear lung bases. No significant pleural or pericardial effusion.   Hepatobiliary: The liver is normal in density without suspicious focal abnormality. No evidence of gallstones, gallbladder wall thickening or biliary dilatation.   Pancreas: Multiple parenchymal calcifications throughout the pancreas and chronic ductal dilatation consistent with chronic calcific pancreatitis are again noted. No surrounding fluid collections.   Spleen: Normal in size without focal abnormality.   Adrenals/Urinary Tract: Both adrenal glands appear normal. The kidneys appear normal without evidence of urinary tract calculus, suspicious lesion or hydronephrosis. No bladder abnormalities are seen.   Stomach/Bowel: Enteric contrast was administered and has passed into the distal small bowel. The stomach and proximal small bowel are well opacified with contrast and normal in caliber. The terminal ileum and colon are not opacified with contrast. The appendix and colon appear normal. There is a new low-density mass in the right inguinal region which measures 3.7 x 2.3 cm  transverse on image 67/2. This extends up to 5.3 cm in length on coronal image 14/5. This does not have the  typical appearance of an inguinal hernia containing small bowel, and no clear communication with the peritoneal cavity is identified. In addition, there is no proximal small bowel obstruction.   Vascular/Lymphatic: There are no enlarged abdominal or pelvic lymph nodes. No significant vascular findings.   Reproductive: The prostate gland and seminal vesicles appear unremarkable.   Other: As above, new low-density right inguinal mass which does not contain contrast. No intraperitoneal fluid collections, ascites or free air.   Musculoskeletal: Multiple lucent lesions are again noted within the bony pelvis bilaterally, present on remote CT from 2013, and slightly larger over this 9 year interval. These are presumably benign given relative stability.   IMPRESSION: 1. New indeterminate low-density right inguinal mass, without definite communication with the peritoneal cavity or associated distal small bowel obstruction. The administered enteric contrast has not passed beyond this level into the terminal ileum or colon. This does not have the typical appearance of an inguinal hernia containing small bowel and may reflect a loculated hydrocele or other atypical fluid collection. Consider further evaluation with ultrasound. Clinical follow-up necessary to exclude atypical incarcerated hernia. 2. 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 3. These results will be called to the ordering clinician or representative by the Radiology Department at the imaging location.     Electronically Signed   By: Richardean Sale M.D.   On: 06/20/2020 13:02    *RADIOLOGY REPORT*   Clinical Data: Acute pancreatitis.  Acute alcohol intoxication.  Leukocytosis.  Alcohol withdrawal.  Vomiting.   CT ABDOMEN AND PELVIS WITH CONTRAST   Technique:  Multidetector CT imaging of the  abdomen and pelvis was  performed following the standard protocol during bolus  administration of intravenous contrast.   Contrast: 43m OMNIPAQUE IOHEXOL 300 MG/ML  SOLN   Comparison: Plain films of 1 day prior.   Findings: Minimal nodularity right lung base, 3 mm on image 2.  Possible central lucency.  Normal heart size with small left  greater than right bilateral pleural effusions.  No pericardial  effusion.   Small to moderate abdominal pelvic ascites.  Moderate to marked  hepatic steatosis.  Heterogeneous density, primarily within the  right lobe is favored to be related to heterogeneous steatosis.  Example image 8 within the hepatic dome.  Portal and hepatic veins  patent.   Normal spleen, stomach.   Moderate pancreatic edema.  No well-defined areas of pancreatic  necrosis.  There is heterogeneous enhancement throughout the  pancreas.  This is favored to be related to partial fatty  atrophy/fatty replacement.  No pancreatic ductal dilatation.  No  intrahepatic biliary ductal dilatation.  Normal common duct size.   No calcified gallstones.   Normal adrenal glands and kidneys. No retroperitoneal or  retrocrural adenopathy.   Normal colon and terminal ileum.  Appendix not definitely  visualized.  Normal caliber small bowel.   No pelvic adenopathy.  Foley catheter within urinary bladder.  Possible mild prostatomegaly.  Cul-de-sac fluid.  Possible minimal  complexity dependently within, image 76.   Nonspecific heterogeneous marrow density within the pelvis.   IMPRESSION:   1.  Moderate uncomplicated pancreatitis.  Heterogeneous pancreatic  density is favored to be related to fatty atrophy.  No well-defined  areas of hypoenhancement or hypoattenuation to suggest pancreatic  necrosis.  2.  Moderate to abdominal pelvic ascites.  Question minimal  complexity within the cul-de-sac fluid.  This is presumably  secondary to the pancreatic process.  3.  Moderate to marked  hepatic steatosis. Areas of heterogeneous  density, primarily within the right lobe, are favored to be related  to heterogeneous steatosis.  4.  Small bilateral pleural effusions.  5.  Tiny right lung base nodule. Given risk factors for  bronchogenic carcinoma, follow-up chest CT at 1 year is  recommended.  This recommendation follows the consensus statement:  "Guidelines for Management of Small Pulmonary Nodules Detected on  CT Scans:  A Statement from the [place]" as published in  Radiology 2005; 237:395-400.  Available online at:  [email or web address]   Original Report Authenticated By: Areta Haber, M.D.  CLINICAL DATA:  Abdominal pain   EXAM: CT ABDOMEN AND PELVIS WITH CONTRAST   TECHNIQUE: Multidetector CT imaging of the abdomen and pelvis was performed using the standard protocol following bolus administration of intravenous contrast.   CONTRAST:  142m OMNIPAQUE IOHEXOL 300 MG/ML  SOLN   COMPARISON:  July 06, 2011   FINDINGS: Lower chest: The visualized heart size within normal limits. No pericardial fluid/thickening.   No hiatal hernia.   The visualized portions of the lungs are clear.   Hepatobiliary: The liver is normal in density without focal abnormality.The main portal vein is patent. No evidence of calcified gallstones, gallbladder wall thickening or biliary dilatation.   Pancreas: Extensive coarse calcifications are seen throughout the pancreas with mild dilatation of the pancreatic tail duct. No definite surrounding inflammatory changes or loculated fluid collections are noted.   Spleen: Normal in size without focal abnormality.   Adrenals/Urinary Tract: Both adrenal glands appear normal. The kidneys and collecting system appear normal without evidence of urinary tract calculus or hydronephrosis. Bladder is unremarkable.   Stomach/Bowel: The stomach, small bowel, and colon are normal in appearance.  No inflammatory changes, wall thickening, or obstructive findings. There is a moderate to large amount of colonic stool without evidence of obstruction.The appendix is normal.   Vascular/Lymphatic: There are no enlarged mesenteric, retroperitoneal, or pelvic lymph nodes. No significant vascular findings are present.   Reproductive: The prostate is unremarkable.   Other: No evidence of abdominal wall mass or hernia.   Musculoskeletal: Again noted are heterogeneous lucent lesions seen throughout the bilateral pelvis, slightly more prominent in number and size than the prior exam.   IMPRESSION: 1. Moderate to large amount of colonic stool without evidence of obstruction. 2. Findings suggestive of chronic pancreatitis. 3. Interval increase in number and size of lucent lesion seen within the bilateral pelvis. This could be due to monoclonal gammopathy such as multiple myeloma please correlate the patient's laboratory exam     Electronically Signed   By: BPrudencio PairM.D.   On: 10/05/2019 02:32  CLINICAL DATA:  Diffuse abdominal pain radiating into the back. Right groin bulge. Hernia suspected.   EXAM: CT ABDOMEN AND PELVIS WITH CONTRAST   TECHNIQUE: Multidetector CT imaging of the abdomen and pelvis was performed using the standard protocol following bolus administration of intravenous contrast.   CONTRAST:  1023mOMNIPAQUE IOHEXOL 300 MG/ML  SOLN   COMPARISON:  Abdominopelvic CT 09/16/2019 and 06/17/2011.   FINDINGS: Lower chest: Clear lung bases. No significant pleural or pericardial effusion.   Hepatobiliary: The liver is normal in density without suspicious focal abnormality. No evidence of gallstones, gallbladder wall thickening or biliary dilatation.   Pancreas: Multiple parenchymal calcifications throughout the pancreas and chronic ductal dilatation consistent with chronic calcific pancreatitis are again noted. No surrounding fluid collections.   Spleen:  Normal in size without focal abnormality.   Adrenals/Urinary Tract: Both adrenal glands appear normal. The kidneys appear  normal without evidence of urinary tract calculus, suspicious lesion or hydronephrosis. No bladder abnormalities are seen.   Stomach/Bowel: Enteric contrast was administered and has passed into the distal small bowel. The stomach and proximal small bowel are well opacified with contrast and normal in caliber. The terminal ileum and colon are not opacified with contrast. The appendix and colon appear normal. There is a new low-density mass in the right inguinal region which measures 3.7 x 2.3 cm transverse on image 67/2. This extends up to 5.3 cm in length on coronal image 14/5. This does not have the typical appearance of an inguinal hernia containing small bowel, and no clear communication with the peritoneal cavity is identified. In addition, there is no proximal small bowel obstruction.   Vascular/Lymphatic: There are no enlarged abdominal or pelvic lymph nodes. No significant vascular findings.   Reproductive: The prostate gland and seminal vesicles appear unremarkable.   Other: As above, new low-density right inguinal mass which does not contain contrast. No intraperitoneal fluid collections, ascites or free air.   Musculoskeletal: Multiple lucent lesions are again noted within the bony pelvis bilaterally, present on remote CT from 2013, and slightly larger over this 9 year interval. These are presumably benign given relative stability.   IMPRESSION: 1. New indeterminate low-density right inguinal mass, without definite communication with the peritoneal cavity or associated distal small bowel obstruction. The administered enteric contrast has not passed beyond this level into the terminal ileum or colon. This does not have the typical appearance of an inguinal hernia containing small bowel and may reflect a loculated hydrocele or other atypical fluid  collection. Consider further evaluation with ultrasound. Clinical follow-up necessary to exclude atypical incarcerated hernia. 2. 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 3. These results will be called to the ordering clinician or representative by the Radiology Department at the imaging location.     Electronically Signed   By: Richardean Sale M.D.   On: 06/20/2020 13:02   ASSESSMENT AND PLAN: 45 year old male with history of alcohol abuse, history of acute pancreatitis and subsequent chronic pancreatitis with chronic abdominal pain constipation, now with worsening abdominal pain and 15 pounds of unintentional weight loss in the past 3 months.  His abdominal pain could be secondary to multiple different etiologies such as his chronic pancreatitis, constipation or potentially peptic ulcer disease given his frequent NSAID use.  His weight loss is very concerning and warrants further investigation.  Given the absence of diarrhea/steatorrhea, I do not think his weight loss can easily be attributed to chronic pancreatitis/pancreatic insufficiency.  Similarly, I do not think addition of pancreatic enzyme replacement therapy would be helpful. I recommended we perform an upper and lower endoscopy to assess for GI malignancy as well as peptic ulcer disease, luminal stricture and inflammatory bowel disease.  The patient was noted to have a lung nodule on CT scan in 2013.  Given his smoking history, we should repeat a CT scan of the chest.  He did have a CT scan in April of this year, which did not show any abnormalities to explain his weight loss.   If we do not find any explanations for his weight loss or pain on his endoscopic examinations or chest CT, and I will consider more dedicated imaging of the pancreas (MRI) to exclude pancreatic malignancy. In the meantime, I recommend he treat his constipation with daily Metamucil, and add senna every 2 to 3 days as needed.  I recommended we  started him on Protonix for  potential peptic ulcer disease.  Patient should continue to avoid NSAIDs.  Unintentional weight loss - EGD/colonoscopy - CT chest -Consider MRI pancreas if weight loss continues and no etiology found on endoscopy or chest CT  Constipation -Daily Metamucil -As needed senna  Abdominal pain -Protonix 40 mg p.o. daily -EGD to evaluate for peptic ulcer disease/H. Pylori  I spent a total of 45 minutes reviewing the patient's medical record, interviewing and examining the patient, discussing her diagnosis and management of her condition going forward, and documenting in the medical record  Katlen Seyer E. Candis Schatz, MD [facility]       Steele Sizer, MD

## 2020-12-07 ENCOUNTER — Other Ambulatory Visit: Payer: Self-pay

## 2020-12-07 ENCOUNTER — Ambulatory Visit (INDEPENDENT_AMBULATORY_CARE_PROVIDER_SITE_OTHER)
Admission: RE | Admit: 2020-12-07 | Discharge: 2020-12-07 | Disposition: A | Discharge status: Home / Self Care | Payer: 59 | Source: Ambulatory Visit | Attending: Gastroenterology | Admitting: Gastroenterology

## 2020-12-07 DIAGNOSIS — K5900 Constipation, unspecified: Secondary | ICD-10-CM

## 2020-12-07 DIAGNOSIS — R1084 Generalized abdominal pain: Secondary | ICD-10-CM | POA: Diagnosis not present

## 2020-12-07 DIAGNOSIS — R634 Abnormal weight loss: Secondary | ICD-10-CM

## 2020-12-07 MED ORDER — IOHEXOL 350 MG/ML SOLN
65.0000 mL | Freq: Once | INTRAVENOUS | Status: AC | PRN
  Administered 2020-12-07: 65 mL via INTRAVENOUS

## 2020-12-15 NOTE — Progress Notes (Signed)
Kaelem, there were no concerning findings on your CT chest.  We will see you for your scheduled endoscopies and follow up after that.

## 2021-01-20 ENCOUNTER — Telehealth: Payer: Self-pay | Admitting: Gastroenterology

## 2021-01-20 NOTE — Telephone Encounter (Signed)
Pt called   Had flu like symptoms. Has not tested for Covid  Cancelled EGD/colon for tomorrow.  To be rescheduled when he is better.  RG

## 2021-01-21 ENCOUNTER — Encounter: Payer: 59 | Admitting: Gastroenterology

## 2021-01-23 ENCOUNTER — Ambulatory Visit: Payer: 59 | Admitting: Physician Assistant

## 2021-01-24 ENCOUNTER — Encounter: Payer: 59 | Admitting: Gastroenterology

## 2021-01-24 ENCOUNTER — Other Ambulatory Visit: Payer: Self-pay | Admitting: Gastroenterology

## 2021-01-24 DIAGNOSIS — R1084 Generalized abdominal pain: Secondary | ICD-10-CM

## 2021-02-13 ENCOUNTER — Encounter: Payer: Self-pay | Admitting: Gastroenterology

## 2021-03-29 ENCOUNTER — Other Ambulatory Visit: Payer: Self-pay

## 2021-03-29 ENCOUNTER — Ambulatory Visit (AMBULATORY_SURGERY_CENTER): Payer: Self-pay

## 2021-03-29 VITALS — Ht 70.5 in | Wt 110.0 lb

## 2021-03-29 DIAGNOSIS — R634 Abnormal weight loss: Secondary | ICD-10-CM

## 2021-03-29 DIAGNOSIS — K5900 Constipation, unspecified: Secondary | ICD-10-CM

## 2021-03-29 NOTE — Progress Notes (Signed)
Denies allergies to eggs or soy products. Denies complication of anesthesia or sedation. Denies use of weight loss medication. Denies use of O2.   Emmi instructions given for colonoscopy.  

## 2021-04-02 ENCOUNTER — Encounter: Payer: Self-pay | Admitting: Gastroenterology

## 2021-04-05 ENCOUNTER — Ambulatory Visit (AMBULATORY_SURGERY_CENTER): Payer: 59 | Admitting: Gastroenterology

## 2021-04-05 ENCOUNTER — Encounter: Payer: Self-pay | Admitting: Gastroenterology

## 2021-04-05 VITALS — BP 133/95 | HR 76 | Temp 98.4°F | Resp 23 | Ht 70.5 in | Wt 110.0 lb

## 2021-04-05 DIAGNOSIS — K2100 Gastro-esophageal reflux disease with esophagitis, without bleeding: Secondary | ICD-10-CM | POA: Diagnosis not present

## 2021-04-05 DIAGNOSIS — R634 Abnormal weight loss: Secondary | ICD-10-CM

## 2021-04-05 DIAGNOSIS — K219 Gastro-esophageal reflux disease without esophagitis: Secondary | ICD-10-CM | POA: Diagnosis not present

## 2021-04-05 DIAGNOSIS — K644 Residual hemorrhoidal skin tags: Secondary | ICD-10-CM | POA: Diagnosis not present

## 2021-04-05 DIAGNOSIS — K6289 Other specified diseases of anus and rectum: Secondary | ICD-10-CM | POA: Diagnosis not present

## 2021-04-05 DIAGNOSIS — K3189 Other diseases of stomach and duodenum: Secondary | ICD-10-CM | POA: Diagnosis not present

## 2021-04-05 DIAGNOSIS — K5900 Constipation, unspecified: Secondary | ICD-10-CM

## 2021-04-05 DIAGNOSIS — R1013 Epigastric pain: Secondary | ICD-10-CM

## 2021-04-05 DIAGNOSIS — R1084 Generalized abdominal pain: Secondary | ICD-10-CM

## 2021-04-05 DIAGNOSIS — D120 Benign neoplasm of cecum: Secondary | ICD-10-CM

## 2021-04-05 MED ORDER — PANTOPRAZOLE SODIUM 40 MG PO TBEC: 40.0000 mg | DELAYED_RELEASE_TABLET | Freq: Every day | ORAL | 3 refills | Status: DC

## 2021-04-05 MED ORDER — SODIUM CHLORIDE 0.9 % IV SOLN: 500.0000 mL | Freq: Once | INTRAVENOUS | Status: DC

## 2021-04-05 NOTE — Progress Notes (Signed)
Called to room to assist during endoscopic procedure.  Patient ID and intended procedure confirmed with present staff. Received instructions for my participation in the procedure from the performing physician.  

## 2021-04-05 NOTE — Patient Instructions (Addendum)
Add a fiber supplement like Benefiber or Metamucil to your daily regimen to help with constipation and straining.   Handouts provided on diverticulosis, polyps and high-fiber diet.   Continue present medications.   Follow up in the office if your symptoms persist.   YOU HAD AN ENDOSCOPIC PROCEDURE TODAY AT [facility]:   Refer to the procedure report that was given to you for any specific questions about what was found during the examination.  If the procedure report does not answer your questions, please call your gastroenterologist to clarify.  If you requested that your care partner not be given the details of your procedure findings, then the procedure report has been included in a sealed envelope for you to review at your convenience later.  YOU SHOULD EXPECT: Some feelings of bloating in the abdomen. Passage of more gas than usual.  Walking can help get rid of the air that was put into your GI tract during the procedure and reduce the bloating. If you had a lower endoscopy (such as a colonoscopy or flexible sigmoidoscopy) you may notice spotting of blood in your stool or on the toilet paper. If you underwent a bowel prep for your procedure, you may not have a normal bowel movement for a few days.  Please Note:  You might notice some irritation and congestion in your nose or some drainage.  This is from the oxygen used during your procedure.  There is no need for concern and it should clear up in a day or so.  SYMPTOMS TO REPORT IMMEDIATELY:  Following lower endoscopy (colonoscopy or flexible sigmoidoscopy):  Excessive amounts of blood in the stool  Significant tenderness or worsening of abdominal pains  Swelling of the abdomen that is new, acute  Fever of 100F or higher  Following upper endoscopy (EGD)  Vomiting of blood or coffee ground material  New chest pain or pain under the shoulder blades  Painful or persistently difficult swallowing  New shortness of  breath  Fever of 100F or higher  Black, tarry-looking stools  For urgent or emergent issues, a gastroenterologist can be reached at any hour by calling [phone]. Do not use MyChart messaging for urgent concerns.    DIET:  We do recommend a small meal at first, but then you may proceed to your regular diet.  Drink plenty of fluids but you should avoid alcoholic beverages for 24 hours.  ACTIVITY:  You should plan to take it easy for the rest of today and you should NOT DRIVE or use heavy machinery until tomorrow (because of the sedation medicines used during the test).    FOLLOW UP: Our staff will call the number listed on your records 48-72 hours following your procedure to check on you and address any questions or concerns that you may have regarding the information given to you following your procedure. If we do not reach you, we will leave a message.  We will attempt to reach you two times.  During this call, we will ask if you have developed any symptoms of COVID 19. If you develop any symptoms (ie: fever, flu-like symptoms, shortness of breath, cough etc.) before then, please call [phone].  If you test positive for Covid 19 in the 2 weeks post procedure, please call and report this information to [place].    If any biopsies were taken you will be contacted by phone or by letter within the next 1-3 weeks.  Please call us at [phone] if  you have not heard about the biopsies in 3 weeks.    SIGNATURES/CONFIDENTIALITY: You and/or your care partner have signed paperwork which will be entered into your electronic medical record.  These signatures attest to the fact that that the information above on your After Visit Summary has been reviewed and is understood.  Full responsibility of the confidentiality of this discharge information lies with you and/or your care-partner.

## 2021-04-05 NOTE — Op Note (Signed)
[facility] Patient Name: Zachary Harper Procedure Date: 04/05/2021 1:49 PM MRN: [id number] Endoscopist: Nicki Reaper E. Candis Schatz , MD Age: 46 Referring MD:  Date of Birth: Aug 23, 1975 Gender: Male Account #: [id number] Procedure:                Colonoscopy Indications:              Epigastric abdominal pain, Weight loss Medicines:                Monitored Anesthesia Care Procedure:                Pre-Anesthesia Assessment:                           - Prior to the procedure, a History and Physical                            was performed, and patient medications and                            allergies were reviewed. The patient's tolerance of                            previous anesthesia was also reviewed. The risks                            and benefits of the procedure and the sedation                            options and risks were discussed with the patient.                            All questions were answered, and informed consent                            was obtained. Prior Anticoagulants: The patient has                            taken no previous anticoagulant or antiplatelet                            agents. ASA Grade Assessment: III - A patient with                            severe systemic disease. After reviewing the risks                            and benefits, the patient was deemed in                            satisfactory condition to undergo the procedure.                           After obtaining informed consent, the colonoscope  was passed under direct vision. Throughout the                            procedure, the patient's blood pressure, pulse, and                            oxygen saturations were monitored continuously. The                            Olympus [id number]-[id number] ([id number]) Colonoscope was                            introduced through the anus and advanced to the the                            terminal  ileum, with identification of the                            appendiceal orifice and IC valve. The colonoscopy                            was performed without difficulty. The patient                            tolerated the procedure well. The quality of the                            bowel preparation was good. The terminal ileum,                            ileocecal valve, appendiceal orifice, and rectum                            were photographed. Scope In: 2:16:59 PM Scope Out: 2:32:55 PM Scope Withdrawal Time: 0 hours 11 minutes 38 seconds  Total Procedure Duration: 0 hours 15 minutes 56 seconds  Findings:                 Skin tags were found on perianal exam.                           A few small-mouthed diverticula were found in the                            ascending colon.                           A 3 mm polyp was found in the cecum. The polyp was                            sessile. The polyp was removed with a cold snare.                            Resection and retrieval were complete. Estimated  blood loss was minimal.                           Localized mild inflammation characterized by                            erythema was found in the rectum. Biopsies were                            taken with a cold forceps for histology. Estimated                            blood loss was minimal.                           The exam was otherwise normal throughout the                            examined colon.                           The terminal ileum appeared normal.                           Anal papilla(e) were hypertrophied.                           No additional abnormalities were found on                            retroflexion. Complications:            No immediate complications. Estimated Blood Loss:     Estimated blood loss was minimal. Impression:               - Perianal skin tags found on perianal exam.                           -  Diverticulosis in the ascending colon.                           - One 3 mm polyp in the cecum, removed with a cold                            snare. Resected and retrieved.                           - Localized mild inflammation was found in the                            rectum. This may be secondary to the bowel prep.                            Biopsied to exclude dysplasia.                           - The  examined portion of the ileum was normal.                           - Anal papilla(e) were hypertrophied.                           - No abnormalities to explain abdominal pain or                            weight loss. Reassuring that patient has gained                            10lbs since his clinic visit. Recommendation:           - Patient has a contact number available for                            emergencies. The signs and symptoms of potential                            delayed complications were discussed with the                            patient. Return to normal activities tomorrow.                            Written discharge instructions were provided to the                            patient.                           - Resume previous diet.                           - Continue present medications.                           - Await pathology results.                           - Repeat colonoscopy (date not yet determined) for                            surveillance based on pathology results.                           - Follow up as needed in clinic. Indigo Barbian E. Candis Schatz, MD 04/05/2021 2:49:26 PM This report has been signed electronically.

## 2021-04-05 NOTE — Op Note (Signed)
[facility] Patient Name: Zachary Harper Procedure Date: 04/05/2021 1:58 PM MRN: [id number] Endoscopist: Nicki Reaper E. Candis Schatz , MD Age: 46 Referring MD:  Date of Birth: 1975/07/09 Gender: Male Account #: [id number] Procedure:                Upper GI endoscopy Indications:              Epigastric abdominal pain, Weight loss Medicines:                Monitored Anesthesia Care Procedure:                Pre-Anesthesia Assessment:                           - Prior to the procedure, a History and Physical                            was performed, and patient medications and                            allergies were reviewed. The patient's tolerance of                            previous anesthesia was also reviewed. The risks                            and benefits of the procedure and the sedation                            options and risks were discussed with the patient.                            All questions were answered, and informed consent                            was obtained. Prior Anticoagulants: The patient has                            taken no previous anticoagulant or antiplatelet                            agents. ASA Grade Assessment: III - A patient with                            severe systemic disease. After reviewing the risks                            and benefits, the patient was deemed in                            satisfactory condition to undergo the procedure.                           After obtaining informed consent, the endoscope was  passed under direct vision. Throughout the                            procedure, the patient's blood pressure, pulse, and                            oxygen saturations were monitored continuously. The                            Olympus Endoscope [id number] was introduced through                            the mouth, and advanced to the third part of                            duodenum.  The upper GI endoscopy was accomplished                            without difficulty. The patient tolerated the                            procedure well. Scope In: Scope Out: Findings:                 The examined portions of the nasopharynx,                            oropharynx and larynx were normal.                           A single island of salmon-colored mucosa was                            present at 38 cm. No other visible abnormalities                            were present. Biopsies were taken with a cold                            forceps for histology. Estimated blood loss was                            minimal.                           The exam of the esophagus was otherwise normal.                           Diffuse granular mucosa was found in the entire                            examined stomach. Biopsies were taken with a cold                            forceps for Helicobacter pylori testing. Estimated  blood loss was minimal.                           The exam of the stomach was otherwise normal.                           The examined duodenum was normal. Complications:            No immediate complications. Estimated Blood Loss:     Estimated blood loss was minimal. Impression:               - The examined portions of the nasopharynx,                            oropharynx and larynx were normal.                           - Single island of salmon-colored mucosa. Biopsied                            to exclude dysplasia.                           - Granular gastric mucosa. Biopsied.                           - Normal examined duodenum.                           - No endoscopic abnormalities to explain abdominal                            pain or weight loss Recommendation:           - Patient has a contact number available for                            emergencies. The signs and symptoms of potential                            delayed  complications were discussed with the                            patient. Return to normal activities tomorrow.                            Written discharge instructions were provided to the                            patient.                           - Resume previous diet.                           - Continue present medications.                           -  Await pathology results.                           - Proceed with colonoscopy Latorsha Curling E. Candis Schatz, MD 04/05/2021 2:42:19 PM This report has been signed electronically.

## 2021-04-05 NOTE — Progress Notes (Signed)
VSS, transported to PACU °

## 2021-04-05 NOTE — Progress Notes (Signed)
Pt's states no medical or surgical changes since previsit or office visit.  Vitals DT 

## 2021-04-05 NOTE — Progress Notes (Signed)
[facility] History and Physical   Primary Care Physician:  Pcp, No   Reason for Procedure:   Abdominal pain, weight loss  Plan:    EGD/colonoscopy     HPI: Zachary Harper is a 46 y.o. male undergoing EGD and colonoscopy to evaluate abdominal pain and unintentional weight loss.  He has been 125lbs in July of 2021.  When I saw him in clinic he was down to 100lbs.  Today he weighs 110lbs.  He has chronic abdominal pain which has been attributed to chronic pancreatitis in the past.  His pain has improved some in the past few months.   Past Medical History:  Diagnosis Date   Allergy    Hypertension    Pancreatitis     Past Surgical History:  Procedure Laterality Date   HERNIA REPAIR     NASAL FRACTURE SURGERY     TYMPANOSTOMY TUBE PLACEMENT Bilateral     Prior to Admission medications   Medication Sig Start Date End Date Taking? Authorizing Provider  pantoprazole (PROTONIX) 40 MG tablet TAKE 1 TABLET(40 MG) BY MOUTH DAILY 01/24/21  Yes Daryel November, MD  senna (SENOKOT) 8.6 MG TABS tablet Take 1 tablet (8.6 mg total) by mouth daily as needed for mild constipation. 12/04/20  Yes Daryel November, MD  aluminum-magnesium hydroxide-simethicone (MAALOX) 200-200-20 MG/5ML SUSP Take 30 mLs by mouth 4 (four) times daily -  before meals and at bedtime. Patient not taking: Reported on 04/05/2021 12/01/20   Jaynee Eagles, PA-C    Current Outpatient Medications  Medication Sig Dispense Refill   pantoprazole (PROTONIX) 40 MG tablet TAKE 1 TABLET(40 MG) BY MOUTH DAILY 30 tablet 1   senna (SENOKOT) 8.6 MG TABS tablet Take 1 tablet (8.6 mg total) by mouth daily as needed for mild constipation. 120 tablet 0   aluminum-magnesium hydroxide-simethicone (MAALOX) 790-240-97 MG/5ML SUSP Take 30 mLs by mouth 4 (four) times daily -  before meals and at bedtime. (Patient not taking: Reported on 04/05/2021) 300 mL 0   Current Facility-Administered Medications  Medication Dose Route  Frequency Provider Last Rate Last Admin   0.9 %  sodium chloride infusion  500 mL Intravenous Once Daryel November, MD        Allergies as of 04/05/2021   (No Known Allergies)    Family History  Problem Relation Age of Onset   Hypertension Father    Diabetes Father    Other Sister        Leg Issues   Depression Sister    Hypertension Paternal Grandmother    Asthma Daughter    Asthma Son    Colon cancer Neg Hx    Pancreatic cancer Neg Hx    Esophageal cancer Neg Hx    Liver cancer Neg Hx    Stomach cancer Neg Hx    Rectal cancer Neg Hx     Social History   Socioeconomic History   Marital status: Single    Spouse name: Not on file   Number of children: 2   Years of education: Not on file   Highest education level: Bachelor's degree (e.g., BA, AB, BS)  Occupational History   Occupation: Unemployed  Tobacco Use   Smoking status: Every Day    Packs/day: 1.00    Years: 6.00    Pack years: 6.00    Types: Cigarettes, Cigars    Start date: 05/31/2012   Smokeless tobacco: Never  Vaping Use   Vaping Use: Never used  Substance and Sexual Activity  Alcohol use: Yes    Alcohol/week: 12.0 - 24.0 standard drinks    Types: 12 - 24 Shots of liquor per week    Comment: daily; (08/22/11-reports drinking a fifth in last week, had not drank since getting out of hospital in april   Drug use: Yes    Types: Marijuana   Sexual activity: Not Currently    Partners: Female    Comment: Last intercourse was 2 years ago  Other Topics Concern   Not on file  Social History Narrative   Not on file   Social Determinants of Health   Financial Resource Strain: Not on file  Food Insecurity: Not on file  Transportation Needs: Not on file  Physical Activity: Not on file  Stress: Not on file  Social Connections: Not on file  Intimate Partner Violence: Not on file    Review of Systems:  All other review of systems negative except as mentioned in the HPI.  Physical Exam: Vital  signs BP 126/84    Pulse (!) 116    Temp 98.4 F (36.9 C) (Temporal)    Ht 5' 10.5" (1.791 m)    Wt 110 lb (49.9 kg)    SpO2 98%    BMI 15.56 kg/m   General:   Alert,  Well-developed, well-nourished, pleasant and cooperative in NAD Airway:  Mallampati 1 Lungs:  Clear throughout to auscultation.   Heart:  Regular rate and rhythm; no murmurs, clicks, rubs,  or gallops. Abdomen:  Soft, nontender and nondistended. Normal bowel sounds.   Neuro/Psych:  Normal mood and affect. A and O x 3   Fahim Kats E. Candis Schatz, MD [facility]

## 2021-04-09 ENCOUNTER — Telehealth: Payer: Self-pay

## 2021-04-09 NOTE — Telephone Encounter (Signed)
°  Follow up Call-  Call back number 04/05/2021  Post procedure Call Back phone  # [phone]  Permission to leave phone message Yes  Some recent data might be hidden     Left message

## 2021-04-09 NOTE — Telephone Encounter (Signed)
°  Follow up Call-  Call back number 04/05/2021  Post procedure Call Back phone  # [phone]  Permission to leave phone message Yes  Some recent data might be hidden     Left message

## 2021-04-10 NOTE — Progress Notes (Signed)
Mr. Zachary Harper,  The biopsies taken from your stomach were notable for mild reactive changes (hyperemia) which is a common finding and often related to use of certain medications (usually NSAIDs), but there was no evidence of Helicobacter pylori infection. This common finding is not felt to necessarily be a cause of any particular symptom and there is no specific treatment or further evaluation recommended.  The biopsies of your esophagus showed very mild reactive changes, most likely related to acid reflux.  There was no evidence of precancerous change (Barrett's esophagus).  No further follow up is recommended for this.  Continue to take the Protonix for reflux symptoms.  The polyp which I removed during your recent colonoscopy was proven to be completely benign but is considered a "pre-cancerous" polyp that MAY have grown into cancer if it had not been removed.  Studies shows that at least 20% of women over age 68 and 30% of men over age 97 have pre-cancerous polyps.  Based on current nationally recognized surveillance guidelines, I recommend that you have a repeat colonoscopy in 7 years.   If you develop any new rectal bleeding, abdominal pain or significant bowel habit changes, please contact me before then.  The biopsies of your rectum showed very mild reactive changes, most likely related to the bowel prep.  There was no evidence of precancerous changes or evidence of chronic inflammation.  No further evaluation for this is recommended.  Please follow up with me as needed in clinic.  Zachary Stillson E. Candis Schatz, MD [facility]

## 2021-04-29 ENCOUNTER — Other Ambulatory Visit: Payer: Self-pay | Admitting: Gastroenterology

## 2021-05-28 ENCOUNTER — Ambulatory Visit (INDEPENDENT_AMBULATORY_CARE_PROVIDER_SITE_OTHER): Payer: 59 | Admitting: Physician Assistant

## 2021-05-28 ENCOUNTER — Other Ambulatory Visit: Payer: Self-pay

## 2021-05-28 ENCOUNTER — Encounter: Payer: Self-pay | Admitting: Physician Assistant

## 2021-05-28 DIAGNOSIS — L820 Inflamed seborrheic keratosis: Secondary | ICD-10-CM

## 2021-05-28 NOTE — Progress Notes (Signed)
? ?  New Patient ?  ?Subjective  ?Zachary Harper is a 46 y.o. AA  male who presents for the following: Skin Problem (Left back of scalp x  2 years- + itch  & " I pick it" . No family or personal h/o of melanoma or non mole skin cancers. ). ? ? ?The following portions of the chart were reviewed this encounter and updated as appropriate:  Tobacco  Allergies  Meds  Problems  Med Hx  Surg Hx  Fam Hx   ?  ? ?Objective  ?Well appearing patient in no apparent distress; mood and affect are within normal limits. ? ?A focused examination was performed including scalp and feet. Relevant physical exam findings are noted in the Assessment and Plan. ? ?Left Occipital Scalp ?Stuck-on, crusted plaque. --Discussed benign etiology and prognosis.  ? ? ?Assessment & Plan  ?Seborrheic keratosis, inflamed ?Left Occipital Scalp ? ?Destruction of lesion - Left Occipital Scalp ?Complexity: simple   ?Destruction method: cryotherapy   ?Informed consent: discussed and consent obtained   ?Timeout:  patient name, date of birth, surgical site, and procedure verified ?Lesion destroyed using liquid nitrogen: Yes   ?Cryotherapy cycles:  3 ?Outcome: patient tolerated procedure well with no complications   ? ? ? ? ?I, Westlynn Fifer, PA-C, have reviewed all documentation's for this visit.  The documentation on 05/28/21 for the exam, diagnosis, procedures and orders are all accurate and complete. ?

## 2022-03-11 ENCOUNTER — Telehealth: Payer: 59 | Admitting: Physician Assistant

## 2022-03-11 DIAGNOSIS — K920 Hematemesis: Secondary | ICD-10-CM

## 2022-03-11 DIAGNOSIS — R1013 Epigastric pain: Secondary | ICD-10-CM

## 2022-03-11 NOTE — Patient Instructions (Signed)
  Zachary Harper, thank you for joining Zachary Rio, PA-C for today's virtual visit.  While this provider is not your primary care provider (PCP), if your PCP is located in our provider database this encounter information will be shared with them immediately following your visit.   [name] account gives you access to today's visit and all your visits, tests, and labs performed at [name] " click here if you don't have a [name] account or go to [email or web address]  Consent: (Patient) Zachary Harper provided verbal consent for this virtual visit at the beginning of the encounter.  Current Medications:  Current Outpatient Medications:    aluminum-magnesium hydroxide-simethicone (MAALOX) 200-200-20 MG/5ML SUSP, Take 30 mLs by mouth 4 (four) times daily -  before meals and at bedtime., Disp: 300 mL, Rfl: 0   diphenhydrAMINE (BENADRYL) 25 MG tablet, Take 25 mg by mouth every 6 (six) hours as needed., Disp: , Rfl:    Ibuprofen-diphenhydrAMINE HCl 200-25 MG CAPS, Take by mouth at bedtime as needed., Disp: , Rfl:    pantoprazole (PROTONIX) 40 MG tablet, Take 1 tablet (40 mg total) by mouth daily., Disp: 90 tablet, Rfl: 3   senna (SENOKOT) 8.6 MG TABS tablet, TAKE 1 TABLET BY MOUTH DAILY AS NEEDED FOR MILD CONSTIPATION, Disp: 120 tablet, Rfl: 0   Medications ordered in this encounter:  No orders of the defined types were placed in this encounter.    *If you need refills on other medications prior to your next appointment, please contact your pharmacy*  Follow-Up: Call back or seek an in-person evaluation if the symptoms worsen or if the condition fails to improve as anticipated.  [place] [phone]  Other Instructions Please use link below to be scheduled in person for evaluation. If unable to get in by tomorrow, I want you to be evaluated at nearest ER.    If you have been instructed to have an in-person  evaluation today at a local Urgent Care facility, please use the link below. It will take you to a list of all of our available [place] [facility], including address, phone number and hours of operation. Please do not delay care.  [facility]  If you or a family member do not have a primary care provider, use the link below to schedule a visit and establish care. When you choose a [facility] primary care physician or advanced practice provider, you gain a long-term partner in health. Find a Primary Care Provider  Learn more about [name] in-office and virtual care options: [name]

## 2022-03-11 NOTE — Progress Notes (Signed)
Virtual Visit Consent   Zachary Harper, you are scheduled for a virtual visit with a [facility] provider today. Just as with appointments in the office, your consent must be obtained to participate. Your consent will be active for this visit and any virtual visit you may have with one of our providers in the next 365 days. If you have a MyChart account, a copy of this consent can be sent to you electronically.  As this is a virtual visit, video technology does not allow for your provider to perform a traditional examination. This may limit your provider's ability to fully assess your condition. If your provider identifies any concerns that need to be evaluated in person or the need to arrange testing (such as labs, EKG, etc.), we will make arrangements to do so. Although advances in technology are sophisticated, we cannot ensure that it will always work on either your end or our end. If the connection with a video visit is poor, the visit may have to be switched to a telephone visit. With either a video or telephone visit, we are not always able to ensure that we have a secure connection.  By engaging in this virtual visit, you consent to the provision of healthcare and authorize for your insurance to be billed (if applicable) for the services provided during this visit. Depending on your insurance coverage, you may receive a charge related to this service.  I need to obtain your verbal consent now. Are you willing to proceed with your visit today? Zachary Harper has provided verbal consent on 03/11/2022 for a virtual visit (video or telephone). [name], [place]  Date: 03/11/2022 3:41 PM  Virtual Visit via Video Note   I, [name], connected with  Zachary Harper  ([id number], [birth year]) on 03/11/22 at  3:30 PM EST by a video-enabled telemedicine application and verified that I am speaking with the correct person using two identifiers.  Location: Patient: Virtual Visit  Location Patient: Home Provider: Virtual Visit Location Provider: Home Office   I discussed the limitations of evaluation and management by telemedicine and the availability of in person appointments. The patient expressed understanding and agreed to proceed.    History of Present Illness: Zachary Harper is a 46 y.o. who identifies as a male who was assigned male at birth, and is being seen today for about 2+ weeks of abdominal cramping with epigastric pain, increased reflux and episodes of nausea/vomiting, with some black emesis. Denies melena or hematochezia, but notes constipation with hard stools. Has Rx for daily Pantoprazole but has not been taking.  2 weeks of abdominal cramping with increased acid reflux. Is able to eat but causes nausea and will cause episodic vomiting.  Epigastric pain with coffee ground emesis.  Some constipation. Last BM -- last night. Denies melena, hematochezia.  COVID test 3 days ago -- negative.   HPI: HPI  Problems:  Patient Active Problem List   Diagnosis Date Noted   Generalized abdominal pain 07/31/2020   Dermatitis 07/31/2020   Right groin mass 05/23/2020   Enlarged lymph nodes 05/23/2020   Loss of weight 05/23/2020   History of alcoholism ([place]) 06/01/2018   History of pancreatitis 06/01/2018   HTN (hypertension) 07/05/2011    Allergies: No Known Allergies Medications:  Current Outpatient Medications:    aluminum-magnesium hydroxide-simethicone (MAALOX) 200-200-20 MG/5ML SUSP, Take 30 mLs by mouth 4 (four) times daily -  before meals and at bedtime., Disp: 300 mL, Rfl: 0   diphenhydrAMINE (BENADRYL) 25  MG tablet, Take 25 mg by mouth every 6 (six) hours as needed., Disp: , Rfl:    Ibuprofen-diphenhydrAMINE HCl 200-25 MG CAPS, Take by mouth at bedtime as needed., Disp: , Rfl:    pantoprazole (PROTONIX) 40 MG tablet, Take 1 tablet (40 mg total) by mouth daily., Disp: 90 tablet, Rfl: 3   senna (SENOKOT) 8.6 MG TABS tablet, TAKE 1 TABLET BY MOUTH  DAILY AS NEEDED FOR MILD CONSTIPATION, Disp: 120 tablet, Rfl: 0  Observations/Objective: Patient is well-developed, well-nourished in no acute distress.  Resting comfortably at home.  Head is normocephalic, atraumatic.  No labored breathing. Speech is clear and coherent with logical content.  Patient is alert and oriented at baseline.   Assessment and Plan: 1. Coffee ground emesis  2. Epigastric pain  Needs in person evaluation. Giving chronicity and absence of fever, SOB, LH/dizziness, can be evaluated first at Urgent Care since without PCP. Link sent to get scheduled. If unable to get in tonight and anything worsening, needs ER evaluation.   Follow Up Instructions: I discussed the assessment and treatment plan with the patient. The patient was provided an opportunity to ask questions and all were answered. The patient agreed with the plan and demonstrated an understanding of the instructions.  A copy of instructions were sent to the patient via MyChart unless otherwise noted below.   The patient was advised to call back or seek an in-person evaluation if the symptoms worsen or if the condition fails to improve as anticipated.  Time:  I spent 10 minutes with the patient via telehealth technology discussing the above problems/concerns.    [name], PA-C

## 2022-03-12 ENCOUNTER — Ambulatory Visit (HOSPITAL_COMMUNITY)
Admission: RE | Admit: 2022-03-12 | Discharge: 2022-03-12 | Disposition: A | Discharge status: Home / Self Care | Payer: Managed Care, Other (non HMO) | Source: Ambulatory Visit | Attending: Emergency Medicine | Admitting: Emergency Medicine

## 2022-03-12 ENCOUNTER — Encounter (HOSPITAL_COMMUNITY): Payer: Self-pay

## 2022-03-12 VITALS — BP 165/126 | HR 60 | Temp 98.3°F | Resp 12

## 2022-03-12 DIAGNOSIS — R1013 Epigastric pain: Secondary | ICD-10-CM

## 2022-03-12 MED ORDER — KETOROLAC TROMETHAMINE 30 MG/ML IJ SOLN
30.0000 mg | Freq: Once | INTRAMUSCULAR | Status: AC
  Administered 2022-03-12: 30 mg via INTRAMUSCULAR

## 2022-03-12 MED ORDER — ONDANSETRON 4 MG PO TBDP: 4.0000 mg | ORAL_TABLET | Freq: Three times a day (TID) | ORAL | 0 refills | Status: DC | PRN

## 2022-03-12 MED ORDER — KETOROLAC TROMETHAMINE 30 MG/ML IJ SOLN
INTRAMUSCULAR | Status: AC
  Filled 2022-03-12: qty 1

## 2022-03-12 MED ORDER — CIPROFLOXACIN HCL 500 MG PO TABS: 500.0000 mg | ORAL_TABLET | Freq: Two times a day (BID) | ORAL | 0 refills | Status: DC

## 2022-03-12 NOTE — ED Provider Notes (Signed)
[facility]    CSN: [id number] Arrival date & time: 03/12/22  1520      History   Chief Complaint Chief Complaint  Patient presents with   Abdominal Pain    Constipation, Stomach Pains - Entered by patient    HPI Zachary Harper is a 46 y.o. male.   For evaluation of constant generalized abdominal pain for 3 weeks.  Endorses diarrhea and vomiting occurring over the last "couple of days ".  Last occurrence of vomiting 1 day ago, all episodes of vomiting have been self-induced, emesis has been described as food and bile as well as coffee ground emesis.  Last occurrence of diarrhea 3 days ago, described as loose, brown in color.  Has chronic abdominal pain at baseline, endorses that this pain feels different as it is centralized.  Associated bloating, subjective fevers.  Is been tolerating food and liquids.  Endorses that food does not worsen symptoms.  Has not attempted treatment of symptoms.  History of H. pylori, gastritis, pancreatitis, alcoholism, constipation.   Past Medical History:  Diagnosis Date   Allergy    Hypertension    Pancreatitis     Patient Active Problem List   Diagnosis Date Noted   Generalized abdominal pain 07/31/2020   Dermatitis 07/31/2020   Right groin mass 05/23/2020   Enlarged lymph nodes 05/23/2020   Loss of weight 05/23/2020   History of alcoholism ([place]) 06/01/2018   History of pancreatitis 06/01/2018   HTN (hypertension) 07/05/2011    Past Surgical History:  Procedure Laterality Date   HERNIA REPAIR     NASAL FRACTURE SURGERY     TYMPANOSTOMY TUBE PLACEMENT Bilateral        Home Medications    Prior to Admission medications   Medication Sig Start Date End Date Taking? Authorizing Provider  aluminum-magnesium hydroxide-simethicone (MAALOX) 211-941-74 MG/5ML SUSP Take 30 mLs by mouth 4 (four) times daily -  before meals and at bedtime. 12/01/20   Jaynee Eagles, PA-C  diphenhydrAMINE (BENADRYL) 25 MG tablet Take 25 mg by mouth  every 6 (six) hours as needed.    [provider]  Ibuprofen-diphenhydrAMINE HCl 200-25 MG CAPS Take by mouth at bedtime as needed.    [provider]  pantoprazole (PROTONIX) 40 MG tablet Take 1 tablet (40 mg total) by mouth daily. 04/05/21   Daryel November, MD  senna (SENOKOT) 8.6 MG TABS tablet TAKE 1 TABLET BY MOUTH DAILY AS NEEDED FOR MILD CONSTIPATION 04/29/21   Daryel November, MD    Family History Family History  Problem Relation Age of Onset   Hypertension Father    Diabetes Father    Other Sister        Leg Issues   Depression Sister    Hypertension Paternal Grandmother    Asthma Daughter    Asthma Son    Colon cancer Neg Hx    Pancreatic cancer Neg Hx    Esophageal cancer Neg Hx    Liver cancer Neg Hx    Stomach cancer Neg Hx    Rectal cancer Neg Hx     Social History Social History   Tobacco Use   Smoking status: Every Day    Types: Cigars   Smokeless tobacco: Never   Tobacco comments:    Smokes the "little" cigars that look like cigarettes  Vaping Use   Vaping Use: Never used  Substance Use Topics   Alcohol use: Yes    Alcohol/week: 12.0 - 24.0 standard drinks of alcohol  Types: 12 - 24 Shots of liquor per week    Comment: daily; (08/22/11-reports drinking a fifth in last week, had not drank since getting out of hospital in april   Drug use: Yes    Types: Marijuana     Allergies   Patient has no known allergies.   Review of Systems Review of Systems Defer to HPI   Physical Exam Triage Vital Signs ED Triage Vitals [03/12/22 1606]  Enc Vitals Group     BP (!) 165/126     Pulse Rate 60     Resp 12     Temp 98.3 F (36.8 C)     Temp Source Oral     SpO2 96 %     Weight      Height      Head Circumference      Peak Flow      Pain Score 10     Pain Loc      Pain Edu?      Excl. in [place]?    No data found.  Updated Vital Signs BP (!) 165/126 (BP Location: Right Arm)   Pulse 60   Temp 98.3 F (36.8 C) (Oral)    Resp 12   SpO2 96%   Visual Acuity Right Eye Distance:   Left Eye Distance:   Bilateral Distance:    Right Eye Near:   Left Eye Near:    Bilateral Near:     Physical Exam Constitutional:      Appearance: Normal appearance. He is well-developed.  Eyes:     Extraocular Movements: Extraocular movements intact.  Pulmonary:     Effort: Pulmonary effort is normal.  Abdominal:     General: Abdomen is flat. Bowel sounds are normal.     Palpations: Abdomen is soft.     Tenderness: There is abdominal tenderness in the epigastric area. There is no right CVA tenderness, left CVA tenderness or guarding. Negative signs include Murphy's sign and McBurney's sign.  Skin:    General: Skin is warm.  Neurological:     Mental Status: He is alert and oriented to person, place, and time. Mental status is at baseline.      UC Treatments / Results  Labs (all labs ordered are listed, but only abnormal results are displayed) Labs Reviewed - No data to display  EKG   Radiology No results found.  Procedures Procedures (including critical care time)  Medications Ordered in UC Medications - No data to display  Initial Impression / Assessment and Plan / UC Course  I have reviewed the triage vital signs and the nursing notes.  Pertinent labs & imaging results that were available during my care of the patient were reviewed by me and considered in my medical decision making (see chart for details).  Epigastric abdominal pain  Vital signs are stable, blood pressure elevated but patient is endorsing severe pain while in triage and during examination, on exam mild tenderness is noted to the epigastric region without guarding, no increased bowel sounds or bloating, stomach is flat and symmetrical, as vomiting has been self-induced advised patient to stop and Zofran prescribed to manage nausea, Toradol injection given in office for management of pain, may use Tylenol outpatient, based on history we  will prophylactically provide antibiotic, ciprofloxacin prescribed and given strict precautions that if no improvement seen within symptoms in 72 hours he is to go to the nearest emergency department for immediate evaluation and full workup, given strict precautions that if  he continues to have any coffee-ground emesis whether spontaneously self-induced he is to go to the nearest emergency department for immediate evaluation Final Clinical Impressions(s) / UC Diagnoses   Final diagnoses:  None   Discharge Instructions   None    ED Prescriptions   None    PDMP not reviewed this encounter.   Hans Eden, NP 03/12/22 [id number]

## 2022-03-12 NOTE — ED Triage Notes (Signed)
Pt is here for stomach pain , constipation x 3wks , vomiting and nausea x2days

## 2022-03-12 NOTE — Discharge Instructions (Addendum)
Today you are being treated for your abdominal pain, due to your history you will be prophylactically started on antibiotic  Begin ciprofloxacin every morning and every evening for 5 days, ideally you will see improvement in about 48 hours if a germ is present  You have Been given an injection of here today in the office to help manage her pain, while home please use Tylenol instead of ibuprofen to prevent further stomach irritation  Begin use of Zofran every 8 hours to help calm your nausea, please do not continue to make yourself vomit as this will cause irritation   If you do not see any improvement in your symptoms in 72 hours please go to the nearest emergency department for a full workup and evaluation  Point if you vomit coffee-ground emesis again please go to the nearest emergency department for immediate evaluation  If your symptoms improved but began to recur you may follow-up with your gastrointestinal specialist if you are still a patient

## 2022-03-31 ENCOUNTER — Telehealth: Payer: Self-pay

## 2022-03-31 ENCOUNTER — Other Ambulatory Visit: Payer: Self-pay

## 2022-03-31 ENCOUNTER — Emergency Department (HOSPITAL_COMMUNITY)
Admission: EM | Admit: 2022-03-31 | Discharge: 2022-03-31 | Discharge status: Against Medical Advice | Payer: Managed Care, Other (non HMO) | Attending: Emergency Medicine | Admitting: Emergency Medicine

## 2022-03-31 DIAGNOSIS — Z5321 Procedure and treatment not carried out due to patient leaving prior to being seen by health care provider: Secondary | ICD-10-CM | POA: Diagnosis not present

## 2022-03-31 DIAGNOSIS — R3589 Other polyuria: Secondary | ICD-10-CM | POA: Insufficient documentation

## 2022-03-31 DIAGNOSIS — R110 Nausea: Secondary | ICD-10-CM | POA: Diagnosis not present

## 2022-03-31 DIAGNOSIS — R109 Unspecified abdominal pain: Secondary | ICD-10-CM | POA: Insufficient documentation

## 2022-03-31 LAB — CBC
HCT: 34.6 % — ABNORMAL LOW (ref 39.0–52.0)
Hemoglobin: 12.4 g/dL — ABNORMAL LOW (ref 13.0–17.0)
MCH: 33.4 pg (ref 26.0–34.0)
MCHC: 35.8 g/dL (ref 30.0–36.0)
MCV: 93.3 fL (ref 80.0–100.0)
Platelets: 525 10*3/uL — ABNORMAL HIGH (ref 150–400)
RBC: 3.71 MIL/uL — ABNORMAL LOW (ref 4.22–5.81)
RDW: 13.3 % (ref 11.5–15.5)
WBC: 6.9 10*3/uL (ref 4.0–10.5)
nRBC: 0 % (ref 0.0–0.2)

## 2022-03-31 LAB — URINALYSIS, ROUTINE W REFLEX MICROSCOPIC
Glucose, UA: NEGATIVE mg/dL
Hgb urine dipstick: NEGATIVE
Ketones, ur: NEGATIVE mg/dL
Nitrite: NEGATIVE
Protein, ur: 30 mg/dL — AB
Specific Gravity, Urine: 1.024 (ref 1.005–1.030)
pH: 5 (ref 5.0–8.0)

## 2022-03-31 LAB — COMPREHENSIVE METABOLIC PANEL
ALT: 14 U/L (ref 0–44)
AST: 15 U/L (ref 15–41)
Albumin: 3.3 g/dL — ABNORMAL LOW (ref 3.5–5.0)
Alkaline Phosphatase: 51 U/L (ref 38–126)
Anion gap: 10 (ref 5–15)
BUN: 21 mg/dL — ABNORMAL HIGH (ref 6–20)
CO2: 24 mmol/L (ref 22–32)
Calcium: 9 mg/dL (ref 8.9–10.3)
Chloride: 100 mmol/L (ref 98–111)
Creatinine, Ser: 1.6 mg/dL — ABNORMAL HIGH (ref 0.61–1.24)
GFR, Estimated: 53 mL/min — ABNORMAL LOW (ref >60)
Glucose, Bld: 107 mg/dL — ABNORMAL HIGH (ref 70–99)
Potassium: 3.4 mmol/L — ABNORMAL LOW (ref 3.5–5.1)
Sodium: 134 mmol/L — ABNORMAL LOW (ref 135–145)
Total Bilirubin: 0.6 mg/dL (ref 0.3–1.2)
Total Protein: 5.9 g/dL — ABNORMAL LOW (ref 6.5–8.1)

## 2022-03-31 LAB — LIPASE, BLOOD: Lipase: 370 U/L — ABNORMAL HIGH (ref 11–51)

## 2022-03-31 LAB — CBG MONITORING, ED: Glucose-Capillary: 115 mg/dL — ABNORMAL HIGH (ref 70–99)

## 2022-03-31 NOTE — ED Provider Triage Note (Signed)
Emergency Medicine Provider Triage Evaluation Note  Zachary Harper , a 47 y.o. male  was evaluated in triage.  Pt complains of abdominal pain x 1 month. Symptoms have been persistent and associated with nausea. No vomiting today. Has been feeling more thirsty and endorses some polyuria, but denies hx of DM. Was seen for these complaints at [facility] and given course of Ciprofloxacin for presumed stomach bug. Abdominal SHx notable for inguinal hernia repair.  Review of Systems  Positive: As above Negative: As above  Physical Exam  BP 120/85   Pulse (!) 116   Temp 97.7 F (36.5 C)   Resp 19   Ht '5\' 10"'$  (1.778 m)   Wt 49.9 kg   SpO2 100%   BMI 15.78 kg/m  Gen:   Awake, no distress  Resp:  Normal effort  MSK:   Moves extremities without difficulty  Other:  Thin, frail appearing. Abdomen is nontender.  Medical Decision Making  Medically screening exam initiated at 1:03 AM.  Appropriate orders placed.  Zachary Harper was informed that the remainder of the evaluation will be completed by another provider, this initial triage assessment does not replace that evaluation, and the importance of remaining in the ED until their evaluation is complete.  Abdominal pain - no TTP in triage. Labs initiated.   Antonietta Breach, PA-C 03/31/22 7494

## 2022-03-31 NOTE — ED Triage Notes (Signed)
Pt arrives with c/o ABD pain that started about 3 days ago. Pt endorses n/v.

## 2022-03-31 NOTE — Telephone Encounter (Signed)
Pt was seen this morning around 1am and is calling for his lab results and AVS, pt does not see AVS on mychart. Advised pt labs are not reflecting at this time. Pt would like a follow up call.

## 2022-03-31 NOTE — Telephone Encounter (Signed)
Patient called, left VM to return the call. If he returns the call, he will need to speak to a nurse. Patient will need to be triaged for any symptoms, unable to give lab results due to not seen by a provider in the ED. Patient was seen in the ED, but left without seeing a provider, he only had labs done.

## 2022-03-31 NOTE — ED Notes (Signed)
Patient did not respond for room x3

## 2022-04-08 ENCOUNTER — Emergency Department (HOSPITAL_COMMUNITY)
Admission: EM | Admit: 2022-04-08 | Discharge: 2022-04-08 | Disposition: A | Discharge status: Home / Self Care | Payer: Managed Care, Other (non HMO) | Attending: Emergency Medicine | Admitting: Emergency Medicine

## 2022-04-08 ENCOUNTER — Other Ambulatory Visit: Payer: Self-pay

## 2022-04-08 ENCOUNTER — Encounter (HOSPITAL_COMMUNITY): Payer: Self-pay

## 2022-04-08 DIAGNOSIS — K5900 Constipation, unspecified: Secondary | ICD-10-CM | POA: Diagnosis not present

## 2022-04-08 DIAGNOSIS — I10 Essential (primary) hypertension: Secondary | ICD-10-CM | POA: Diagnosis not present

## 2022-04-08 DIAGNOSIS — K859 Acute pancreatitis without necrosis or infection, unspecified: Secondary | ICD-10-CM | POA: Diagnosis not present

## 2022-04-08 DIAGNOSIS — R1013 Epigastric pain: Secondary | ICD-10-CM | POA: Diagnosis present

## 2022-04-08 LAB — URINALYSIS, ROUTINE W REFLEX MICROSCOPIC
Bacteria, UA: NONE SEEN
Bilirubin Urine: NEGATIVE
Glucose, UA: NEGATIVE mg/dL
Hgb urine dipstick: NEGATIVE
Ketones, ur: NEGATIVE mg/dL
Leukocytes,Ua: NEGATIVE
Nitrite: NEGATIVE
Protein, ur: 30 mg/dL — AB
Specific Gravity, Urine: 1.023 (ref 1.005–1.030)
pH: 5 (ref 5.0–8.0)

## 2022-04-08 LAB — COMPREHENSIVE METABOLIC PANEL
ALT: 11 U/L (ref 0–44)
AST: 11 U/L — ABNORMAL LOW (ref 15–41)
Albumin: 3.4 g/dL — ABNORMAL LOW (ref 3.5–5.0)
Alkaline Phosphatase: 61 U/L (ref 38–126)
Anion gap: 7 (ref 5–15)
BUN: 11 mg/dL (ref 6–20)
CO2: 27 mmol/L (ref 22–32)
Calcium: 9.3 mg/dL (ref 8.9–10.3)
Chloride: 102 mmol/L (ref 98–111)
Creatinine, Ser: 1.11 mg/dL (ref 0.61–1.24)
GFR, Estimated: >60 mL/min (ref >60)
Glucose, Bld: 97 mg/dL (ref 70–99)
Potassium: 3.8 mmol/L (ref 3.5–5.1)
Sodium: 136 mmol/L (ref 135–145)
Total Bilirubin: 0.4 mg/dL (ref 0.3–1.2)
Total Protein: 6.4 g/dL — ABNORMAL LOW (ref 6.5–8.1)

## 2022-04-08 LAB — CBC WITH DIFFERENTIAL/PLATELET
Abs Immature Granulocytes: 0.01 10*3/uL (ref 0.00–0.07)
Basophils Absolute: 0.1 10*3/uL (ref 0.0–0.1)
Basophils Relative: 1 %
Eosinophils Absolute: 0.1 10*3/uL (ref 0.0–0.5)
Eosinophils Relative: 1 %
HCT: 39.2 % (ref 39.0–52.0)
Hemoglobin: 14.1 g/dL (ref 13.0–17.0)
Immature Granulocytes: 0 %
Lymphocytes Relative: 29 %
Lymphs Abs: 2 10*3/uL (ref 0.7–4.0)
MCH: 33.3 pg (ref 26.0–34.0)
MCHC: 36 g/dL (ref 30.0–36.0)
MCV: 92.7 fL (ref 80.0–100.0)
Monocytes Absolute: 0.5 10*3/uL (ref 0.1–1.0)
Monocytes Relative: 7 %
Neutro Abs: 4.1 10*3/uL (ref 1.7–7.7)
Neutrophils Relative %: 62 %
Platelets: 537 10*3/uL — ABNORMAL HIGH (ref 150–400)
RBC: 4.23 MIL/uL (ref 4.22–5.81)
RDW: 13.6 % (ref 11.5–15.5)
WBC: 6.6 10*3/uL (ref 4.0–10.5)
nRBC: 0 % (ref 0.0–0.2)

## 2022-04-08 LAB — LIPASE, BLOOD: Lipase: 220 U/L — ABNORMAL HIGH (ref 11–51)

## 2022-04-08 MED ORDER — OXYCODONE HCL 5 MG PO TABS: 5.0000 mg | ORAL_TABLET | ORAL | 0 refills | Status: AC | PRN

## 2022-04-08 MED ORDER — MORPHINE SULFATE (PF) 4 MG/ML IV SOLN: 4.0000 mg | Freq: Once | INTRAVENOUS | Status: DC

## 2022-04-08 MED ORDER — ONDANSETRON HCL 4 MG/2ML IJ SOLN: 4.0000 mg | Freq: Once | INTRAMUSCULAR | Status: DC

## 2022-04-08 MED ORDER — LACTATED RINGERS IV BOLUS: 1000.0000 mL | Freq: Once | INTRAVENOUS | Status: DC

## 2022-04-08 MED ORDER — ONDANSETRON 4 MG PO TBDP: 4.0000 mg | ORAL_TABLET | Freq: Three times a day (TID) | ORAL | 0 refills | Status: DC | PRN

## 2022-04-08 MED ORDER — ALUM & MAG HYDROXIDE-SIMETH 200-200-20 MG/5ML PO SUSP
30.0000 mL | Freq: Once | ORAL | Status: AC
  Administered 2022-04-08: 30 mL via ORAL
  Filled 2022-04-08: qty 30

## 2022-04-08 MED ORDER — LACTATED RINGERS IV BOLUS
1000.0000 mL | Freq: Once | INTRAVENOUS | Status: AC
  Administered 2022-04-08: 1000 mL via INTRAVENOUS

## 2022-04-08 MED ORDER — LIDOCAINE VISCOUS HCL 2 % MT SOLN
15.0000 mL | Freq: Once | OROMUCOSAL | Status: AC
  Administered 2022-04-08: 15 mL via ORAL
  Filled 2022-04-08: qty 15

## 2022-04-08 MED ORDER — OXYCODONE HCL 5 MG PO TABS
5.0000 mg | ORAL_TABLET | Freq: Once | ORAL | Status: AC
  Administered 2022-04-08: 5 mg via ORAL
  Filled 2022-04-08: qty 1

## 2022-04-08 MED ORDER — ONDANSETRON HCL 4 MG/2ML IJ SOLN
4.0000 mg | Freq: Once | INTRAMUSCULAR | Status: AC
  Administered 2022-04-08: 4 mg via INTRAVENOUS
  Filled 2022-04-08: qty 2

## 2022-04-08 MED ORDER — ACETAMINOPHEN 500 MG PO TABS
1000.0000 mg | ORAL_TABLET | Freq: Once | ORAL | Status: AC
  Administered 2022-04-08: 1000 mg via ORAL
  Filled 2022-04-08: qty 2

## 2022-04-08 MED ORDER — PANTOPRAZOLE SODIUM 40 MG IV SOLR: 40.0000 mg | Freq: Once | INTRAVENOUS | Status: DC

## 2022-04-08 NOTE — Discharge Instructions (Addendum)
Thank you for coming to [facility] Emergency [facility]. You were seen for abdominal pain. We did an exam, labs, and these showed pancreatitis. You may also have a gastritis component contributing to your pain from acid in the stomach. We have refilled your zofran to take for nausea/vomiting every 8 hours as needed. For your constipation, you can take your senna as prescribed and also take miralax 1-2 capfuls per day. You can take '650mg'$  tylenol (acetaminophen) every 4-6 hours. We also have prescribed you a few pills of oxycodone for your abdominal pain for severe breakthrough pain, however these may make you more constipated.  Please follow up with your primary care provider within 1 week. Please call your gastroenterologist today to make an appointment for sometime in the next 1-2 weeks. Please continue to not drink alcohol, as this can worsen your condition. Please continue to take your protonix as prescribed.   Do not hesitate to return to the ED or call 911 if you experience: -Worsening symptoms -Lightheadedness, passing out -Fevers/chills -Anything else that concerns you

## 2022-04-08 NOTE — ED Triage Notes (Signed)
Patient was seen on 1/15 for abd pain that started 3 days prior.  Patient reports he waited 6 hrs and could not wait any longer.  Patient called his GI office requesting labs but due to the fact he didn't not see a provider they would not give him his lab results. Patient had endoscopy that biopsy came back as hyermia but not H pylori but on jan 15 his lipase was 370.  Patient reports continued abd pain.

## 2022-04-08 NOTE — ED Provider Triage Note (Signed)
Emergency Medicine Provider Triage Evaluation Note  Zachary Harper , a 47 y.o. male  was evaluated in triage.  Pt complains of left upper quadrant and epigastric abdominal pain.  Symptoms have been present for at least 1.5 months.  He presented 8 days ago for same, but left due to wait time.  Called his GI but was unable to have his labs reviewed as he has not seen.  He has not had a normal diet throughout that time due to his symptoms.  Describes the pain in his sharp and burning.  Does radiate into the back.  Review of Systems  Positive: As above Negative: As above  Physical Exam  BP (!) 146/132 (BP Location: Right Arm)   Pulse (!) 126   Temp 98.9 F (37.2 C) (Oral)   Resp 16   Ht 5' 10.5" (1.791 m)   Wt 49.9 kg   SpO2 100%   BMI 15.56 kg/m  Gen:   Awake, no distress   Resp:  Normal effort  MSK:   Moves extremities without difficulty  Other:  Mild epigastric and left upper quadrant abdominal TTP  Medical Decision Making  Medically screening exam initiated at 11:58 AM.  Appropriate orders placed.  Zachary Harper. was informed that the remainder of the evaluation will be completed by another provider, this initial triage assessment does not replace that evaluation, and the importance of remaining in the ED until their evaluation is complete.  Abdominal pain labs ordered   Roylene Reason, Hershal Coria 04/08/22 1200

## 2022-04-08 NOTE — ED Triage Notes (Signed)
Pt arrived POV from home c/o generalized abdominal pain x several weeks. Pt came on 1/15 but left AMA. Pt states he went to urgent care 3 weeks ago and they gave him medicine but he is not better. Pt states he can't eat and is fatigued, dehydrated.

## 2022-04-08 NOTE — ED Provider Notes (Signed)
[facility] Provider Note   CSN: [id number] Arrival date & time: 04/08/22  1033     History  Chief Complaint  Patient presents with   Abdominal Pain    Zachary Harper. is a 47 y.o. male with HTN, h/o ETOH, h/o pancreatitis, who presents with abd pain.    Pt complains of left upper quadrant and epigastric abdominal pain.  Symptoms have been present for at least 1.5 months, occurring every day.  He presented 8 days ago for same, but left due to wait time because he had to go to work. But now the pain has gotten to the point where he can't go to work. Called his GI but was unable to have his labs reviewed as he has not seen.  He has not had a normal diet throughout that time due to his symptoms.  Describes the pain in his sharp and burning.  Does radiate into the back. Also endorses constipation, last BM was yesterday but it was small. Also endorsing that urine is difficult to get out. Was seen at urgent care and told to come to ED if his symptoms do not go away, and since they have not gone away he presents back to ED today. Does drink alcohol but hasn't had any EtOH since mid-November 2023.   Abdominal Pain      Home Medications Prior to Admission medications   Medication Sig Start Date End Date Taking? Authorizing Provider  aluminum-magnesium hydroxide-simethicone (MAALOX) 130-865-78 MG/5ML SUSP Take 30 mLs by mouth 4 (four) times daily -  before meals and at bedtime. 12/01/20   Jaynee Eagles, PA-C  ciprofloxacin (CIPRO) 500 MG tablet Take 1 tablet (500 mg total) by mouth every 12 (twelve) hours. 03/12/22   Hans Eden, NP  diphenhydrAMINE (BENADRYL) 25 MG tablet Take 25 mg by mouth every 6 (six) hours as needed.    [provider]  Ibuprofen-diphenhydrAMINE HCl 200-25 MG CAPS Take by mouth at bedtime as needed.    [provider]  ondansetron (ZOFRAN-ODT) 4 MG disintegrating tablet Take 1 tablet (4 mg total) by  mouth every 8 (eight) hours as needed for nausea or vomiting. 04/08/22 05/08/22  Audley Hose, MD  pantoprazole (PROTONIX) 40 MG tablet TAKE 1 TABLET(40 MG) BY MOUTH DAILY 04/14/22   Daryel November, MD  senna (SENOKOT) 8.6 MG TABS tablet TAKE 1 TABLET BY MOUTH DAILY AS NEEDED FOR MILD CONSTIPATION 04/29/21   Daryel November, MD      Allergies    Patient has no known allergies.    Review of Systems   Review of Systems  Gastrointestinal:  Positive for abdominal pain.   Review of systems Negative and Positive for f/c.  A 10 point review of systems was performed and is negative unless otherwise reported in HPI.  Physical Exam Updated Vital Signs BP (!) 167/117   Pulse 73   Temp 98.3 F (36.8 C) (Oral)   Resp 20   Ht 5' 10.5" (1.791 m)   Wt 49.9 kg   SpO2 100%   BMI 15.56 kg/m  Physical Exam General: Normal appearing male, lying in bed.  HEENT: PERRLA, Sclera anicteric, MMM, trachea midline.  Cardiology: RRR, no murmurs/rubs/gallops. BL radial and DP pulses equal bilaterally.  Resp: Normal respiratory rate and effort. CTAB, no wheezes, rhonchi, crackles.  Abd: Soft, non-tender, non-distended. No rebound tenderness or guarding.  GU: Deferred. MSK: No peripheral edema or signs of trauma. Extremities without  deformity or TTP. No cyanosis or clubbing. Skin: warm, dry. No rashes or lesions. Back: No CVA tenderness Neuro: A&Ox4, CNs II-XII grossly intact. MAEs. Sensation grossly intact.  Psych: Normal mood and affect.   ED Results / Procedures / Treatments   Labs (all labs ordered are listed, but only abnormal results are displayed) Labs Reviewed  CBC WITH DIFFERENTIAL/PLATELET - Abnormal; Notable for the following components:      Result Value   Platelets 537 (*)    All other components within normal limits  COMPREHENSIVE METABOLIC PANEL - Abnormal; Notable for the following components:   Total Protein 6.4 (*)    Albumin 3.4 (*)    AST 11 (*)    All other components  within normal limits  LIPASE, BLOOD - Abnormal; Notable for the following components:   Lipase 220 (*)    All other components within normal limits  URINALYSIS, ROUTINE W REFLEX MICROSCOPIC - Abnormal; Notable for the following components:   APPearance HAZY (*)    Protein, ur 30 (*)    All other components within normal limits    EKG EKG Interpretation  Date/Time:  Tuesday April 08 2022 14:29:06 EST Ventricular Rate:  74 PR Interval:  133 QRS Duration: 81 QT Interval:  407 QTC Calculation: 452 R Axis:   39 Text Interpretation: Sinus rhythm Probable left atrial enlargement Confirmed by Cindee Lame [phone]) on 04/08/2022 3:12:11 PM  Radiology No results found.  Procedures Procedures    Medications Ordered in ED Medications  alum & mag hydroxide-simeth (MAALOX/MYLANTA) 200-200-20 MG/5ML suspension 30 mL (30 mLs Oral Given 04/08/22 1431)    And  lidocaine (XYLOCAINE) 2 % viscous mouth solution 15 mL (15 mLs Oral Given 04/08/22 1431)  acetaminophen (TYLENOL) tablet 1,000 mg (1,000 mg Oral Given 04/08/22 1431)  ondansetron (ZOFRAN) injection 4 mg (4 mg Intravenous Given 04/08/22 1432)  lactated ringers bolus 1,000 mL (0 mLs Intravenous Stopped 04/08/22 1548)  oxyCODONE (Oxy IR/ROXICODONE) immediate release tablet 5 mg (5 mg Oral Given 04/08/22 1431)    ED Course/ Medical Decision Making/ A&P                          Medical Decision Making Amount and/or Complexity of Data Reviewed Labs:  Decision-making details documented in ED Course.  Risk OTC drugs. Prescription drug management.    This patient presents to the ED for concern of abdominal pain, this involves an extensive number of treatment options, and is a complaint that carries with it a high risk of complications and morbidity.  I considered the following differential and admission for this acute, potentially life threatening condition.   MDM:    For DDX for abdominal pain includes but is not limited  to:  Abdominal exam without peritoneal signs. No evidence of acute abdomen at this time. Highest concern for pancreatitis given epigastric pain radiating to the back a/w nausea and h/o pancreatitis. Also consider GERD, gastritis given LUQ pain and burning sensation. Low suspicion for acute hepatobiliary disease (including acute cholecystitis or cholangitis), appendicitis, vascular catastrophe, viscus perforation, or diverticulitis. Patient does report constipation as well as abdominal pain with nausea however he is not distended and stomach pain for 1.5 months not consistent with SBO, did endorse a small BM yesterday.    Clinical Course as of 04/17/22 2305  Tue Apr 08, 2022  1304 WBC: 6.6 No leukocytosis [HN]  1304 Hemoglobin: 14.1 [HN]  1347 Lipase(!): 220 +pancreatitis [HN]  1347 Comprehensive metabolic  panel(!) Unremarkable [HN]  1356 Urinalysis, Routine w reflex microscopic(!) 30 protein but otherwise unremarkable [HN]  1358 HR 89 bpm  [HN]    Clinical Course User Index [HN] Audley Hose, MD    Labs: I Ordered, and personally interpreted labs.  The pertinent results include:  those listed above  Additional history obtained from chart review.    Cardiac Monitoring: The patient was maintained on a cardiac monitor.  I personally viewed and interpreted the cardiac monitored which showed an underlying rhythm of: NSR  Reevaluation: After the interventions noted above, I reevaluated the patient and found that they have :improved  Social Determinants of Health: Patient lives independently   Disposition:  Patient is noted to have elevated lipase indicating pancreatitis. Possible that patient has had ongoing or recurrent pancreatitis for the length of his symptoms. Discussed this with patient. He states is pain is relatively controlled currently and he is afebrile, HDS, he is stable to f/u outpatient. Discussed with patient bowel rest and staying well hydrated. He is given 1L IVF  for resuscitation and instructed to stay well hydrated at home. He is refilled on his zofran for nausea. For his constipation, he is advised to take senna as prescribed and to add miralax 1-2 capfuls per day. For pain he is instructed to take tylenol and prescribed a few pills of oxycodone but is advised that these may make him more constipated. Discussed that there may be a gastritis component to his pain so advised to continue taking protonix as prescribed and to continue to abstain from alcohol for both his pancreas and gastritis. Instructed to f/u with his PCP and/or gastroenterologist within 1-2 weeks. Given DC instructions/return precautions.    Co morbidities that complicate the patient evaluation  Past Medical History:  Diagnosis Date   Allergy    Hypertension    Pancreatitis      Medicines Meds ordered this encounter  Medications   DISCONTD: lactated ringers bolus 1,000 mL   DISCONTD: morphine (PF) 4 MG/ML injection 4 mg   DISCONTD: ondansetron (ZOFRAN) injection 4 mg   AND Linked Order Group    alum & mag hydroxide-simeth (MAALOX/MYLANTA) 200-200-20 MG/5ML suspension 30 mL    lidocaine (XYLOCAINE) 2 % viscous mouth solution 15 mL   DISCONTD: pantoprazole (PROTONIX) injection 40 mg   acetaminophen (TYLENOL) tablet 1,000 mg   ondansetron (ZOFRAN) injection 4 mg   lactated ringers bolus 1,000 mL   ondansetron (ZOFRAN-ODT) 4 MG disintegrating tablet    Sig: Take 1 tablet (4 mg total) by mouth every 8 (eight) hours as needed for nausea or vomiting.    Dispense:  20 tablet    Refill:  0   oxyCODONE (Oxy IR/ROXICODONE) immediate release tablet 5 mg   oxyCODONE (ROXICODONE) 5 MG immediate release tablet    Sig: Take 1 tablet (5 mg total) by mouth every 4 (four) hours as needed for up to 3 days for severe pain.    Dispense:  12 tablet    Refill:  0    I have reviewed the patients home medicines and have made adjustments as needed  Problem List / ED Course: Problem List Items  Addressed This Visit   None Visit Diagnoses     Acute pancreatitis, unspecified complication status, unspecified pancreatitis type    -  Primary   Relevant Medications   oxyCODONE (Oxy IR/ROXICODONE) immediate release tablet 5 mg (Completed)   Constipation, unspecified constipation type  This note was created using dictation software, which may contain spelling or grammatical errors.    Audley Hose, MD 04/17/22 [phone]

## 2022-04-14 ENCOUNTER — Other Ambulatory Visit: Payer: Self-pay | Admitting: Gastroenterology

## 2022-04-14 DIAGNOSIS — R1084 Generalized abdominal pain: Secondary | ICD-10-CM

## 2022-04-17 ENCOUNTER — Telehealth: Payer: Self-pay | Admitting: Gastroenterology

## 2022-04-17 NOTE — Telephone Encounter (Signed)
Pt scheduled to see Carl Best NP tomorrow at 1:30pm. Pt aware of appt.

## 2022-04-17 NOTE — Telephone Encounter (Signed)
Pt scheduled to see Carl Best NP tomorrow at 1:30pm.

## 2022-04-17 NOTE — Telephone Encounter (Signed)
Inbound call from patient stating that he went to the ER on 1/23 for acute pancreatitis and was advised to make a follow up appointment. Patient was scheduled to see Alonza Bogus on  2/22 at 11:00. Patient stated he is still in a lot of pain and is seeking advice if there is anyway we can proscribe a medication to help in the mean time. Please advise.

## 2022-04-18 ENCOUNTER — Ambulatory Visit: Payer: Managed Care, Other (non HMO) | Admitting: Nurse Practitioner

## 2022-04-23 ENCOUNTER — Encounter (HOSPITAL_COMMUNITY): Payer: Self-pay

## 2022-04-23 ENCOUNTER — Telehealth (HOSPITAL_COMMUNITY): Payer: Self-pay | Admitting: Family Medicine

## 2022-04-23 ENCOUNTER — Ambulatory Visit (HOSPITAL_COMMUNITY)
Admission: EM | Admit: 2022-04-23 | Discharge: 2022-04-23 | Disposition: A | Discharge status: Home / Self Care | Payer: Managed Care, Other (non HMO) | Attending: Family Medicine | Admitting: Family Medicine

## 2022-04-23 ENCOUNTER — Ambulatory Visit: Payer: Self-pay | Admitting: *Deleted

## 2022-04-23 DIAGNOSIS — R1013 Epigastric pain: Secondary | ICD-10-CM | POA: Insufficient documentation

## 2022-04-23 LAB — CBC WITH DIFFERENTIAL/PLATELET
Abs Immature Granulocytes: 0.01 10*3/uL (ref 0.00–0.07)
Basophils Absolute: 0 10*3/uL (ref 0.0–0.1)
Basophils Relative: 1 %
Eosinophils Absolute: 0 10*3/uL (ref 0.0–0.5)
Eosinophils Relative: 0 %
HCT: 38.3 % — ABNORMAL LOW (ref 39.0–52.0)
Hemoglobin: 14 g/dL (ref 13.0–17.0)
Immature Granulocytes: 0 %
Lymphocytes Relative: 32 %
Lymphs Abs: 2.2 10*3/uL (ref 0.7–4.0)
MCH: 32.9 pg (ref 26.0–34.0)
MCHC: 36.6 g/dL — ABNORMAL HIGH (ref 30.0–36.0)
MCV: 90.1 fL (ref 80.0–100.0)
Monocytes Absolute: 0.7 10*3/uL (ref 0.1–1.0)
Monocytes Relative: 10 %
Neutro Abs: 3.8 10*3/uL (ref 1.7–7.7)
Neutrophils Relative %: 57 %
Platelets: 543 10*3/uL — ABNORMAL HIGH (ref 150–400)
RBC: 4.25 MIL/uL (ref 4.22–5.81)
RDW: 13.3 % (ref 11.5–15.5)
WBC: 6.7 10*3/uL (ref 4.0–10.5)
nRBC: 0 % (ref 0.0–0.2)

## 2022-04-23 LAB — COMPREHENSIVE METABOLIC PANEL
ALT: 16 U/L (ref 0–44)
AST: 17 U/L (ref 15–41)
Albumin: 3.2 g/dL — ABNORMAL LOW (ref 3.5–5.0)
Alkaline Phosphatase: 61 U/L (ref 38–126)
Anion gap: 10 (ref 5–15)
BUN: 17 mg/dL (ref 6–20)
CO2: 24 mmol/L (ref 22–32)
Calcium: 9.1 mg/dL (ref 8.9–10.3)
Chloride: 97 mmol/L — ABNORMAL LOW (ref 98–111)
Creatinine, Ser: 1.18 mg/dL (ref 0.61–1.24)
GFR, Estimated: >60 mL/min (ref >60)
Glucose, Bld: 115 mg/dL — ABNORMAL HIGH (ref 70–99)
Potassium: 3.5 mmol/L (ref 3.5–5.1)
Sodium: 131 mmol/L — ABNORMAL LOW (ref 135–145)
Total Bilirubin: 0.2 mg/dL — ABNORMAL LOW (ref 0.3–1.2)
Total Protein: 6.3 g/dL — ABNORMAL LOW (ref 6.5–8.1)

## 2022-04-23 LAB — LIPASE, BLOOD: Lipase: 305 U/L — ABNORMAL HIGH (ref 11–51)

## 2022-04-23 MED ORDER — OXYCODONE HCL 5 MG PO TABS: 5.0000 mg | ORAL_TABLET | Freq: Four times a day (QID) | ORAL | 0 refills | Status: DC | PRN

## 2022-04-23 NOTE — Telephone Encounter (Signed)
Reason for Disposition  Caller has medicine question, adult has minor symptoms, caller declines triage, AND triager answers question    Suggested he go to the urgent care until established with his new dr. On 05/01/2022  Answer Assessment - Initial Assessment Questions 1. NAME of MEDICINE: "What medicine(s) are you calling about?"     Oxycodone 2. QUESTION: "What is your question?" (e.g., double dose of medicine, side effect)     I need a refill for my pain medicine.   (He has a new pt.  Appt with [facility] [address] 05/01/2022).  I let him know he could go to an urgent care and let them know his situation and see if they will give him a 30 day supply until you are seen by your new doctor and established.    He was agreeable to doing this. 3. PRESCRIBER: "Who prescribed the medicine?" Reason: if prescribed by specialist, call should be referred to that group.     N/A 4. SYMPTOMS: "Do you have any symptoms?" If Yes, ask: "What symptoms are you having?"  "How bad are the symptoms (e.g., mild, moderate, severe)     Still having abd pain.   (He has been seen for this) 5. PREGNANCY:  "Is there any chance that you are pregnant?" "When was your last menstrual period?"     N/A  Protocols used: Medication Question Call-A-AH

## 2022-04-23 NOTE — ED Triage Notes (Addendum)
Pt requesting 30days of oxycodone. States dx with pancreatitis on 01/23. States the [facility] RN told him to come here for his refill. States ibuprofen and tylenol with no relief. Pt c/o hurting all over. Pt has a appt with his provider next Thursday.

## 2022-04-23 NOTE — ED Provider Notes (Signed)
[facility]   [id number] 04/23/22 Arrival Time: 4098  ASSESSMENT & PLAN:  1. Epigastric pain    Persistent elevated lipase; reports pancreatitis flare over past few weeks. Has outpt f/u in a week. Will inform him of labs and call in short supply of pain medication. Recommend returning here in 48 hours for repeat labs.  Labs Reviewed  COMPREHENSIVE METABOLIC PANEL - Abnormal; Notable for the following components:      Result Value   Sodium 131 (*)    Chloride 97 (*)    Glucose, Bld 115 (*)    Total Protein 6.3 (*)    Albumin 3.2 (*)    Total Bilirubin 0.2 (*)    All other components within normal limits  LIPASE, BLOOD - Abnormal; Notable for the following components:   Lipase 305 (*)    All other components within normal limits  CBC WITH DIFFERENTIAL/PLATELET - Abnormal; Notable for the following components:   HCT 38.3 (*)    MCHC 36.6 (*)    Platelets 543 (*)    All other components within normal limits      Discharge Instructions      You have had labs (blood tests) sent today. We will call you with any significant abnormalities or if there is need to begin or change treatment or pursue further follow up. If your labs do show evidence of pancreatitis, I will call in a few days of pain medication for you. If you keep getting worse though, you will need to proceed to the Emergency Department as we discussed.      Follow-up Information     [facility].   Specialty: Emergency Medicine Why: If symptoms worsen in any way. Contact information: [address] [id number] [place] [phone]                Reviewed expectations re: course of current medical issues. Questions answered. Outlined signs and symptoms indicating need for more acute intervention. Patient verbalized understanding. After Visit Summary given.   SUBJECTIVE: History from: patient. Zachary Harper. is a 48 y.o. male who reports dx of pancreatitis on 01/23. Has been on oxycodone for pain control. Stable pain but out of pain medications. Instructed by [facility] nurse to come here for refill. No n/v/d. States ibuprofen and tylenol with no relief. Pt c/o hurting all over. Pt has a appt with his provider next Thursday. Denies recent alcohol use. Is planning on seeing GI in near future. Denies fever.   Past Surgical History:  Procedure Laterality Date   HERNIA REPAIR     NASAL FRACTURE SURGERY     TYMPANOSTOMY TUBE PLACEMENT Bilateral      OBJECTIVE:  Vitals:   04/23/22 1607  BP: (!) 146/102  Pulse: (!) 108  Resp: 18  Temp: 97.7 F (36.5 C)  TempSrc: Oral  SpO2: 99%    Recheck P: 98  General appearance: alert, oriented, no acute distress HEENT: [place]; AT; oropharynx moist Lungs: unlabored respirations Abdomen: soft; without distention; mild  TTP over upper midline abdomen/epigastric; normal bowel sounds; without masses or organomegaly; without guarding or rebound tenderness Back: without reported CVA tenderness; FROM at waist Extremities: without LE edema; symmetrical; without gross deformities Skin: warm and dry Neurologic: normal gait Psychological: alert and cooperative; normal mood and affect  Labs: Results for orders placed or performed during the hospital encounter of 04/23/22  Comprehensive metabolic panel  Result Value Ref Range  Sodium 131 (L) 135 - 145 mmol/L   Potassium 3.5 3.5 - 5.1 mmol/L   Chloride 97 (L) 98 - 111 mmol/L   CO2 24 22 - 32 mmol/L   Glucose, Bld 115 (H) 70 - 99 mg/dL   BUN 17 6 - 20 mg/dL   Creatinine, Ser 1.18 0.61 - 1.24 mg/dL   Calcium 9.1 8.9 - 10.3 mg/dL   Total Protein 6.3 (L) 6.5 - 8.1 g/dL   Albumin 3.2 (L) 3.5 - 5.0 g/dL   AST 17 15 - 41 U/L   ALT 16 0 - 44 U/L   Alkaline Phosphatase 61 38 - 126 U/L   Total Bilirubin 0.2 (L) 0.3 - 1.2 mg/dL   GFR, Estimated >60 >60 mL/min   Anion gap 10 5 - 15  Lipase, blood  Result  Value Ref Range   Lipase 305 (H) 11 - 51 U/L  CBC with Differential/Platelet  Result Value Ref Range   WBC 6.7 4.0 - 10.5 K/uL   RBC 4.25 4.22 - 5.81 MIL/uL   Hemoglobin 14.0 13.0 - 17.0 g/dL   HCT 38.3 (L) 39.0 - 52.0 %   MCV 90.1 80.0 - 100.0 fL   MCH 32.9 26.0 - 34.0 pg   MCHC 36.6 (H) 30.0 - 36.0 g/dL   RDW 13.3 11.5 - 15.5 %   Platelets 543 (H) 150 - 400 K/uL   nRBC 0.0 0.0 - 0.2 %   Neutrophils Relative % 57 %   Neutro Abs 3.8 1.7 - 7.7 K/uL   Lymphocytes Relative 32 %   Lymphs Abs 2.2 0.7 - 4.0 K/uL   Monocytes Relative 10 %   Monocytes Absolute 0.7 0.1 - 1.0 K/uL   Eosinophils Relative 0 %   Eosinophils Absolute 0.0 0.0 - 0.5 K/uL   Basophils Relative 1 %   Basophils Absolute 0.0 0.0 - 0.1 K/uL   Immature Granulocytes 0 %   Abs Immature Granulocytes 0.01 0.00 - 0.07 K/uL   Labs Reviewed  COMPREHENSIVE METABOLIC PANEL - Abnormal; Notable for the following components:      Result Value   Sodium 131 (*)    Chloride 97 (*)    Glucose, Bld 115 (*)    Total Protein 6.3 (*)    Albumin 3.2 (*)    Total Bilirubin 0.2 (*)    All other components within normal limits  LIPASE, BLOOD - Abnormal; Notable for the following components:   Lipase 305 (*)    All other components within normal limits  CBC WITH DIFFERENTIAL/PLATELET - Abnormal; Notable for the following components:   HCT 38.3 (*)    MCHC 36.6 (*)    Platelets 543 (*)    All other components within normal limits    Imaging: No results found.   No Known Allergies                                             Past Medical History:  Diagnosis Date   Allergy    Hypertension    Pancreatitis     Social History   Socioeconomic History   Marital status: Single    Spouse name: Not on file   Number of children: 2   Years of education: Not on file   Highest education level: Bachelor's degree (e.g., BA, AB, BS)  Occupational History   Occupation: Unemployed  Tobacco Use  Smoking status: Every Day    Types:  Cigars   Smokeless tobacco: Never   Tobacco comments:    Smokes the "little" cigars that look like cigarettes  Vaping Use   Vaping Use: Never used  Substance and Sexual Activity   Alcohol use: Not Currently    Alcohol/week: 12.0 - 24.0 standard drinks of alcohol    Types: 12 - 24 Shots of liquor per week    Comment: daily; (08/22/11-reports drinking a fifth in last week, had not drank since getting out of hospital in april   Drug use: Yes    Types: Marijuana   Sexual activity: Not Currently    Partners: Female    Comment: Last intercourse was 2 years ago  Other Topics Concern   Not on file  Social History Narrative   Not on file   Social Determinants of Health   Financial Resource Strain: High Risk (06/01/2018)   Overall Financial Resource Strain (CARDIA)    Difficulty of Paying Living Expenses: Very hard  Food Insecurity: Food Insecurity Present (06/01/2018)   Hunger Vital Sign    Worried About Running Out of Food in the Last Year: Often true    Ran Out of Food in the Last Year: Often true  Transportation Needs: Unmet Transportation Needs (06/01/2018)   PRAPARE - Hydrologist (Medical): Yes    Lack of Transportation (Non-Medical): Yes  Physical Activity: Insufficiently Active (06/01/2018)   Exercise Vital Sign    Days of Exercise per Week: 3 days    Minutes of Exercise per Session: 10 min  Stress: No Stress Concern Present (06/01/2018)   [place]    Feeling of Stress : Not at all  Social Connections: Moderately Isolated (06/01/2018)   Social Connection and Isolation Panel [NHANES]    Frequency of Communication with Friends and Family: More than three times a week    Frequency of Social Gatherings with Friends and Family: More than three times a week    Attends Religious Services: Never    Marine scientist or Organizations: No    Attends Archivist Meetings: Never     Marital Status: Never married  Intimate Partner Violence: Not At Risk (06/01/2018)   Humiliation, Afraid, Rape, and Kick questionnaire    Fear of Current or Ex-Partner: No    Emotionally Abused: No    Physically Abused: No    Sexually Abused: No    Family History  Problem Relation Age of Onset   Hypertension Father    Diabetes Father    Other Sister        Leg Issues   Depression Sister    Hypertension Paternal Grandmother    Asthma Daughter    Asthma Son    Colon cancer Neg Hx    Pancreatic cancer Neg Hx    Esophageal cancer Neg Hx    Liver cancer Neg Hx    Stomach cancer Neg Hx    Rectal cancer Neg Hx      Vanessa Kick, MD 04/23/22 1757

## 2022-04-23 NOTE — Telephone Encounter (Signed)
Results for orders placed or performed during the hospital encounter of 04/23/22  Comprehensive metabolic panel  Result Value Ref Range   Sodium 131 (L) 135 - 145 mmol/L   Potassium 3.5 3.5 - 5.1 mmol/L   Chloride 97 (L) 98 - 111 mmol/L   CO2 24 22 - 32 mmol/L   Glucose, Bld 115 (H) 70 - 99 mg/dL   BUN 17 6 - 20 mg/dL   Creatinine, Ser 1.18 0.61 - 1.24 mg/dL   Calcium 9.1 8.9 - 10.3 mg/dL   Total Protein 6.3 (L) 6.5 - 8.1 g/dL   Albumin 3.2 (L) 3.5 - 5.0 g/dL   AST 17 15 - 41 U/L   ALT 16 0 - 44 U/L   Alkaline Phosphatase 61 38 - 126 U/L   Total Bilirubin 0.2 (L) 0.3 - 1.2 mg/dL   GFR, Estimated >60 >60 mL/min   Anion gap 10 5 - 15  Lipase, blood  Result Value Ref Range   Lipase 305 (H) 11 - 51 U/L  CBC with Differential/Platelet  Result Value Ref Range   WBC 6.7 4.0 - 10.5 K/uL   RBC 4.25 4.22 - 5.81 MIL/uL   Hemoglobin 14.0 13.0 - 17.0 g/dL   HCT 38.3 (L) 39.0 - 52.0 %   MCV 90.1 80.0 - 100.0 fL   MCH 32.9 26.0 - 34.0 pg   MCHC 36.6 (H) 30.0 - 36.0 g/dL   RDW 13.3 11.5 - 15.5 %   Platelets 543 (H) 150 - 400 K/uL   nRBC 0.0 0.0 - 0.2 %   Neutrophils Relative % 57 %   Neutro Abs 3.8 1.7 - 7.7 K/uL   Lymphocytes Relative 32 %   Lymphs Abs 2.2 0.7 - 4.0 K/uL   Monocytes Relative 10 %   Monocytes Absolute 0.7 0.1 - 1.0 K/uL   Eosinophils Relative 0 %   Eosinophils Absolute 0.0 0.0 - 0.5 K/uL   Basophils Relative 1 %   Basophils Absolute 0.0 0.0 - 0.1 K/uL   Immature Granulocytes 0 %   Abs Immature Granulocytes 0.01 0.00 - 0.07 K/uL    Pt informed of labs. Recommend rechecking labs in 48 hours, esp sodium. Lipase stable. He agrees.   Meds ordered this encounter  Medications   oxyCODONE (ROXICODONE) 5 MG immediate release tablet    Sig: Take 1 tablet (5 mg total) by mouth every 6 (six) hours as needed for severe pain.    Dispense:  12 tablet    Refill:  0

## 2022-04-23 NOTE — Telephone Encounter (Signed)
  Chief Complaint: Needing a refill on his oxycodone before seeing new dr. To be established.  Symptoms: For abd pain which he has been seen for. Frequency: N/A Pertinent Negatives: Patient denies N/A Disposition: '[]'$ ED /'[x]'$ Urgent Care (no appt availability in office) / '[]'$ Appointment(In office/virtual)/ '[]'$  [facility]/ '[]'$ Home Care/ '[]'$ Refused Recommended Disposition /'[]'$ [facility]/ '[]'$  Follow-up with PCP Additional Notes: I suggested he go to the urgent care.  He was agreeable to this plan.

## 2022-04-23 NOTE — Discharge Instructions (Addendum)
You have had labs (blood tests) sent today. We will call you with any significant abnormalities or if there is need to begin or change treatment or pursue further follow up. If your labs do show evidence of pancreatitis, I will call in a few days of pain medication for you. If you keep getting worse though, you will need to proceed to the Emergency Department as we discussed.

## 2022-04-25 ENCOUNTER — Ambulatory Visit (HOSPITAL_COMMUNITY): Payer: Managed Care, Other (non HMO)

## 2022-05-01 ENCOUNTER — Encounter: Payer: Self-pay | Admitting: Family

## 2022-05-01 ENCOUNTER — Ambulatory Visit: Payer: Managed Care, Other (non HMO) | Admitting: Family

## 2022-05-01 VITALS — BP 136/93 | HR 98 | Temp 97.8°F | Ht 70.5 in | Wt 106.4 lb

## 2022-05-01 DIAGNOSIS — S025XXA Fracture of tooth (traumatic), initial encounter for closed fracture: Secondary | ICD-10-CM | POA: Diagnosis not present

## 2022-05-01 DIAGNOSIS — F17200 Nicotine dependence, unspecified, uncomplicated: Secondary | ICD-10-CM | POA: Insufficient documentation

## 2022-05-01 DIAGNOSIS — K861 Other chronic pancreatitis: Secondary | ICD-10-CM

## 2022-05-01 DIAGNOSIS — F17298 Nicotine dependence, other tobacco product, with other nicotine-induced disorders: Secondary | ICD-10-CM | POA: Diagnosis not present

## 2022-05-01 DIAGNOSIS — R636 Underweight: Secondary | ICD-10-CM | POA: Diagnosis not present

## 2022-05-01 MED ORDER — OXYCODONE HCL 5 MG PO TABS: 5.0000 mg | ORAL_TABLET | Freq: Four times a day (QID) | ORAL | 0 refills | Status: DC | PRN

## 2022-05-01 MED ORDER — ONDANSETRON 4 MG PO TBDP: 4.0000 mg | ORAL_TABLET | Freq: Three times a day (TID) | ORAL | 0 refills | Status: AC | PRN

## 2022-05-01 NOTE — Progress Notes (Signed)
New Patient Office Visit  Subjective:  Patient ID: Zachary Harper., male    DOB: [birth year]  Age: 47 y.o. MRN: [id number]  CC:  Chief Complaint  Patient presents with   New Patient (Initial Visit)   Follow-up    Constipation, dehydration, fatigue and low sodium, diagnosed with pancreatitis on 04/23/22. Pt would like refill of the oxycodone.     HPI Worthy Kain. presents for establishing care today.  Pancreatitis:  per review of pt EMR & pt account, he was seen in UC  02/2022 with sx, but not treated for pancreatitis, given abt for? no labs done pt c/o of coffee ground emesis, stomach pain & constipation. He reports hx of pancreatitis in 2013 & 2015 due to excessive alcohol use. Reports he did continue to drink but not as much but then around 01/2022 he stopped drinking completely because he started having the stomach pain again. Seen again in ER on 1/15 for sx but left AMA due to long wait, did not get lab results, returned to ER on 1/23 due to continued pain, no appetite, N,V - dx w/pancreatitis, given OXY, protonix, senna laxative. Pt returned again on 2/7 to UC d/t continued pain, Lipase elevated with each visit since 1/15. Today he reports overall feeling a little better, but still having abd pain & nausea, little appetite. 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  Assessment & Plan:   Problem List Items Addressed This Visit       Digestive   Chronic pancreatitis ([place]) - Primary    reports first episode 2013, then 2015, recently in 02/2022 w/same sx but treated for gastritis, then ER again in 03/2022 also w/hyponatremia stomach pain & nausea persist, no appetite, underweight pt reports no alcohol since 01/2022 sending refill of Oxy, advised this is last RX, refilling Zofran f/u 1 mo      Relevant Medications   ondansetron (ZOFRAN-ODT) 4 MG disintegrating tablet   oxyCODONE (ROXICODONE) 5 MG  immediate release tablet     Other   Nicotine dependence    chronic > 20 yrs 1/2-1ppd no hx of quitting advised this is appetite suppressant discussed generic Chantix, nicotine replacement, harms of smoking to overall health will discuss further in f/u visit      Underweight due to inadequate caloric intake    chronic pt w/hx of alcohol abuse, currently smoking discussed high calories foods, eating mini meals through the day may refer to nutritionist if unable to gain after current pancreatitis episode      Other Visit Diagnoses     Broken teeth       Relevant Orders   Ambulatory referral to Dentistry      Problem List Items Addressed This Visit       Digestive   Chronic pancreatitis ([facility]) - Primary    reports first episode 2013, then 2015, recently in 02/2022 w/same sx but treated for gastritis, then ER again in 03/2022 also w/hyponatremia stomach pain & nausea persist, no appetite, underweight pt reports no alcohol since 01/2022 sending refill of Oxy, advised this is last RX, refilling Zofran f/u 1 mo      Relevant Medications   ondansetron (ZOFRAN-ODT) 4 MG disintegrating tablet   oxyCODONE (ROXICODONE) 5 MG immediate release tablet     Other   Nicotine dependence    chronic > 20 yrs 1/2-1ppd no hx of quitting advised this is appetite suppressant discussed  generic Chantix, nicotine replacement, harms of smoking to overall health will discuss further in f/u visit      Underweight due to inadequate caloric intake    chronic pt w/hx of alcohol abuse, currently smoking discussed high calories foods, eating mini meals through the day may refer to nutritionist if unable to gain after current pancreatitis episode      Other Visit Diagnoses     Broken teeth       Relevant Orders   Ambulatory referral to Dentistry      Subjective:    Outpatient Medications Prior to Visit  Medication Sig Dispense Refill   pantoprazole (PROTONIX) 40 MG tablet TAKE 1  TABLET(40 MG) BY MOUTH DAILY 90 tablet 3   senna (SENOKOT) 8.6 MG TABS tablet TAKE 1 TABLET BY MOUTH DAILY AS NEEDED FOR MILD CONSTIPATION 120 tablet 0   diphenhydrAMINE (BENADRYL) 25 MG tablet Take 25 mg by mouth every 6 (six) hours as needed.     Ibuprofen-diphenhydrAMINE HCl 200-25 MG CAPS Take by mouth at bedtime as needed.     ondansetron (ZOFRAN-ODT) 4 MG disintegrating tablet Take 1 tablet (4 mg total) by mouth every 8 (eight) hours as needed for nausea or vomiting. 20 tablet 0   oxyCODONE (ROXICODONE) 5 MG immediate release tablet Take 1 tablet (5 mg total) by mouth every 6 (six) hours as needed for severe pain. 12 tablet 0   No facility-administered medications prior to visit.   Past Medical History:  Diagnosis Date   Allergy    Dermatitis 07/31/2020   Enlarged lymph nodes 05/23/2020   History of pancreatitis 06/01/2018   Hypertension    Loss of weight 05/23/2020   Pancreatitis    Right groin mass 05/23/2020   Past Surgical History:  Procedure Laterality Date   HERNIA REPAIR     NASAL FRACTURE SURGERY     TYMPANOSTOMY TUBE PLACEMENT Bilateral     Objective:   Today's Vitals: BP (!) 136/93   Pulse 98   Temp 97.8 F (36.6 C) (Temporal)   Ht 5' 10.5" (1.791 m)   Wt 106 lb 6 oz (48.3 kg)   SpO2 99%   BMI 15.05 kg/m   Physical Exam Vitals and nursing note reviewed.  Constitutional:      General: He is not in acute distress.    Appearance: Normal appearance. He is underweight.  HENT:     Head: Normocephalic.  Cardiovascular:     Rate and Rhythm: Normal rate and regular rhythm.  Pulmonary:     Effort: Pulmonary effort is normal.     Breath sounds: Normal breath sounds.  Musculoskeletal:        General: Normal range of motion.     Cervical back: Normal range of motion.  Skin:    General: Skin is warm and dry.  Neurological:     Mental Status: He is alert and oriented to person, place, and time.  Psychiatric:        Mood and Affect: Mood normal.     Meds  ordered this encounter  Medications   ondansetron (ZOFRAN-ODT) 4 MG disintegrating tablet    Sig: Take 1 tablet (4 mg total) by mouth every 8 (eight) hours as needed for nausea or vomiting.    Dispense:  20 tablet    Refill:  0   oxyCODONE (ROXICODONE) 5 MG immediate release tablet    Sig: Take 1 tablet (5 mg total) by mouth every 6 (six) hours as needed for severe pain.  Dispense:  12 tablet    Refill:  0    Jeanie Sewer, NP

## 2022-05-01 NOTE — Patient Instructions (Addendum)
Welcome to [facility], It was a pleasure meeting you today!    As discussed, I have sent your refills to your pharmacy.  I have sent a referral over to a Dentist office, they will call you directly.  Please schedule a 1 month follow up visit today.      PLEASE NOTE: If you had any LAB tests please let us know if you have not heard back within a few days. You may see your results on MyChart before we have a chance to review them but we will give you a call once they are reviewed by [place]. If we ordered any REFERRALS today, please let us know if you have not heard from their office within the next week.  Let us know through MyChart if you are needing REFILLS, or have your pharmacy send [place] the request. You can also use MyChart to communicate with me or any office staff.

## 2022-05-03 ENCOUNTER — Encounter: Payer: Self-pay | Admitting: Family

## 2022-05-03 NOTE — Assessment & Plan Note (Signed)
chronic pt w/hx of alcohol abuse, currently smoking discussed high calories foods, eating mini meals through the day may refer to nutritionist if unable to gain after current pancreatitis episode

## 2022-05-03 NOTE — Assessment & Plan Note (Signed)
reports first episode 2013, then 2015, recently in 02/2022 w/same sx but treated for gastritis, then ER again in 03/2022 also w/hyponatremia stomach pain & nausea persist, no appetite, underweight pt reports no alcohol since 01/2022 sending refill of Oxy, advised this is last RX, refilling Zofran f/u 1 mo

## 2022-05-03 NOTE — Assessment & Plan Note (Signed)
chronic > 20 yrs 1/2-1ppd no hx of quitting advised this is appetite suppressant discussed generic Chantix, nicotine replacement, harms of smoking to overall health will discuss further in f/u visit

## 2022-05-08 ENCOUNTER — Ambulatory Visit: Payer: Managed Care, Other (non HMO) | Admitting: Gastroenterology

## 2022-05-11 NOTE — Progress Notes (Deleted)
05/11/2022 Zachary Harper [id number] June 11, 1975   Chief Complaint:  History of Present Illness: Zachary Harper is a 47 year old male with a past medical history of hypertension, hepatic steatosis, alcohol use disorder with associated chronic pancreatitis. S/P right inguinal hernia surgery 11/2020. He was initially seen in office by Dr. Candis Schatz 12/04/2020 due to having abdominal pain, constipation and weight loss. He was scheduled for a chest CT, EGD and colonoscopy.   He reports having a colonoscopy in 2013 to evaluate abdominal pain.  He states that this was normal.   He does not think he is ever had an upper endoscopy.  In May 2022 he was found to have a positive H. Pylori serology.  He was prescribed antibiotics but he isn't sure he took them correctly.  He states that after he took the antibiotics he felt better for a while, but then his symptoms returned.    No fam hx of CRC or GI malignancy  He presented to the ED 04/08/2022 with epigastric and LUQ pain x 1 1/2 months. No alcohol since 01/2022. Lipase 220.   He presented to the ED 04/23/2022. Lipase 305.      Latest Ref Rng & Units 04/23/2022    4:26 PM 04/08/2022   12:03 PM 03/31/2022    1:05 AM  CBC  WBC 4.0 - 10.5 K/uL 6.7  6.6  6.9   Hemoglobin 13.0 - 17.0 g/dL 14.0  14.1  12.4   Hematocrit 39.0 - 52.0 % 38.3  39.2  34.6   Platelets 150 - 400 K/uL 543  537  525        Latest Ref Rng & Units 04/23/2022    4:26 PM 04/08/2022   12:03 PM 03/31/2022    1:05 AM  CMP  Glucose 70 - 99 mg/dL 115  97  107   BUN 6 - 20 mg/dL '17  11  21   '$ Creatinine 0.61 - 1.24 mg/dL 1.18  1.11  1.60   Sodium 135 - 145 mmol/L 131  136  134   Potassium 3.5 - 5.1 mmol/L 3.5  3.8  3.4   Chloride 98 - 111 mmol/L 97  102  100   CO2 22 - 32 mmol/L '24  27  24   '$ Calcium 8.9 - 10.3 mg/dL 9.1  9.3  9.0   Total Protein 6.5 - 8.1 g/dL 6.3  6.4  5.9   Total Bilirubin 0.3 - 1.2 mg/dL 0.2  0.4  0.6   Alkaline Phos 38 - 126 U/L 61  61  51   AST  15 - 41 U/L '17  11  15   '$ ALT 0 - 44 U/L '16  11  14    '$ Triglyceride level 72 on 05/23/2020  CTAP 06/20/2020: 1. New indeterminate low-density right inguinal mass, without definite communication with the peritoneal cavity or associated distal small bowel obstruction. The administered enteric contrast has not passed beyond this level into the terminal ileum or colon. This does not have the typical appearance of an inguinal hernia containing small bowel and may reflect a loculated hydrocele or other atypical fluid collection. Consider further evaluation with ultrasound. Clinical follow-up necessary to exclude atypical incarcerated hernia. 2. 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 3. These results will be called to the ordering clinician or representative by the Radiology Department at the imaging location.  EGD 04/05/2021:  Colonoscopy 04/05/2021:   7 year colonoscopy recall  Diagnosis 1. Surgical [P], gastric antrum  and gastric body - ANTRAL AND OXYNTIC MUCOSA WITH SLIGHT HYPEREMIA. - NO HELICOBACTER PYLORI IDENTIFIED. 2. Surgical [P], mid esophagus and distal esophagus - GASTROESOPHAGEAL MUCOSA WITH SLIGHT INFLAMMATION CONSISTENT WITH REFLUX. 3. 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 4. Surgical [P], colon, rectum - COLONIC MUCOSA WITH HYPEREMIA. - NO ACTIVE INFLAMMATION, CHRONIC CHANGES OR GRANULOMAS. Current Medications, Allergies, Past Medical History, Past Surgical History, Family History and Social History were reviewed in [employer] record.   Review of Systems:   Constitutional: Negative for fever, sweats, chills or weight loss.  Respiratory: Negative for shortness of breath.   Cardiovascular: Negative for chest pain, palpitations and leg swelling.  Gastrointestinal: See HPI.  Musculoskeletal: Negative for back pain or muscle aches.  Neurological: Negative for dizziness, headaches or  paresthesias.    Physical Exam: There were no vitals taken for this visit. General: in no acute distress. Head: Normocephalic and atraumatic. Eyes: No scleral icterus. Conjunctiva pink . Ears: Normal auditory acuity. Mouth: Dentition intact. No ulcers or lesions.  Lungs: Clear throughout to auscultation. Heart: Regular rate and rhythm, no murmur. Abdomen: Soft, nontender and nondistended. No masses or hepatomegaly. Normal bowel sounds x 4 quadrants.  Rectal: *** Musculoskeletal: Symmetrical with no gross deformities. Extremities: No edema. Neurological: Alert oriented x 4. No focal deficits.  Psychological: Alert and cooperative. Normal mood and affect  Assessment and Recommendations: ***

## 2022-05-12 ENCOUNTER — Telehealth: Payer: Self-pay

## 2022-05-12 ENCOUNTER — Ambulatory Visit: Payer: Managed Care, Other (non HMO) | Admitting: Nurse Practitioner

## 2022-05-12 NOTE — Telephone Encounter (Signed)
-----   Message from Jeanie Sewer, NP sent at 05/10/2022  8:17 AM EST ----- Regarding: Dentist Please call and let Keefer know we no longer have a dentist thru [facility], he unfortunately just has to call around to find one on his own.  Thanks.

## 2022-05-12 NOTE — Telephone Encounter (Signed)
I called pt to let him know, pt gave a verbalized understanding.

## 2022-05-19 ENCOUNTER — Encounter: Payer: Self-pay | Admitting: Pharmacist

## 2022-05-29 ENCOUNTER — Ambulatory Visit (INDEPENDENT_AMBULATORY_CARE_PROVIDER_SITE_OTHER): Payer: Managed Care, Other (non HMO) | Admitting: Family

## 2022-05-29 ENCOUNTER — Encounter: Payer: Self-pay | Admitting: Family

## 2022-05-29 VITALS — BP 133/88 | HR 100 | Temp 97.7°F | Ht 70.5 in | Wt 103.0 lb

## 2022-05-29 DIAGNOSIS — F17298 Nicotine dependence, other tobacco product, with other nicotine-induced disorders: Secondary | ICD-10-CM

## 2022-05-29 DIAGNOSIS — K861 Other chronic pancreatitis: Secondary | ICD-10-CM

## 2022-05-29 DIAGNOSIS — K921 Melena: Secondary | ICD-10-CM | POA: Diagnosis not present

## 2022-05-29 LAB — COMPREHENSIVE METABOLIC PANEL
ALT: 10 U/L (ref 0–53)
AST: 9 U/L (ref 0–37)
Albumin: 3.3 g/dL — ABNORMAL LOW (ref 3.5–5.2)
Alkaline Phosphatase: 62 U/L (ref 39–117)
BUN: 24 mg/dL — ABNORMAL HIGH (ref 6–23)
CO2: 27 mEq/L (ref 19–32)
Calcium: 9.3 mg/dL (ref 8.4–10.5)
Chloride: 106 mEq/L (ref 96–112)
Creatinine, Ser: 1.08 mg/dL (ref 0.40–1.50)
GFR: 81.7 mL/min (ref >60.00)
Glucose, Bld: 93 mg/dL (ref 70–99)
Potassium: 4.3 mEq/L (ref 3.5–5.1)
Sodium: 139 mEq/L (ref 135–145)
Total Bilirubin: 0.3 mg/dL (ref 0.2–1.2)
Total Protein: 6.5 g/dL (ref 6.0–8.3)

## 2022-05-29 MED ORDER — METHOCARBAMOL 500 MG PO TABS: 500.0000 mg | ORAL_TABLET | Freq: Four times a day (QID) | ORAL | 0 refills | Status: DC

## 2022-05-29 NOTE — Progress Notes (Signed)
Patient ID: Truett Mainland., male    DOB: 05/08/1975, 47 y.o.   MRN: [id number]  Chief Complaint  Patient presents with   Rectal Bleeding    Pt c/o still having pain. July 08, 2022 for GI    HPI: Nicotine addiction:  has smoked for more than 20 years, 1/2-1ppd, used to smoke cigarettes, now smokes skinny cigars, has not tried medications in past.   Bloody stools:  reports having blood mixed in his stool a few times, reports having some constipation, denies any hemorrhoids, rectal pain or itching.  Pancreatitis:   HX per last visit 05/01/2022 - per review of pt EMR & pt account, he was seen in UC  02/2022 with sx, but not treated for pancreatitis, given abt for? no labs done pt c/o of coffee ground emesis, stomach pain & constipation. He reports hx of pancreatitis in 2013 & 2015 due to excessive alcohol use. Reports he did continue to drink but not as much but then around 01/2022 he stopped drinking completely because he started having the stomach pain again. Seen again in ER on 1/15 for sx but left AMA due to long wait, did not get lab results, returned to ER on 1/23 due to continued pain, no appetite, N,V - dx w/pancreatitis, given OXY, protonix, senna laxative. Pt returned again on 2/7 to UC d/t continued pain, Lipase elevated with each visit since 1/15. Today he reports overall feeling a little better, but still having abd pain & nausea, little appetite.   Assessment & Plan:  Passage of bloody stools -  advised pt hid PPI qd can contribute to constipation, advised on proper daily water intake, must have soft BM qd, ok to take 1-2 OTC generic stool softenors qd, reduce to qod if stools too loose.  Chronic pancreatitis, unspecified pancreatitis type [facility]) Assessment & Plan: reports first episode 2013, then 2015, recently in 02/2022 w/same sx but treated for gastritis, then ER again in 03/2022 stomach pain & nausea persist, no appetite, underweight pt reports no alcohol since  01/2022 advised no more refills of OXY, will send Robaxin, advised on use & SE f/u 2 mo  Orders: -     Comprehensive metabolic panel -     Methocarbamol; Take 1 tablet (500 mg total) by mouth 4 (four) times daily.  Dispense: 60 tablet; Refill: 0  Other tobacco product nicotine dependence with other nicotine-induced disorder Assessment & Plan: chronic > 20 yrs 1/2-1ppd, smoking skinny cigars & advised 1 cigar = 1/2 pk of cigs no hx of quitting advised pt again this is appetite suppressant, and his weight keeps dropping discussed meds again, handout provided f/u prn   Subjective:    Outpatient Medications Prior to Visit  Medication Sig Dispense Refill   ondansetron (ZOFRAN-ODT) 4 MG disintegrating tablet Take 1 tablet (4 mg total) by mouth every 8 (eight) hours as needed for nausea or vomiting. 20 tablet 0   pantoprazole (PROTONIX) 40 MG tablet TAKE 1 TABLET(40 MG) BY MOUTH DAILY 90 tablet 3   senna (SENOKOT) 8.6 MG TABS tablet TAKE 1 TABLET BY MOUTH DAILY AS NEEDED FOR MILD CONSTIPATION 120 tablet 0   oxyCODONE (ROXICODONE) 5 MG immediate release tablet Take 1 tablet (5 mg total) by mouth every 6 (six) hours as needed for severe pain. 12 tablet 0   No facility-administered medications prior to visit.   Past Medical History:  Diagnosis Date   Allergy    Dermatitis 07/31/2020   Enlarged lymph nodes 05/23/2020  Generalized abdominal pain 07/31/2020   History of pancreatitis 06/01/2018   Hypertension    Loss of weight 05/23/2020   Pancreatitis    Right groin mass 05/23/2020   Past Surgical History:  Procedure Laterality Date   HERNIA REPAIR     NASAL FRACTURE SURGERY     TYMPANOSTOMY TUBE PLACEMENT Bilateral    No Known Allergies    Objective:    Physical Exam Vitals and nursing note reviewed.  Constitutional:      General: He is not in acute distress.    Appearance: Normal appearance.  HENT:     Head: Normocephalic.  Cardiovascular:     Rate and Rhythm: Normal  rate and regular rhythm.  Pulmonary:     Effort: Pulmonary effort is normal.     Breath sounds: Normal breath sounds.  Musculoskeletal:        General: Normal range of motion.     Cervical back: Normal range of motion.  Skin:    General: Skin is warm and dry.  Neurological:     Mental Status: He is alert and oriented to person, place, and time.  Psychiatric:        Mood and Affect: Mood normal.    BP 133/88 (BP Location: Left Arm, Patient Position: Sitting, Cuff Size: Normal)   Pulse 100   Temp 97.7 F (36.5 C) (Temporal)   Ht 5' 10.5" (1.791 m)   Wt 103 lb (46.7 kg)   SpO2 92%   BMI 14.57 kg/m  Wt Readings from Last 3 Encounters:  05/29/22 103 lb (46.7 kg)  05/01/22 106 lb 6 oz (48.3 kg)  04/08/22 110 lb (49.9 kg)      Jeanie Sewer, NP

## 2022-05-29 NOTE — Patient Instructions (Addendum)
It was very nice to see you today!   I am sending Methocarbamol, a muscle relaxer to your pharmacy to help with the continued stomach pain you are having. Take 1 pill and if no drowsiness, ok to take 2 pills at a time. Be careful if driving.  For your bowels, ok to start taking a generic, store brand stool softenor daily. You have to drink at least 8 cups of water daily for this to work well. Can increase to 2 pills daily or 1 pill twice a day if needed to help ensure you have a soft BM daily.  STOP SMOKING! If you need me prescribe Chantix or Nicotine replacement let me know. This is an appetite suppressant also! And you need more calories. Let me know if I can send a nutrition referral for you.    PLEASE NOTE:  If you had any lab tests please let us know if you have not heard back within a few days. You may see your results on MyChart before we have a chance to review them but we will give you a call once they are reviewed by [place]. If we ordered any referrals today, please let us know if you have not heard from their office within the next week.

## 2022-06-01 ENCOUNTER — Encounter: Payer: Self-pay | Admitting: Family

## 2022-06-01 NOTE — Assessment & Plan Note (Signed)
reports first episode 2013, then 2015, recently in 02/2022 w/same sx but treated for gastritis, then ER again in 03/2022 stomach pain & nausea persist, no appetite, underweight pt reports no alcohol since 01/2022 advised no more refills of OXY, will send Robaxin, advised on use & SE f/u 2 mo

## 2022-06-01 NOTE — Assessment & Plan Note (Signed)
chronic > 20 yrs 1/2-1ppd, smoking skinny cigars & advised 1 cigar = 1/2 pk of cigs no hx of quitting advised pt again this is appetite suppressant, and his weight keeps dropping discussed meds again, handout provided f/u prn

## 2022-07-04 ENCOUNTER — Other Ambulatory Visit: Payer: Self-pay | Admitting: Family

## 2022-07-04 DIAGNOSIS — K861 Other chronic pancreatitis: Secondary | ICD-10-CM

## 2022-07-04 NOTE — Progress Notes (Unsigned)
07/08/2022 Zachary Harper [id number] May 17, 1975  Referring provider: Dulce Sellar, NP Primary GI doctor: Dr. Tomasa Rand  ASSESSMENT AND PLAN:   Rectal bleeding with history of chronic idiopathic constipation Unremarkable colon 03/2021 recall 7 years From description and physical exam most likely this represents rectal fissure Patient given information, can do sitz bath will do trial of Linzess 145 to help improve constipation.  Can increase to 290.  History of acute pancreatitis/chronic pancreatitis, recurrent abdominal pain, continuing weight loss thought to be alcohol related, last alcohol consumption 01/2022 per patient, consider PETH next OV Continues to smoke cigars, counseled on smoking cessation/marijuana cessation. With significant continuing weight loss will repeat CT abdomen and pelvis with contrast to evaluate for complications of pancreatitis such as pseudocyst versus masses/CA. With continuing acute abdominal pain will check lipase, triglycerides, IgG and ANA. Pending results may want to consider further imaging versus endoscopic evaluation Increase water and do low fat small meals.  Will do trial of lyrica 50 mg TID for chronic pain, can increase dose if beneficial Continue Protonix once daily We will do close follow-up 2 to 3 months with Dr. Tomasa Rand  mild protein calorie malnutrition Albumin 05/29/2022  3.3  BMI body mass index is 14.12 kg/m.  Secondary to  chronic pancreatitis Count calories, increase protein Add on Ensure 2-3 times daily    Patient Care Team: Dulce Sellar, NP as PCP - General (Family Medicine)  HISTORY OF PRESENT ILLNESS: 47 y.o. male with a past medical history of hypertension, history of alcohol use, chronic pancreatitis, history of hernia repair and others listed below presents for evaluation of blood in stool, constipation.   06/20/2020 CT abdomen pelvis with contrast showed normal liver, no gallstones no  biliary dilatation, pancreas with multiple calcifications throughout, chronic ductal dilatation consistent with chronic calcified pancreatitis no surrounding fluid collections, normal spleen new indeterminate low-density right inguinal mass 07/05/2020 pelvis limited due to inguinal mass shows complex cystic mass right inguinal region Scrotal ultrasound showed small to moderate left and trace right hydroceles no significant fluid along right inguinal canal 12/07/2020 CT chest with contrast due to weight loss and abdominal pain showed no acute cardiopulmonary disease 04/05/2021 EGD and colonoscopy with Dr. Tomasa Rand to evaluate for abdominal pain, unintentional weight loss, good bowel prep Colonoscopy showed diverticula, 3 mm polyp cecum localized mild inflammation rectum, TI normal, anal papula hypertrophied pathology showed tubular adenomatous polyp, recall 7 years, otherwise benign pathology Endoscopy showed single island of salmon-colored mucosa biopsied negative for Barrett's, granular gastric mucosa negative for H. pylori, normal duodenum  04/08/2022 [facility] with epigastric abdominal pain for 2 months with radiation to his back, constipation.   During ER WBC 6.6, hemoglobin 14, lipase 220, unremarkable liver function Given pain medications, Protonix, Zofran 04/23/2022 return to the ER with continuing epigastric pain and wanted refill of pain medication.  Lipase 305 Given oxycodone 5 mg #12, would last 8 days.   Patient has history of pancreatitis, first episode 2013 that again 2015 now with the symptoms beginning again 02/2022.   He states in March 5-7 th during trip to [place], he had BRB blood on the TP and in the toliet. This resolved and then he notived blood a week ago again.  Has had some rectal pain.  He has been having constipation, he is passing gas.  HE has had upper AB pain with radiation to his back, left flank and can go into his chest, will prevent him from working, worse last  few days. Has nausea, vomiting Sunday, bilious vomiting.  Possible subjective fever, no chills.  Has had okay appetite, trying to eat small frequent meals but due to pain going to go with clear liquid for a few days.  He has not had ETOH 01/2022.  Smokes plastic tip cigars 1-2 a day.  He noticed blood in his stool a week ago.  THC 3 weeks ago, and has been taking OTC gummies CBD.  He takes ibuprofen daily over the weekend due to AB pain, helps pain some.  Weight continues to decrease, he is currently at 98 lbs, down from 120.   Wt Readings from Last 10 Encounters:  07/08/22 98 lb 6.4 oz (44.6 kg)  05/29/22 103 lb (46.7 kg)  05/01/22 106 lb 6 oz (48.3 kg)  04/08/22 110 lb (49.9 kg)  03/31/22 110 lb (49.9 kg)  04/05/21 110 lb (49.9 kg)  03/29/21 110 lb (49.9 kg)  12/04/20 101 lb (45.8 kg)  07/30/20 115 lb (52.2 kg)  05/22/20 114 lb (51.7 kg)     He  reports that he has been smoking cigars. He has never used smokeless tobacco. He reports that he does not currently use alcohol after a past usage of about 12.0 - 24.0 standard drinks of alcohol per week. He reports current drug use. Drug: Marijuana.  RELEVANT LABS AND IMAGING: CBC    Component Value Date/Time   WBC 6.7 04/23/2022 1626   RBC 4.25 04/23/2022 1626   HGB 14.0 04/23/2022 1626   HGB 14.1 05/23/2020 0926   HCT 38.3 (L) 04/23/2022 1626   HCT 40.2 05/23/2020 0926   PLT 543 (H) 04/23/2022 1626   PLT 464 (H) 05/23/2020 0926   MCV 90.1 04/23/2022 1626   MCV 91 05/23/2020 0926   MCH 32.9 04/23/2022 1626   MCHC 36.6 (H) 04/23/2022 1626   RDW 13.3 04/23/2022 1626   RDW 12.3 05/23/2020 0926   LYMPHSABS 2.2 04/23/2022 1626   LYMPHSABS 2.7 05/23/2020 0926   MONOABS 0.7 04/23/2022 1626   EOSABS 0.0 04/23/2022 1626   EOSABS 0.4 05/23/2020 0926   BASOSABS 0.0 04/23/2022 1626   BASOSABS 0.1 05/23/2020 0926   Recent Labs    03/31/22 0105 04/08/22 1203 04/23/22 1626  HGB 12.4* 14.1 14.0    CMP     Component Value  Date/Time   NA 139 05/29/2022 1419   NA 143 05/23/2020 0926   K 4.3 05/29/2022 1419   CL 106 05/29/2022 1419   CO2 27 05/29/2022 1419   GLUCOSE 93 05/29/2022 1419   BUN 24 (H) 05/29/2022 1419   BUN 24 05/23/2020 0926   CREATININE 1.08 05/29/2022 1419   CREATININE 0.92 06/01/2018 1222   CALCIUM 9.3 05/29/2022 1419   PROT 6.5 05/29/2022 1419   PROT 6.6 05/23/2020 0926   ALBUMIN 3.3 (L) 05/29/2022 1419   ALBUMIN 4.1 05/23/2020 0926   AST 9 05/29/2022 1419   ALT 10 05/29/2022 1419   ALKPHOS 62 05/29/2022 1419   BILITOT 0.3 05/29/2022 1419   BILITOT 0.2 05/23/2020 0926   GFRNONAA >60 04/23/2022 1626   GFRNONAA 102 06/01/2018 1222   GFRAA >60 10/04/2019 2036   GFRAA 118 06/01/2018 1222      Latest Ref Rng & Units 05/29/2022    2:19 PM 04/23/2022    4:26 PM 04/08/2022   12:03 PM  Hepatic Function  Total Protein 6.0 - 8.3 g/dL 6.5  6.3  6.4   Albumin 3.5 - 5.2 g/dL 3.3  3.2  3.4  AST 0 - 37 U/L 9  17  11    ALT 0 - 53 U/L 10  16  11    Alk Phosphatase 39 - 117 U/L 62  61  61   Total Bilirubin 0.2 - 1.2 mg/dL 0.3  0.2  0.4       Current Medications:        Current Outpatient Medications (Other):    methocarbamol (ROBAXIN) 500 MG tablet, Take 1 tablet (500 mg total) by mouth 4 (four) times daily.   pantoprazole (PROTONIX) 40 MG tablet, TAKE 1 TABLET(40 MG) BY MOUTH DAILY   pregabalin (LYRICA) 50 MG capsule, Take 1 capsule (50 mg total) by mouth 3 (three) times daily.   linaclotide (LINZESS) 145 MCG CAPS capsule, Take 1 capsule (145 mcg total) by mouth daily before breakfast.   senna (SENOKOT) 8.6 MG TABS tablet, TAKE 1 TABLET BY MOUTH DAILY AS NEEDED FOR MILD CONSTIPATION (Patient not taking: Reported on 07/08/2022)  Medical History:  Past Medical History:  Diagnosis Date   Allergy    Dermatitis 07/31/2020   Enlarged lymph nodes 05/23/2020   Generalized abdominal pain 07/31/2020   History of pancreatitis 06/01/2018   Hypertension    Loss of weight 05/23/2020    Pancreatitis    Right groin mass 05/23/2020   Allergies: No Known Allergies   Surgical History:  He  has a past surgical history that includes Tympanostomy tube placement (Bilateral); Hernia repair; and Nasal fracture surgery. Family History:  His family history includes Asthma in his daughter and son; Depression in his sister; Diabetes in his father; Hypertension in his father and paternal grandmother; Other in his sister.  REVIEW OF SYSTEMS  : All other systems reviewed and negative except where noted in the History of Present Illness.  PHYSICAL EXAM: BP 122/68   Pulse (!) 55   Ht 5\' 10"  (1.778 m)   Wt 98 lb 6.4 oz (44.6 kg)   SpO2 97%   BMI 14.12 kg/m  General Appearance: Thin cachectic, in no apparent distress. Head:   Normocephalic and atraumatic. Eyes:  sclerae anicteric,conjunctive pink  Respiratory: Respiratory effort normal, BS equal bilaterally without rales, rhonchi, wheezing. Cardio: RRR with no MRGs. Peripheral pulses intact.  Abdomen: Soft,  Flat ,active bowel sounds. mild tenderness in the epigastrium. Without guarding and Without rebound. No masses. Rectal: Normal external rectal exam other than hypertrophied anal papula posterior, increased rectal tone, no internal hemorrhoids appreciated, no masses, non tender, scant brown stool, hemoccult Negative Musculoskeletal: Full ROM, Normal gait. Without edema. Skin:  Dry and intact without significant lesions or rashes Neuro: Alert and  oriented x4;  No focal deficits. Psych:  Cooperative. Normal mood and affect.    Doree Albee, PA-C 12:15 PM

## 2022-07-08 ENCOUNTER — Other Ambulatory Visit: Payer: Managed Care, Other (non HMO)

## 2022-07-08 ENCOUNTER — Ambulatory Visit (INDEPENDENT_AMBULATORY_CARE_PROVIDER_SITE_OTHER): Payer: Managed Care, Other (non HMO) | Admitting: Physician Assistant

## 2022-07-08 ENCOUNTER — Encounter: Payer: Self-pay | Admitting: Physician Assistant

## 2022-07-08 VITALS — BP 122/68 | HR 55 | Ht 70.0 in | Wt 98.4 lb

## 2022-07-08 DIAGNOSIS — K861 Other chronic pancreatitis: Secondary | ICD-10-CM | POA: Diagnosis not present

## 2022-07-08 DIAGNOSIS — F17298 Nicotine dependence, other tobacco product, with other nicotine-induced disorders: Secondary | ICD-10-CM

## 2022-07-08 DIAGNOSIS — K5904 Chronic idiopathic constipation: Secondary | ICD-10-CM

## 2022-07-08 DIAGNOSIS — R634 Abnormal weight loss: Secondary | ICD-10-CM

## 2022-07-08 DIAGNOSIS — R1013 Epigastric pain: Secondary | ICD-10-CM | POA: Diagnosis not present

## 2022-07-08 LAB — BASIC METABOLIC PANEL
BUN: 36 mg/dL — ABNORMAL HIGH (ref 6–23)
CO2: 29 mEq/L (ref 19–32)
Calcium: 9.8 mg/dL (ref 8.4–10.5)
Chloride: 101 mEq/L (ref 96–112)
Creatinine, Ser: 1.43 mg/dL (ref 0.40–1.50)
GFR: 58.29 mL/min — ABNORMAL LOW (ref >60.00)
Glucose, Bld: 102 mg/dL — ABNORMAL HIGH (ref 70–99)
Potassium: 3.9 mEq/L (ref 3.5–5.1)
Sodium: 137 mEq/L (ref 135–145)

## 2022-07-08 LAB — HEPATIC FUNCTION PANEL
ALT: 10 U/L (ref 0–53)
AST: 11 U/L (ref 0–37)
Albumin: 4.2 g/dL (ref 3.5–5.2)
Alkaline Phosphatase: 69 U/L (ref 39–117)
Bilirubin, Direct: 0.1 mg/dL (ref 0.0–0.3)
Total Bilirubin: 0.3 mg/dL (ref 0.2–1.2)
Total Protein: 7.7 g/dL (ref 6.0–8.3)

## 2022-07-08 LAB — CBC WITH DIFFERENTIAL/PLATELET
Basophils Absolute: 0 10*3/uL (ref 0.0–0.1)
Basophils Relative: 0.5 % (ref 0.0–3.0)
Eosinophils Absolute: 0.1 10*3/uL (ref 0.0–0.7)
Eosinophils Relative: 0.9 % (ref 0.0–5.0)
HCT: 42.2 % (ref 39.0–52.0)
Hemoglobin: 14.6 g/dL (ref 13.0–17.0)
Lymphocytes Relative: 31 % (ref 12.0–46.0)
Lymphs Abs: 2.3 10*3/uL (ref 0.7–4.0)
MCHC: 34.7 g/dL (ref 30.0–36.0)
MCV: 95.5 fl (ref 78.0–100.0)
Monocytes Absolute: 0.4 10*3/uL (ref 0.1–1.0)
Monocytes Relative: 5.9 % (ref 3.0–12.0)
Neutro Abs: 4.6 10*3/uL (ref 1.4–7.7)
Neutrophils Relative %: 61.7 % (ref 43.0–77.0)
Platelets: 587 10*3/uL — ABNORMAL HIGH (ref 150.0–400.0)
RBC: 4.42 Mil/uL (ref 4.22–5.81)
RDW: 14.6 % (ref 11.5–15.5)
WBC: 7.5 10*3/uL (ref 4.0–10.5)

## 2022-07-08 LAB — TRIGLYCERIDES: Triglycerides: 142 mg/dL (ref 0.0–149.0)

## 2022-07-08 LAB — LIPASE: Lipase: 232 U/L — ABNORMAL HIGH (ref 11.0–59.0)

## 2022-07-08 LAB — TSH: TSH: 0.94 u[IU]/mL (ref 0.35–5.50)

## 2022-07-08 MED ORDER — PREGABALIN 50 MG PO CAPS: 50.0000 mg | ORAL_CAPSULE | Freq: Three times a day (TID) | ORAL | 0 refills | Status: DC

## 2022-07-08 MED ORDER — LINACLOTIDE 290 MCG PO CAPS: 290.0000 ug | ORAL_CAPSULE | Freq: Every day | ORAL | 0 refills | Status: DC

## 2022-07-08 MED ORDER — LINACLOTIDE 145 MCG PO CAPS: 145.0000 ug | ORAL_CAPSULE | Freq: Every day | ORAL | 0 refills | Status: DC

## 2022-07-08 NOTE — Patient Instructions (Signed)
Your provider has requested that you go to the basement level for lab work before leaving today. Press "B" on the elevator. The lab is located at the first door on the left as you exit the elevator.  Will get CT scan and labs to evaluate your weight loss Can take lyrica, start 50 mg at night, can take 2 at night and one in the morning.   We have sent the following medications to your pharmacy for you to pick up at your convenience:  Linzess 145 mcg.  Linzess works best when taken once a day every day, on an empty stomach, at least 30 minutes before your first meal of the day.  When Linzess is taken daily as directed:  *Constipation relief is typically felt in about a week *IBS-C patients may begin to experience relief from belly pain and overall abdominal symptoms (pain, discomfort, and bloating) in about 1 week,   with symptoms typically improving over 12 weeks.  Diarrhea may occur in the first 2 weeks -keep taking it.  The diarrhea should go away and you should start having normal, complete, full bowel movements. It may be helpful to start treatment when you can be near the comfort of your own bathroom, such as a weekend.     You have been scheduled for a CT scan of the abdomen and pelvis at [facility], 1st floor Radiology. You are scheduled on 07/22/2022 at 4:30pm. You should arrive 15 minutes prior to your appointment time for registration.    You may take any medications as prescribed with a small amount of water, if necessary. If you take any of the following medications: METFORMIN, GLUCOPHAGE, GLUCOVANCE, AVANDAMET, RIOMET, FORTAMET, ACTOPLUS MET, JANUMET, GLUMETZA or METAGLIP, you MAY be asked to HOLD this medication 48 hours AFTER the exam.   If you have any questions regarding your exam or if you need to reschedule, you may call [facility] at [phone] between the hours of 8:00 am and 5:00 pm, Monday-Friday.      Chronic Pancreatitis  Chronic  pancreatitis is permanent inflammation and scarring of the pancreas that leads to pancreatic dysfunction. The pancreas is a gland that is found behind the stomach. The pancreas makes proteins (enzymes) that help to digest food. It also releases hormones called glucagon and insulin. These help regulate blood sugar (glucose). Damage to the pancreas may affect digestion, may cause pain in the upper abdomen and back, and may cause diabetes. Inflammation can also irritate other organs in the abdomen near the pancreas. At first, pancreatitis may be sudden (acute). When you have repeated or long-lasting episodes of acute pancreatitis, damage to the pancreas can be permanent and lead to chronic pancreatitis. Sometimes, though, there is no history of acute pancreatitis. What are the causes? The most common cause of this condition is heavy alcohol use. Other causes include: Hypertriglyceridemia. This is increased, or elevated, levels of triglycerides in the blood. Gallstones or other conditions that block the tube that drains the pancreas (pancreatic duct). Health conditions such as pancreatic cancer or a problem where the body's defense system (immune system) attacks the pancreas (autoimmune pancreatitis). Hypercalcemia. This is elevated calcium levels in the blood. This condition may be caused by the parathyroid gland being too active (hyperparathyroidism). Having an injured or infected pancreas. Being exposed to certain medicines or certain chemicals. In children, chronic pancreatitis is most often caused by inherited conditions. These come from genes that are passed from parent to child. The most common of  these conditions is cystic fibrosis. In some cases, the cause of chronic pancreatitis may not be known. What increases the risk? This condition is more likely to develop in people who: Are male. Are 58-40 years old. Have a family history of pancreatitis. Smoke tobacco. Drink a lot of alcohol over a  long period of time. What are the signs or symptoms? Symptoms of this condition may include: Pain in the abdomen or upper back. Pain may be severe and often gets worse after you eat. Nausea and vomiting. Fever. Weight loss. A change in the color and firmness (consistency) of stool (feces), such as stools that are oily, fatty, or clay-colored. How is this diagnosed? This condition is diagnosed based on your symptoms, your medical history, and a physical exam. You may have tests, such as: Blood tests. Stool samples. Biopsy of the pancreas. This is the removal of a sample of pancreas tissue to be tested in a lab. Imaging tests, such as CT scans, MRIs, or an ultrasound of the abdomen. How is this treated? Chronic pancreatitis results in permanent damage that leads to pancreatic dysfunction and long-term (chronic) pain. To manage chronic pancreatitis, you will need to: Stop using alcohol or tobacco. Manage pain. Methods of pain control may include: Medicines such as those used for pain or depression (antidepressants). Surgery to remove a blockage or buildup of fluid causing pain. A procedure to block the nerves that sense pain in the pancreas (celiac plexus nerve block). Improve digestion. You may be given: Medicines to replace your pancreatic enzymes. Vitamin supplements. A specific diet to follow. You may work with a dietitian to make an eating plan. Monitor for the development of diabetes. You may need to: Get screened for diabetes regularly. Check your blood glucose levels at home at regular times. Sometimes, acute flares of pain may require hospital treatment. Follow these instructions at home: Eating and drinking     Do not drink alcohol. If you need help quitting, ask your health care provider. Follow a diet as told by your health care provider or dietitian, if this applies. This may include: Limiting how much fat you eat. Eating smaller meals more often. Avoiding  caffeine. Drink enough fluid to keep your urine pale yellow. General instructions Take over-the-counter and prescription medicines only as told by your health care provider. These include vitamin supplements. Ask your health care provider if the medicine prescribed to you: Requires you to avoid driving or using machinery. Can cause constipation. You may need to take these actions to prevent or treat constipation: Take over-the-counter or prescription medicines. Eat foods that are high in fiber, such as beans, whole grains, and fresh fruits and vegetables. Limit foods that are high in fat and processed sugars, such as fried or sweet foods. Do not use any products that contain nicotine or tobacco. These products include cigarettes, chewing tobacco, and vaping devices, such as e-cigarettes. If you need help quitting, ask your health care provider. If directed, check your blood sugar at home as told. Keep all follow-up visits. This is important. Contact a health care provider if: You have pain that does not get better with medicine. You have a fever. You have sudden weight loss. Get help right away if: Your pain suddenly gets worse. You have sudden swelling in your abdomen. You start to vomit often. You have diarrhea that does not go away. You vomit blood or have blood in your stool. You become confused or you have trouble thinking clearly. These symptoms may be  an emergency. Get help right away. Call 911. Do not wait to see if the symptoms will go away. Do not drive yourself to the hospital. Summary Chronic pancreatitis is permanent inflammation and scarring of the pancreas that leads to pancreatic dysfunction. Damage to the pancreas may affect digestion, may cause pain in the upper abdomen and back, and may cause diabetes. Inflammation can also irritate other organs in the abdomen near the pancreas. Common causes of this condition are heavy alcohol use, gallstones, increased (elevated)  levels of triglycerides, and certain medicines. To manage this condition: control pain, replace enzymes, and do not drink alcohol. This information is not intended to replace advice given to you by your health care provider. Make sure you discuss any questions you have with your health care provider. Document Revised: 01/22/2021 Document Reviewed: 01/22/2021 Elsevier Patient Education  2023 Elsevier Inc.   Anal Fissure, Adult  Diltiazem/lidocaine 3 x daily for 2 months sent to compound pharmacy    Sent this medication to a compound pharmacy:  [facility] [address] [place], [place], [place] [address]  [phone]   Please DO NOT go directly from our office to pick up this medication! Give the pharmacy 1 day to process the prescription. Extra time is required for them to compound your medication.  Follow up should symptoms persist for secondary evaluation and possible surgical referral for repair.  Anal Fissure, Adult  An anal fissure is a small tear or crack in the tissue around the opening of the butt (anus). Bleeding from the tear or crack usually stops on its own within a few minutes. The bleeding may happen every time you poop (have a bowel movement) until the tear or crack heals. What are the causes? This condition is usually caused by passing a large or hard poop (stool). Other causes include: Trouble pooping (constipation). Passing watery poop (diarrhea). Inflammatory bowel disease (Crohn's disease or ulcerative colitis). Childbirth. Infections. Anal sex. What are the signs or symptoms? Symptoms of this condition include: Bleeding from the butt. Small amounts of blood on your poop. The blood coats the outside of the poop. It is not mixed with the poop. Small amounts of blood on the toilet paper or in the toilet after you poop. Pain when passing poop. Itching or irritation around the opening of the butt. How is this diagnosed? This condition may be diagnosed  based on a physical exam. Your doctor may: Check your butt. A tear can often be seen by checking the area with care. Check your butt using a short tube (anoscope). The light in the tube will show any problems in your butt. How is this treated? Treatment for this condition may include: Treating problems that make it hard for you to pass poop. You may be told to: Eat more fiber. Drink more fluid. Take fiber supplements. Take medicines that make poop soft. Taking warm water baths (sitz baths). This may help to heal the tear. Using creams and ointments. If your condition gets worse, other treatments may be needed such as: A shot near the tear or crack (botulinum injection). Surgery to repair the tear or crack. Follow these instructions at home: Eating and drinking  Avoid bananas and dairy products. These foods can make it hard to poop. Drink enough fluid to keep your pee (urine) pale yellow. Eat foods that have a lot of fiber in them, such as: Beans. Whole grains. Fresh fruits. Fresh vegetables. General instructions  Take over-the-counter and prescription medicines only as told  by your doctor. Use creams or ointments only as told by your doctor. Keep the butt area as clean and dry as you can. Take a warm water bath as told by your doctor. Do not use soap. Keep all follow-up visits as told by your doctor. This is important. Contact a doctor if: You have more bleeding. You have a fever. You have watery poop that is mixed with blood. You have pain. Your problem gets worse, not better. Summary An anal fissure is a small tear or crack in the skin around the opening of the butt (anus). This condition is usually caused by passing a large or hard poop (stool). Treatment includes treating the problems that make it hard for you to pass poop. Follow your doctor's instructions about caring for your condition at home. Keep all follow-up visits as told by your doctor. This is  important. This information is not intended to replace advice given to you by your health care provider. Make sure you discuss any questions you have with your health care provider. Document Revised: 09/27/2020 Document Reviewed: 09/27/2020 Elsevier Patient Education  2023 [employer].

## 2022-07-09 LAB — IGG 4: IgG, Subclass 4: 71 mg/dL (ref 2–96)

## 2022-07-09 LAB — ANA: Anti Nuclear Antibody (ANA): NEGATIVE

## 2022-07-09 NOTE — Progress Notes (Signed)
Agree with the assessment and plan as outlined by Quentin Mulling,  PA-C.  Would also recommend trial of pancreatic enzyme replacement at this point.    Iyania Denne E. Tomasa Rand, MD

## 2022-07-10 ENCOUNTER — Telehealth: Payer: Self-pay

## 2022-07-10 ENCOUNTER — Other Ambulatory Visit (HOSPITAL_COMMUNITY): Payer: Self-pay

## 2022-07-10 ENCOUNTER — Telehealth: Payer: Self-pay | Admitting: Pharmacy Technician

## 2022-07-10 MED ORDER — DILTIAZEM GEL 2 %: 1.0000 | Freq: Three times a day (TID) | CUTANEOUS | 1 refills | Status: DC

## 2022-07-10 NOTE — Telephone Encounter (Signed)
Patient Advocate Encounter  Received notification from [employer] that prior authorization for [facility] is required.   PA submitted on 4.25.24 [place] [id number] Submitted via: [email or web address]

## 2022-07-10 NOTE — Telephone Encounter (Signed)
Spoke with patient and told him I would leave some Creon samples up front for him to use for a trial.  Also resent Diltiazem gel to [facility] because patient said they did not receive it.

## 2022-07-10 NOTE — Telephone Encounter (Signed)
-----   Message from Doree Albee, [place] sent at 07/09/2022  8:13 AM EDT ----- Can we please do a trial of Creon for this patient? Can leave samples upfront for his weight.  Exocrine Pancreatic Insufficiency (EPI) EPI is a condition in which your body doesn't provide enough pancreatic enzymes to properly digest your food (which can sometimes lead to some unpleasant digestive symptoms such as bloating, AB pain and loose stools, weight loss).   For many people, EPI is also a chronic lifelong condition.   That's why its so important to know what to expect with your EPI treatment, because with the right plan in place, EPI is manageable.  HOW DO I TAKE PANCREATIC ENZYMES?  Pancreatic Enzyme Replacement therapy (Creon) is only available through prescription and cannot be substituted with over the counter alternatives.   Your doctor will personalize your dose based on your weight, diet, and symptoms.  The number of capsules you take per meal will depend on your prescribed dose.  Creon must be taken DURING every meal and snack.   Whether its a full meal or a snack, take Creon every time you eat. Remember to follow your treatment plan closely and take Creon exactly as prescribed - consistency is key!!  Dose adjustments are normal with Creon.  Your doctor will start your on the dose they deem appropriate for you.   Remember to follow up with your doctor after the first two weeks to ensure your therapy is effective and you're managing your treatment appropriately.  ----- Message ----- From: Jenel Lucks, MD Sent: 07/09/2022   6:42 AM EDT To: Doree Albee, PA-C     ----- Message ----- From: Zachary Harper Sent: 07/08/2022  12:22 PM EDT To: Jenel Lucks, MD

## 2022-07-14 NOTE — Telephone Encounter (Signed)
Patient Advocate Encounter  Received a fax from [facility] regarding Prior Authorization for Linzess capsules.   Authorization has been DENIED due to    Determination attached to patient chart

## 2022-07-15 ENCOUNTER — Telehealth: Payer: Self-pay

## 2022-07-15 NOTE — Telephone Encounter (Signed)
Pt was notified of message received.  Pt stated that he did not have anything to write the information down at the moment and requested a call back.

## 2022-07-15 NOTE — Telephone Encounter (Signed)
Please see note below and advise  

## 2022-07-15 NOTE — Telephone Encounter (Signed)
-----   Message from April D McPeak sent at 07/15/2022  7:15 AM EDT ----- Regarding: Place of Service Good morning. His insurance was being very difficult to get his scan approved but I finally got it approved with:   [facility] [address] [place], [place], [address] Phone:[phone]  Can you please let the patient know?  Thanks!

## 2022-07-16 MED ORDER — TRULANCE 3 MG PO TABS: 3.0000 mg | ORAL_TABLET | Freq: Every day | ORAL | 0 refills | Status: AC

## 2022-07-16 NOTE — Telephone Encounter (Signed)
Pt made aware of Quentin Mulling PA recommendations: Pt notified prescription has been sent. Pt verbalized understanding with all questions answered.

## 2022-07-16 NOTE — Telephone Encounter (Signed)
Patient called stating that he spoke with [facility] today and they are still not able to schedule him without a referral. Please advise, thank you.

## 2022-07-16 NOTE — Telephone Encounter (Signed)
Please see notes below and advise  Pt was contacted prior and given prior information that he has been approved for imaging at   [facility] [address] [place], [place], [address] Phone:[phone] Pt notified to call number and schedule imaging.  Please advise

## 2022-07-22 ENCOUNTER — Encounter (HOSPITAL_COMMUNITY): Payer: Self-pay

## 2022-07-22 ENCOUNTER — Ambulatory Visit (HOSPITAL_COMMUNITY): Payer: Managed Care, Other (non HMO)

## 2022-07-23 DIAGNOSIS — R1013 Epigastric pain: Secondary | ICD-10-CM

## 2022-07-23 DIAGNOSIS — R634 Abnormal weight loss: Secondary | ICD-10-CM

## 2022-07-23 DIAGNOSIS — K861 Other chronic pancreatitis: Secondary | ICD-10-CM

## 2022-07-23 IMAGING — CT CT ABD-PELV W/ CM
2 of 5 series · 15 of 46 positions shown, 17 images · IV contrast (APPLIED)
Comparison: Abdominopelvic CT 09/16/2019 and 06/17/2011.

CLINICAL DATA: Diffuse abdominal pain radiating into the back.
Right groin bulge. Hernia suspected.

EXAM:
CT ABDOMEN AND PELVIS WITH CONTRAST
TECHNIQUE: Multidetector CT imaging of the abdomen and pelvis was performed
using the standard protocol following bolus administration of
intravenous contrast.
CONTRAST:  100mL OMNIPAQUE IOHEXOL 300 MG/ML  SOLN

[Series 2: axial st · axial · 0.61mm/px · z∈[+959,+1309]mm · 12 of 82 slices shown, 14 images]
[im 6/82  soft-tissue]
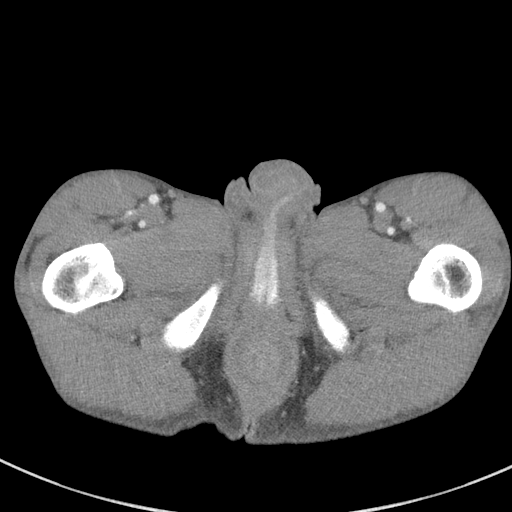
[im 6/82  bone]
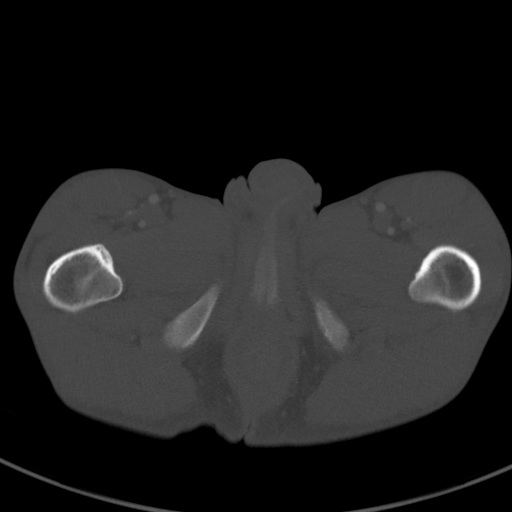
[im 11/82  soft-tissue]
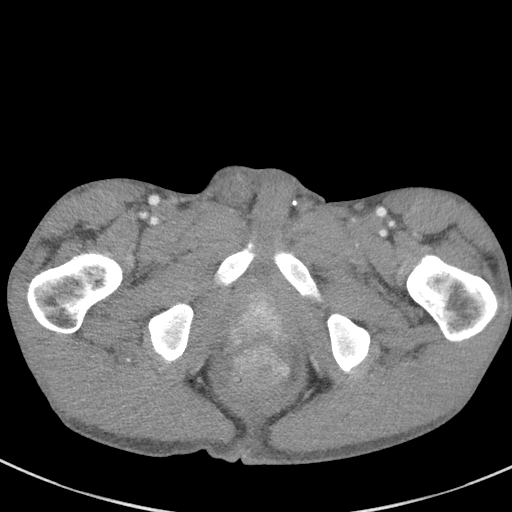
[im 21/82  soft-tissue]
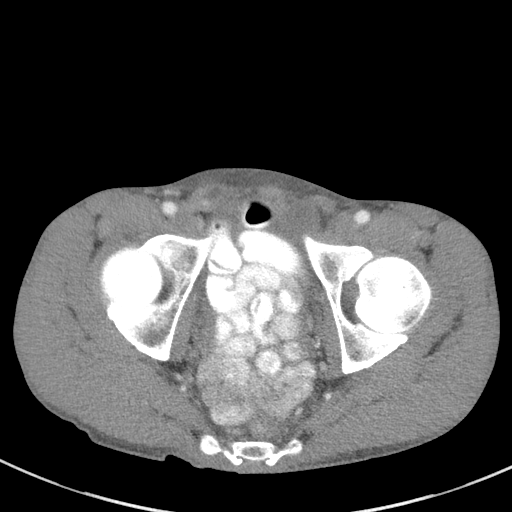
[im 26/82  soft-tissue]
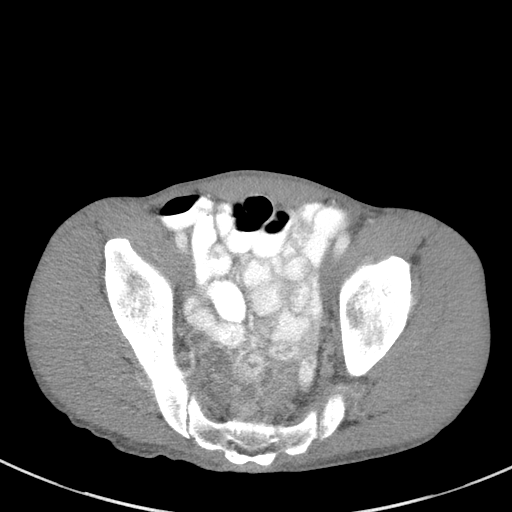
[im 31/82  soft-tissue]
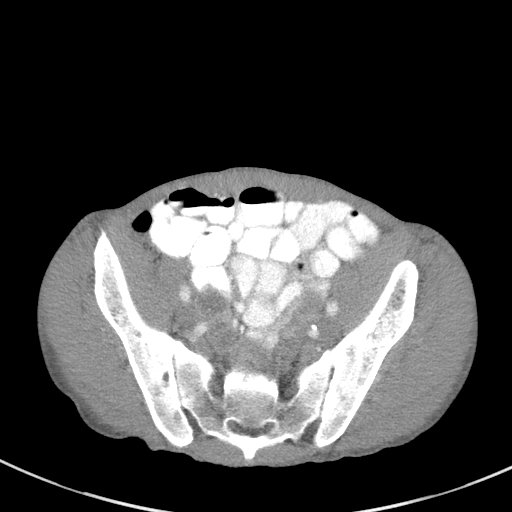
[im 36/82  soft-tissue]
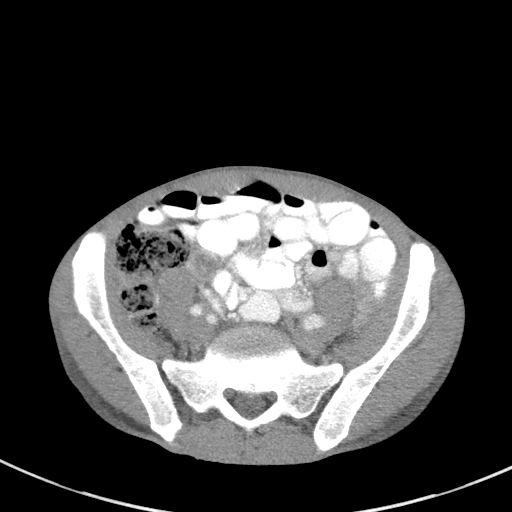
[im 46/82  soft-tissue]
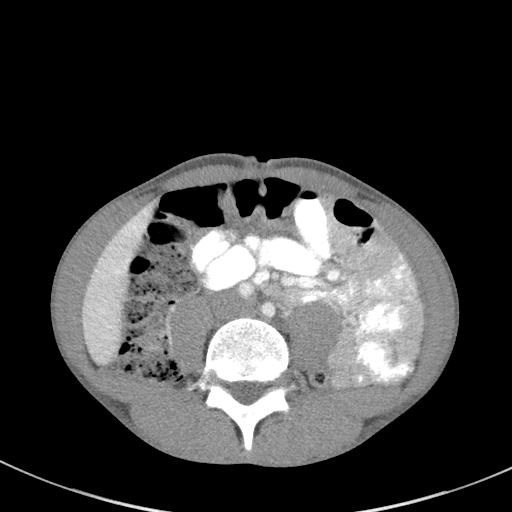
[im 51/82  soft-tissue]
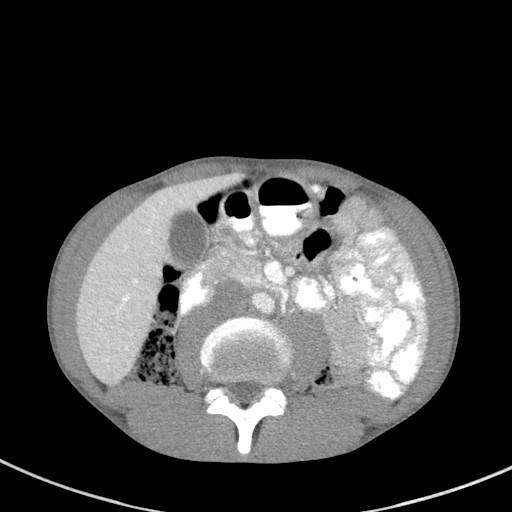
[im 56/82  soft-tissue]
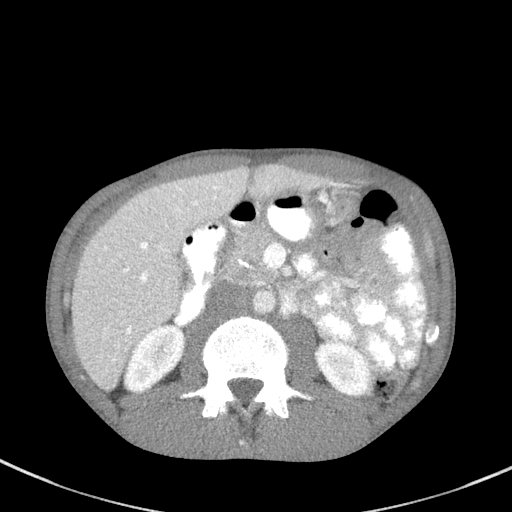
[im 56/82  bone]
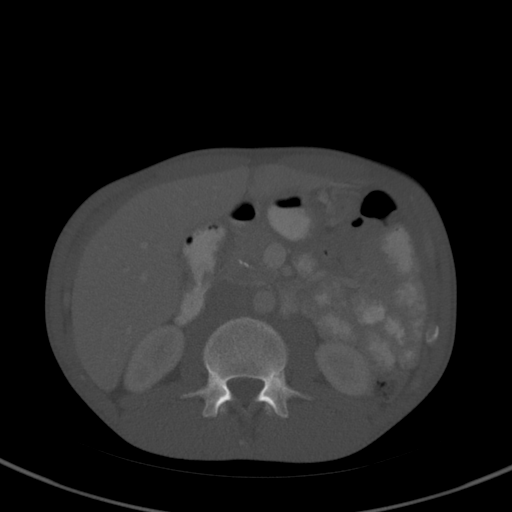
[im 61/82  soft-tissue]
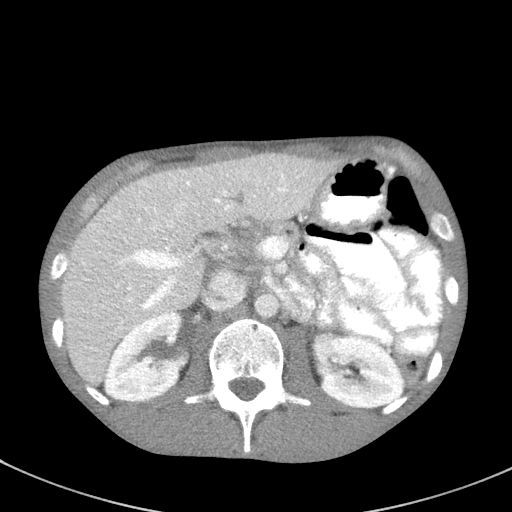
[im 71/82  soft-tissue]
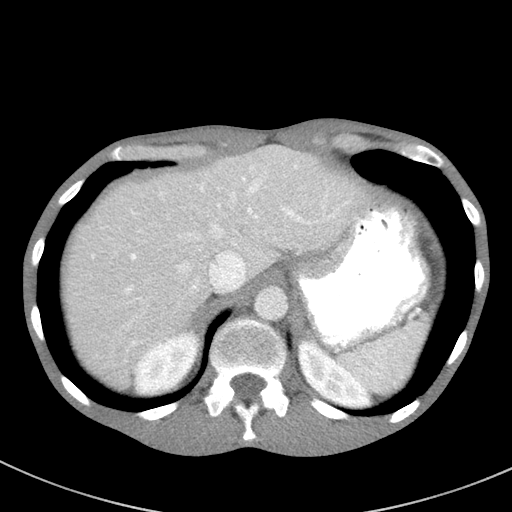
[im 76/82  soft-tissue]
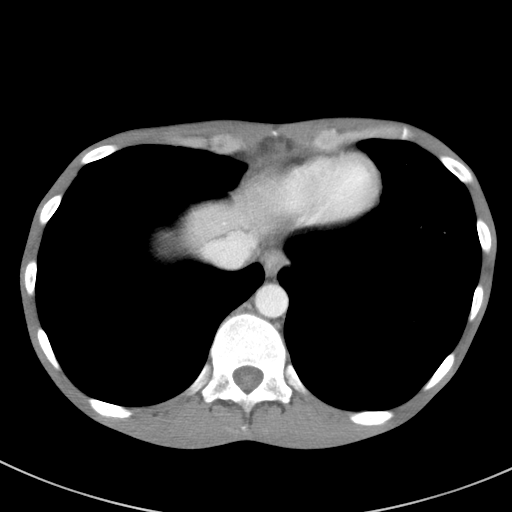

[Series 5: coronal st · coronal · 0.61mm/px · 3 of 74 slices shown]
[im 25/74  soft-tissue]
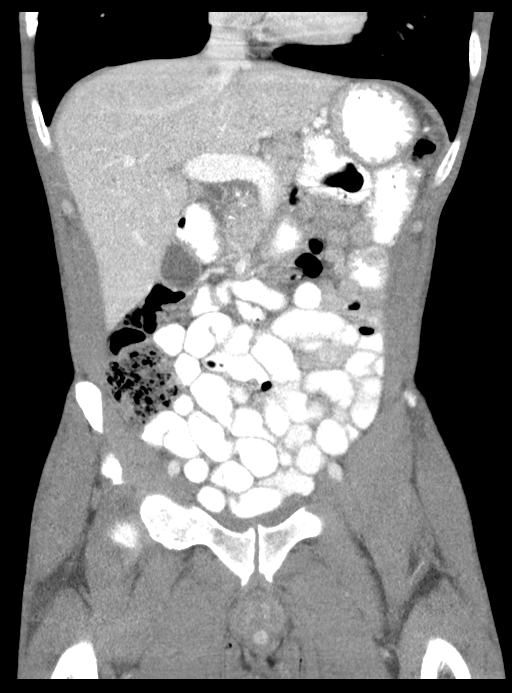
[im 33/74  soft-tissue]
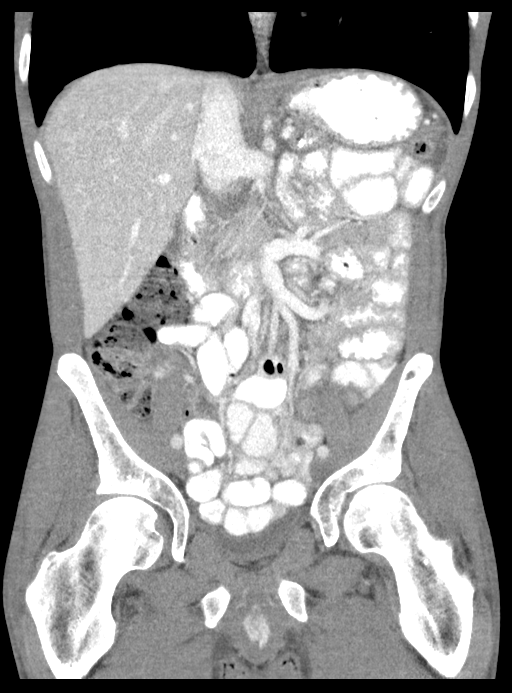
[im 41/74  soft-tissue]
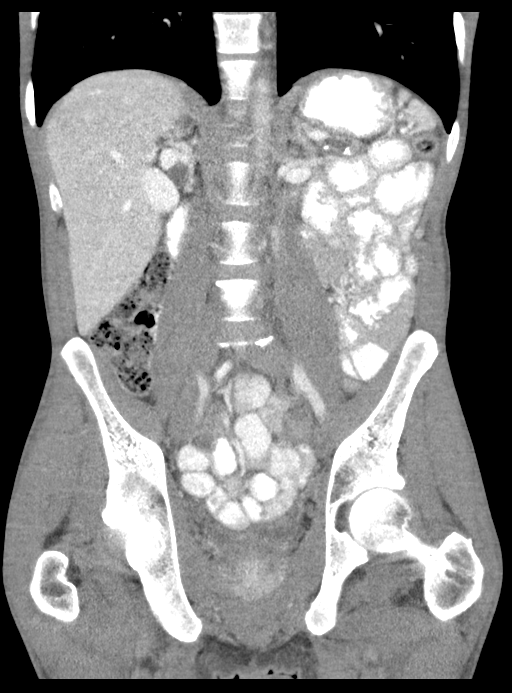

[15 of 46 positions shown; findings below may reference images not displayed]

FINDINGS: Lower chest: Clear lung bases. No significant pleural or pericardial
effusion.

Hepatobiliary: The liver is normal in density without suspicious
focal abnormality. No evidence of gallstones, gallbladder wall
thickening or biliary dilatation.

Pancreas: Multiple parenchymal calcifications throughout the
pancreas and chronic ductal dilatation consistent with chronic
calcific pancreatitis are again noted. No surrounding fluid
collections.

Spleen: Normal in size without focal abnormality.

Adrenals/Urinary Tract: Both adrenal glands appear normal. The
kidneys appear normal without evidence of urinary tract calculus,
suspicious lesion or hydronephrosis. No bladder abnormalities are
seen.

Stomach/Bowel: Enteric contrast was administered and has passed into
the distal small bowel. The stomach and proximal small bowel are
well opacified with contrast and normal in caliber. The terminal
ileum and colon are not opacified with contrast. The appendix and
colon appear normal. There is a new low-density mass in the right
inguinal region which measures 3.7 x 2.3 cm transverse on image
67/2. This extends up to 5.3 cm in length on coronal image [DATE].
This does not have the typical appearance of an inguinal hernia
containing small bowel, and no clear communication with the
peritoneal cavity is identified. In addition, there is no proximal
small bowel obstruction.

Vascular/Lymphatic: There are no enlarged abdominal or pelvic lymph
nodes. No significant vascular findings.

Reproductive: The prostate gland and seminal vesicles appear
unremarkable.

Other: As above, new low-density right inguinal mass which does not
contain contrast. No intraperitoneal fluid collections, ascites or
free air.

Musculoskeletal: Multiple lucent lesions are again noted within the
bony pelvis bilaterally, present on remote CT from 7717, and
slightly larger over this 9 year interval. These are presumably
benign given relative stability.
IMPRESSION: 1. New indeterminate low-density right inguinal mass, without
definite communication with the peritoneal cavity or associated
distal small bowel obstruction. The administered enteric contrast
has not passed beyond this level into the terminal ileum or colon.
This does not have the typical appearance of an inguinal hernia
containing small bowel and may reflect a loculated hydrocele or
other atypical fluid collection. Consider further evaluation with
ultrasound. Clinical follow-up necessary to exclude atypical
incarcerated hernia.
2. Stable chronic changes of chronic calcific pancreatitis.
3. These results will be called to the ordering clinician or
representative by the [HOSPITAL] at the imaging location.

## 2022-08-07 IMAGING — US US PELVIS LIMITED
1 series · 14 of 19 positions shown · non-contrast
Comparison: CT dated 06/20/2020

CLINICAL DATA: Pelvic mass seen on recent CT.

EXAM:
LIMITED ULTRASOUND OF PELVIS
TECHNIQUE: Limited transabdominal ultrasound examination of the pelvis was
performed.

[Series 1: us pelvis limited · 19 acquisitions, 14 frames shown]
[im 1/19]
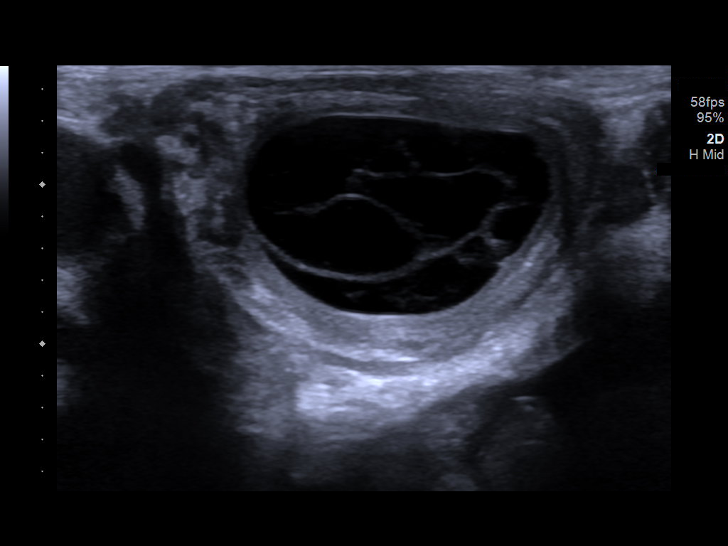
[im 3/19]
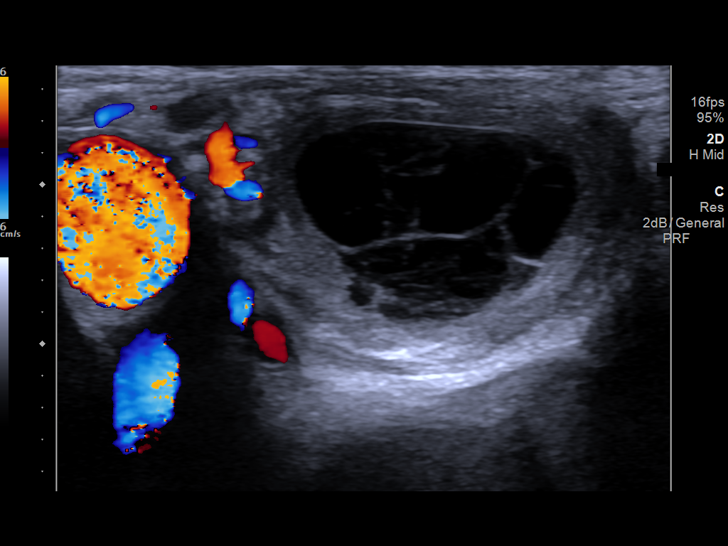
[im 4/19]
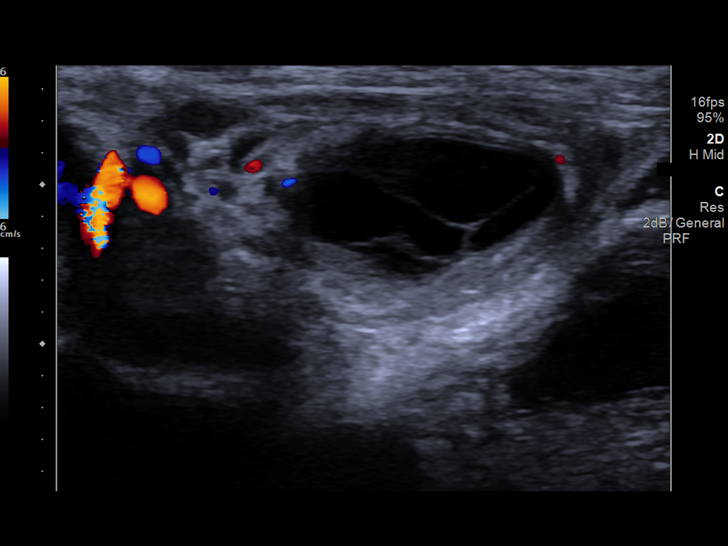
[im 5/19]
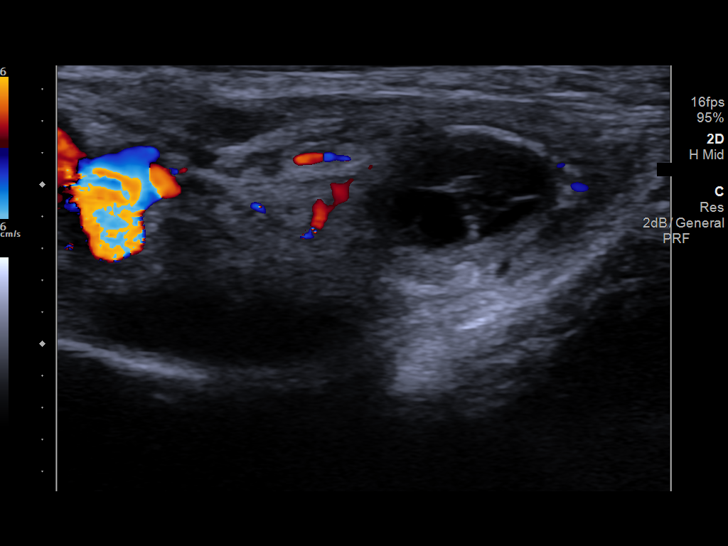
[im 7/19]
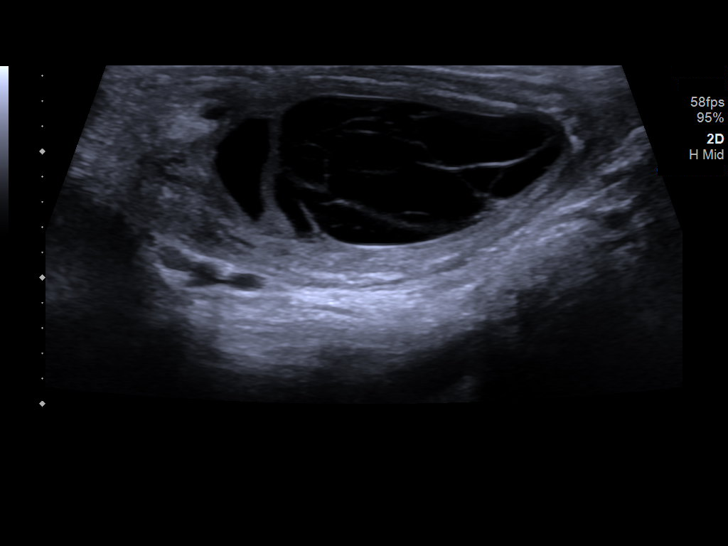
[im 8/19]
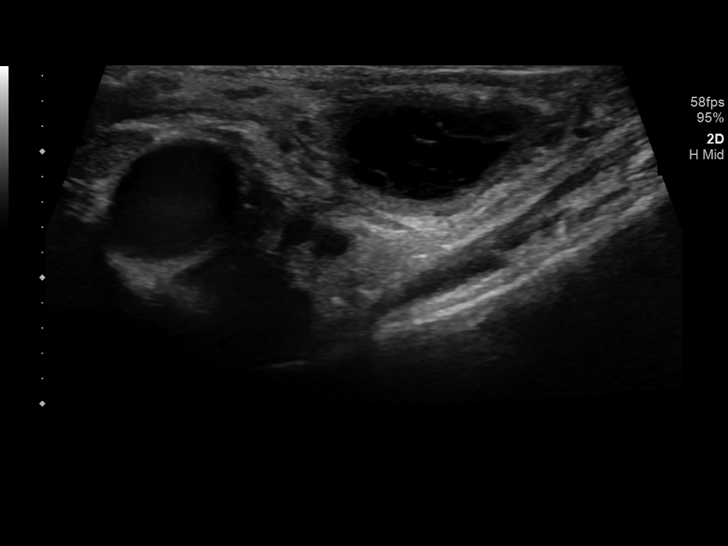
[im 9/19]
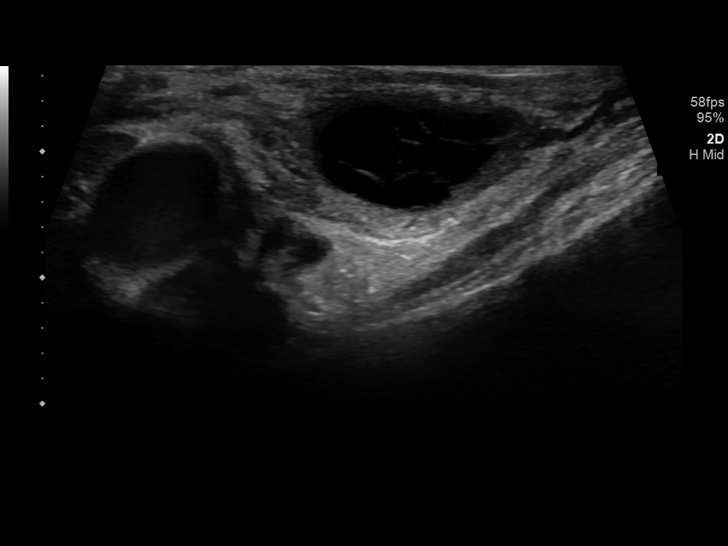
[im 11/19]
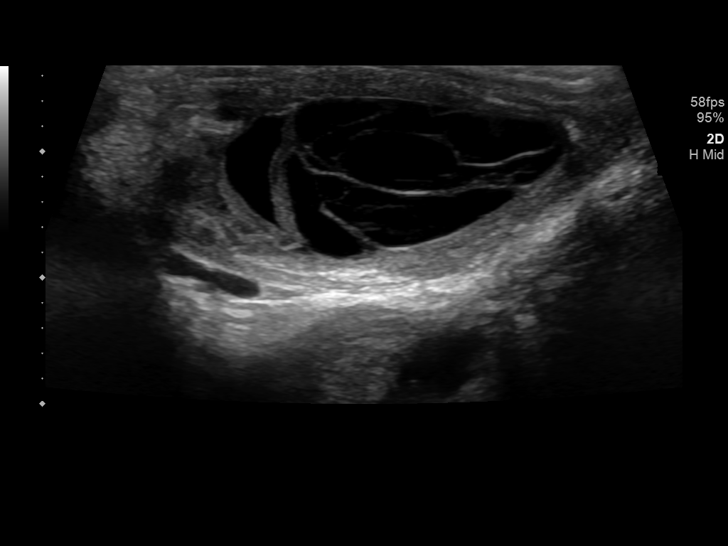
[im 12/19]
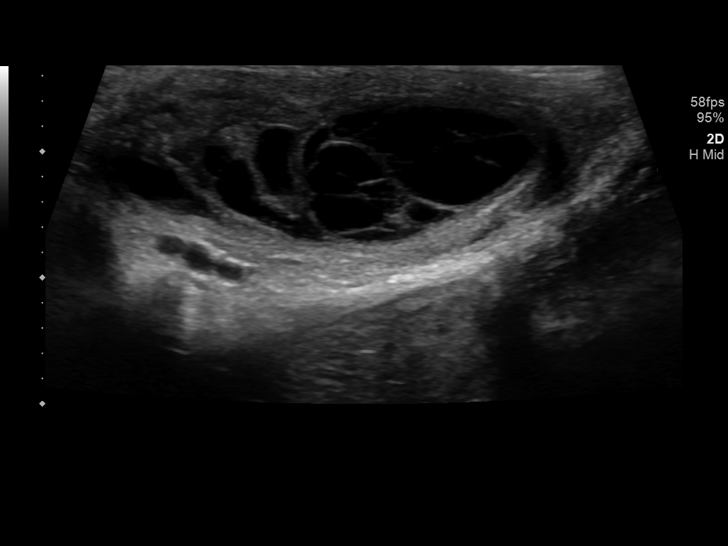
[im 13/19]
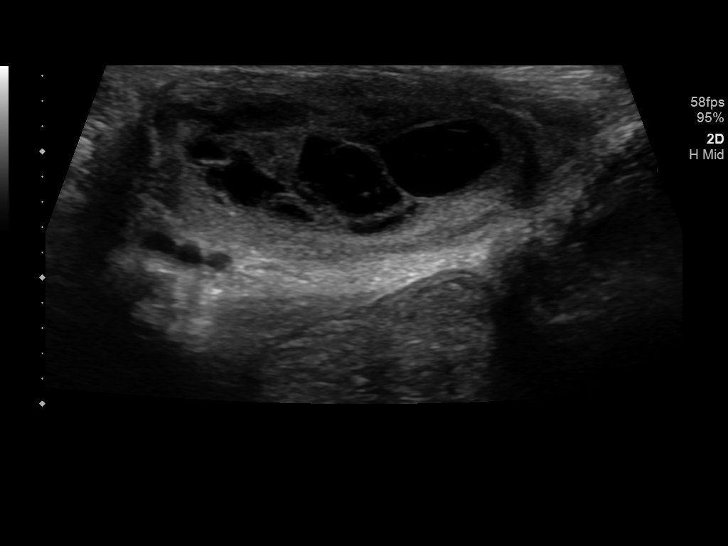
[im 15/19]
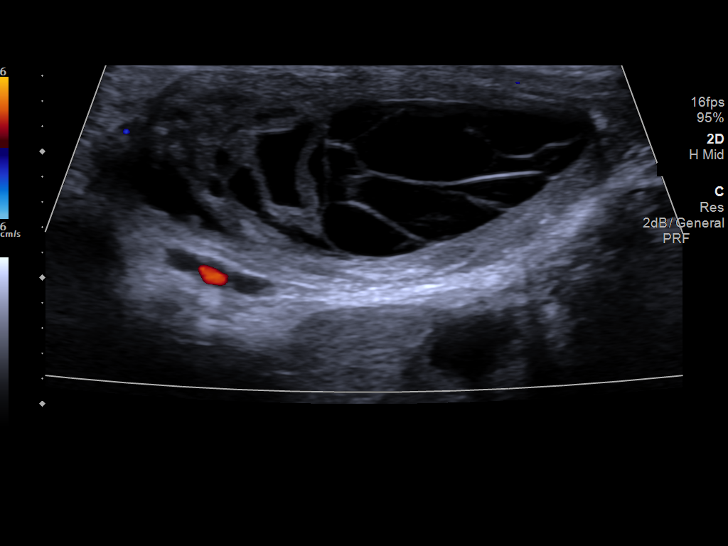
[im 16/19]
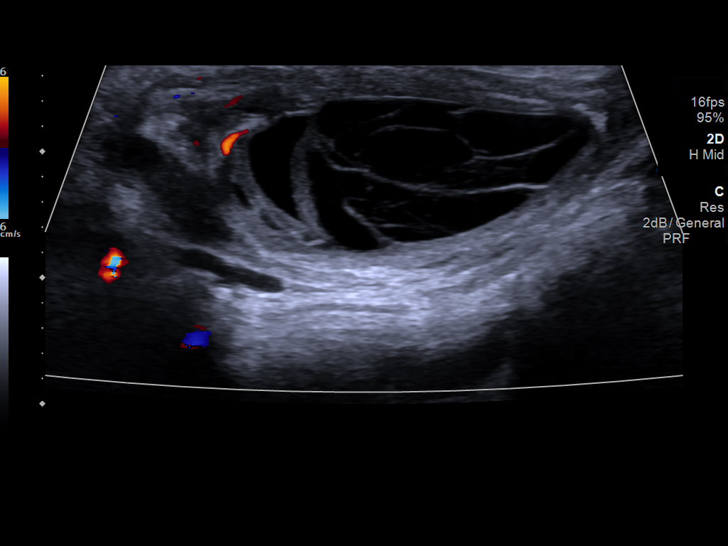
[im 17/19]
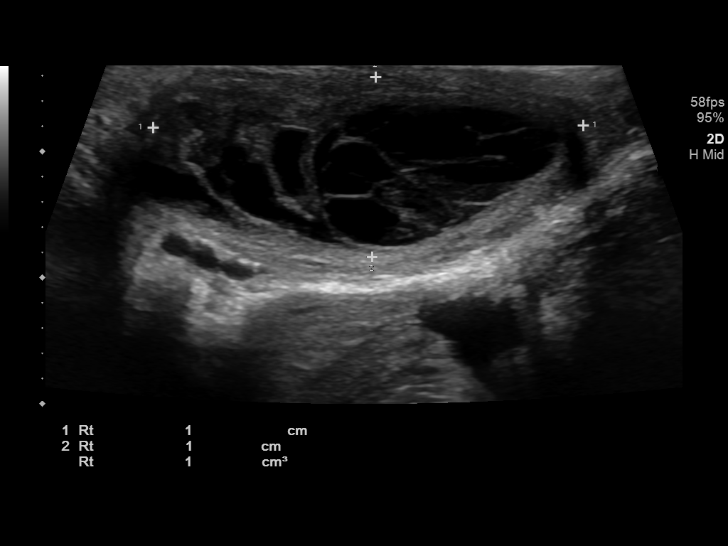
[im 19/19]
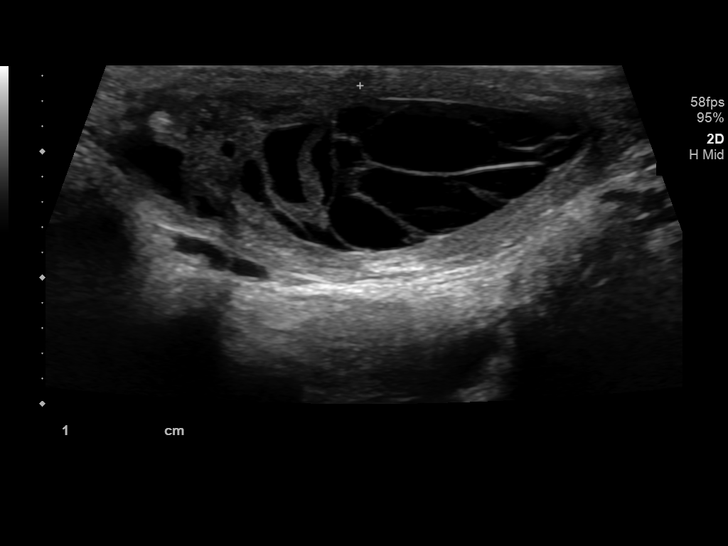

[14 of 19 positions shown; findings below may reference images not displayed]

FINDINGS: Corresponding to the finding seen on the patient's recent CT there
is a complex heterogeneous cystic mass with internal septations and
a thick peripheral border located in the right inguinal region
medial to the inferior epigastric vessels. The septations do not
appear to contain internal color Doppler flow. This mass does not
demonstrate significant internal color Doppler flow.
IMPRESSION: Corresponding to the finding seen on the patient's recent CT is a
complex cystic mass in the right inguinal region. The differential
diagnosis is broad and includes an abscess, necrotic lymph node,
complex hydrocele/spermatocele or a high riding testicle with a
complex cystic mass. A dedicated scrotal ultrasound is recommended
to document a normal appearing right testicle.

## 2022-08-09 ENCOUNTER — Ambulatory Visit (HOSPITAL_BASED_OUTPATIENT_CLINIC_OR_DEPARTMENT_OTHER)
Admission: RE | Admit: 2022-08-09 | Discharge: 2022-08-09 | Disposition: A | Discharge status: Home / Self Care | Payer: Managed Care, Other (non HMO) | Source: Ambulatory Visit | Attending: Physician Assistant | Admitting: Physician Assistant

## 2022-08-09 DIAGNOSIS — R634 Abnormal weight loss: Secondary | ICD-10-CM | POA: Diagnosis present

## 2022-08-09 DIAGNOSIS — R1013 Epigastric pain: Secondary | ICD-10-CM | POA: Diagnosis present

## 2022-08-09 DIAGNOSIS — K861 Other chronic pancreatitis: Secondary | ICD-10-CM | POA: Diagnosis present

## 2022-08-09 MED ORDER — IOHEXOL 300 MG/ML  SOLN
80.0000 mL | Freq: Once | INTRAMUSCULAR | Status: AC | PRN
  Administered 2022-08-09: 80 mL via INTRAVENOUS

## 2022-08-18 ENCOUNTER — Other Ambulatory Visit: Payer: Self-pay

## 2022-08-18 DIAGNOSIS — R1013 Epigastric pain: Secondary | ICD-10-CM

## 2022-08-18 DIAGNOSIS — R634 Abnormal weight loss: Secondary | ICD-10-CM

## 2022-08-19 ENCOUNTER — Telehealth: Payer: Self-pay | Admitting: Physician Assistant

## 2022-08-19 NOTE — Telephone Encounter (Signed)
Patient states he is experiencing abdominal pain and would like to be prescribed a medication. Please advise.

## 2022-08-20 NOTE — Telephone Encounter (Signed)
Pt reports he is having a burning in the top of his stomach and is requesting something for pain. Discussed with pt that we do not prescribe pain medications. Asked pt is he was taking Protonix and he was not sure what I was talking about. He was prescribed 90 pills at the end of January and had 3 refill on the prescription. Pt will look for this and knows to resume taking this 30 min prior to eating in the am. Discussed with him that this med should help with his discomfort.

## 2022-08-21 ENCOUNTER — Other Ambulatory Visit: Payer: Managed Care, Other (non HMO)

## 2022-08-21 ENCOUNTER — Other Ambulatory Visit: Payer: Self-pay

## 2022-08-21 DIAGNOSIS — K861 Other chronic pancreatitis: Secondary | ICD-10-CM

## 2022-08-21 DIAGNOSIS — R1013 Epigastric pain: Secondary | ICD-10-CM

## 2022-08-21 DIAGNOSIS — R634 Abnormal weight loss: Secondary | ICD-10-CM

## 2022-08-21 DIAGNOSIS — K8689 Other specified diseases of pancreas: Secondary | ICD-10-CM

## 2022-08-22 LAB — CANCER ANTIGEN 19-9: CA 19-9: 33 U/mL (ref <34)

## 2022-08-27 ENCOUNTER — Other Ambulatory Visit: Payer: Self-pay | Admitting: Physician Assistant

## 2022-10-02 ENCOUNTER — Encounter: Payer: Self-pay | Admitting: Gastroenterology

## 2022-10-02 ENCOUNTER — Ambulatory Visit: Payer: Managed Care, Other (non HMO) | Admitting: Gastroenterology

## 2022-10-02 ENCOUNTER — Telehealth: Payer: Self-pay | Admitting: Gastroenterology

## 2022-10-02 VITALS — BP 120/90 | HR 68 | Ht 70.0 in | Wt 116.0 lb

## 2022-10-02 DIAGNOSIS — I82891 Chronic embolism and thrombosis of other specified veins: Secondary | ICD-10-CM

## 2022-10-02 DIAGNOSIS — K5900 Constipation, unspecified: Secondary | ICD-10-CM

## 2022-10-02 DIAGNOSIS — K860 Alcohol-induced chronic pancreatitis: Secondary | ICD-10-CM

## 2022-10-02 MED ORDER — PREGABALIN 100 MG PO CAPS: 100.0000 mg | ORAL_CAPSULE | Freq: Three times a day (TID) | ORAL | 3 refills | Status: DC

## 2022-10-02 MED ORDER — PANCRELIPASE (LIP-PROT-AMYL) 36000-114000 UNITS PO CPEP: 36000.0000 [IU] | ORAL_CAPSULE | Freq: Three times a day (TID) | ORAL | 3 refills | Status: DC

## 2022-10-02 NOTE — Patient Instructions (Addendum)
_______________________________________________________  If your blood pressure at your visit was 140/90 or greater, please contact your primary care physician to follow up on this.  _______________________________________________________  If you are age 48 or older, your body mass index should be between 23-30. Your Body mass index is 16.64 kg/m. If this is out of the aforementioned range listed, please consider follow up with your Primary Care Provider.  If you are age 69 or younger, your body mass index should be between 19-25. Your Body mass index is 16.64 kg/m. If this is out of the aformentioned range listed, please consider follow up with your Primary Care Provider.   We have sent the following medications to your pharmacy for you to pick up at your convenience: Lyrica 100 mg three times daily , Creaon 1 capsule with meals and snacks.  ________________________________________________________  The [facility] providers would like to encourage you to use [facility] to communicate with providers for non-urgent requests or questions.  Due to long hold times on the telephone, sending your provider a message by [facility] may be a faster and more efficient way to get a response.  Please allow 48 business hours for a response.  Please remember that this is for non-urgent requests.  _______________________________________________________    It was a pleasure to see you today!  Thank you for trusting me with your gastrointestinal care!    Scott E.Tomasa Rand, MD

## 2022-10-02 NOTE — Telephone Encounter (Signed)
[facility] called and advised that PT needs PA for Creon. It can be done at [email or web address]

## 2022-10-02 NOTE — Progress Notes (Signed)
HPI : Zachary Harper. is a 47 y.o. male with a history of heavy alcohol use, tobacco use and chronic pancreatitis who dents for follow-up of weight loss and abnormal CT scan. His CT scan May 26 of this year showed coarse pancreatic parenchymal calcifications as well as mild pancreatic ductal dilation and increased prominence of the duct in the tail of the pancreas with multiple sidebranches.  There were also multifocal hypodense areas in the body and the head measuring up to 2 cm.  A CA 19-9 was checked and was unremarkable.  He is scheduled for an endoscopic ultrasound for further evaluation next month. At his last visit with Quentin Mulling, he was started on Lyrica to help with his chronic pancreatitis pain.  He was also given samples of Linzess to help with his chronic constipation which he took, but insurance did not cover the prescription.  He was then prescribed Trulance, but did not know what this was for, so has not picked it up yet. Today, he reports that he has been doing better.  His weight is up 216 pounds from 98 pounds in April.  He still has days of bad abdominal pain, but these days are less frequent.  The abdominal pain is localized to the upper abdomen and described as a burning sensation.  Sometimes he has symptoms of bloating and abdominal distention.  The pain is sometimes worsened by eating, but not always. He reports that he follows a vegetarian diet.  He rarely eats fried foods.  He has been making protein shakes with plant-based protein, usually 1 or 2/day for the past month. Most the time he feels okay.  His appetite has been good.  And he is having more frequent bowel movements.  Diarrhea has not been an issue for him.  He states his last drink was in December.  He still does smoke black and mild cigarettes, usually a pack or 2/day.  He knows he needs to stop, but does not know that he is ready to do it yet.  He has been working from home recently, which has fortunately  freed up time to smoke more.  He thinks he was also given samples of pancreatic enzyme replacement therapy which she thought helped.  He does not remember if it was Creon or Zenpep   EGD April 05, 2021 (Dr. Tomasa Rand) Indications: Epigastric pain, weight loss Findings: Single island of salmon-colored mucosa at 38 cm, biopsied otherwise normal esophagus (bx neg for IM) Diffuse granular mucosa in the stomach, biopsied (reactive, no H. Pylori) Normal duodenum  Colonoscopy April 05, 2021 (Dr. Tomasa Rand) Indications: Abdominal pain, weight loss Findings: A few diverticula in the ascending colon 3 mm polyp in the cecum (tubular adenoma) Localized mild inflammation in the rectum, biopsied (reactive, no chronic inflammation, most likely prep related) Normal terminal ileum Hypertrophied anal papilla   CT Chest, December 07, 2020 Indication: Weight loss IMPRESSION: No acute cardiopulmonary disease.   CT, abdomen/pelvis with contrast Aug 09, 2022 Indication: Abdominal pain, weight loss IMPRESSION: 1. Findings of chronic pancreatitis with multifocal hypodense areas along the course of the pancreas measuring up to 2.2 cm, these are not clearly fluid density and may reflect areas of prior necrosis/fatty replacement. However, while not distinctly favored given the lack of interval increased ductal dilation a pancreatic neoplastic process is not excluded. Suggest further evaluation by EUS/ERCP. 2. Small amount of peripancreatic fluid, which may be reactive or reflect acute pancreatitis. 3. 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 4. 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 5. Questionable rectal wall thickening suggest correlation with direct visualization. 6. Small bowel small bowel intussusception in the midabdomen, nonspecific finding which is commonly  transient   CT abdomen/pelvis with contrast June 20, 2020 Indication: Abdominal pain, right groin bulge, suspected hernia IMPRESSION: 1. New indeterminate low-density right inguinal mass, without definite communication with the peritoneal cavity or associated distal small bowel obstruction. The administered enteric contrast has not passed beyond this level into the terminal ileum or colon. This does not have the typical appearance of an inguinal hernia containing small bowel and may reflect a loculated hydrocele or other atypical fluid collection. Consider further evaluation with ultrasound. Clinical follow-up necessary to exclude atypical incarcerated hernia. 2. 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 3. These results will be called to the ordering clinician or representative by the Radiology Department at the imaging location.  Past Medical History:  Diagnosis Date   Allergy    Dermatitis 07/31/2020   Enlarged lymph nodes 05/23/2020   Generalized abdominal pain 07/31/2020   History of pancreatitis 06/01/2018   Hypertension    Loss of weight 05/23/2020   Pancreatitis    Right groin mass 05/23/2020     Past Surgical History:  Procedure Laterality Date   HERNIA REPAIR     NASAL FRACTURE SURGERY     TYMPANOSTOMY TUBE PLACEMENT Bilateral    Family History  Problem Relation Age of Onset   Hypertension Father    Diabetes Father    Other Sister        Leg Issues   Depression Sister    Asthma Daughter    Asthma Son    Hypertension Paternal Grandmother    Colon cancer Neg Hx    Pancreatic cancer Neg Hx    Esophageal cancer Neg Hx    Liver cancer Neg Hx    Stomach cancer Neg Hx    Rectal cancer Neg Hx    Social History   Tobacco Use   Smoking status: Every Day    Types: Cigars   Smokeless tobacco: Never   Tobacco comments:    Smokes the "little" cigars that look like cigarettes  Vaping Use   Vaping status: Never Used  Substance Use Topics    Alcohol use: Not Currently    Alcohol/week: 12.0 - 24.0 standard drinks of alcohol    Types: 12 - 24 Shots of liquor per week    Comment: daily; (08/22/11-reports drinking a fifth in last week, had not drank since getting out of hospital in april   Drug use: Yes    Types: Marijuana   Current Outpatient Medications  Medication Sig Dispense Refill   diltiazem 2 % GEL Apply 1 Application topically 3 (three) times daily. 30 g 1   methocarbamol (ROBAXIN) 500 MG tablet Take 1 tablet (500 mg total) by mouth 4 (four) times daily. 60 tablet 0   pantoprazole (PROTONIX) 40 MG tablet TAKE 1 TABLET(40 MG) BY MOUTH DAILY 90 tablet 3   Plecanatide (TRULANCE) 3 MG TABS Take 1 tablet (3 mg total) by mouth daily. 90 tablet 0   pregabalin (LYRICA) 50 MG capsule TAKE 1 CAPSULE(50 MG) BY MOUTH THREE TIMES DAILY 90 capsule 3   senna (SENOKOT) 8.6 MG TABS tablet TAKE 1 TABLET BY MOUTH DAILY AS NEEDED FOR MILD CONSTIPATION 120 tablet 0   No current facility-administered medications for this visit.   No Known Allergies  Review of Systems: All systems reviewed and negative except where noted in HPI.    No results found.  Physical Exam: BP (!) 120/90   Pulse 68   Ht 5\' 10"  (1.778 m)   Wt 116 lb (52.6 kg)   BMI 16.64 kg/m  Constitutional: Pleasant, thin, African-American male in no acute distress. HEENT: Normocephalic and atraumatic. Conjunctivae are normal. No scleral icterus. Neck supple.  Cardiovascular: Normal rate, regular rhythm.  Pulmonary/chest: Effort normal and breath sounds normal. No wheezing, rales or rhonchi. Abdominal: Soft, nondistended, nontender. Bowel sounds active throughout. There are no masses palpable. No hepatomegaly. Neurological: Alert and oriented to person place and time. Skin: Skin is warm and dry. No rashes noted. Psychiatric: Normal mood and affect. Behavior is normal.  CBC    Component Value Date/Time   WBC 7.5 07/08/2022 1236   RBC 4.42 07/08/2022 1236   HGB  14.6 07/08/2022 1236   HGB 14.1 05/23/2020 0926   HCT 42.2 07/08/2022 1236   HCT 40.2 05/23/2020 0926   PLT 587.0 (H) 07/08/2022 1236   PLT 464 (H) 05/23/2020 0926   MCV 95.5 07/08/2022 1236   MCV 91 05/23/2020 0926   MCH 32.9 04/23/2022 1626   MCHC 34.7 07/08/2022 1236   RDW 14.6 07/08/2022 1236   RDW 12.3 05/23/2020 0926   LYMPHSABS 2.3 07/08/2022 1236   LYMPHSABS 2.7 05/23/2020 0926   MONOABS 0.4 07/08/2022 1236   EOSABS 0.1 07/08/2022 1236   EOSABS 0.4 05/23/2020 0926   BASOSABS 0.0 07/08/2022 1236   BASOSABS 0.1 05/23/2020 0926    CMP     Component Value Date/Time   NA 137 07/08/2022 1236   NA 143 05/23/2020 0926   K 3.9 07/08/2022 1236   CL 101 07/08/2022 1236   CO2 29 07/08/2022 1236   GLUCOSE 102 (H) 07/08/2022 1236   BUN 36 (H) 07/08/2022 1236   BUN 24 05/23/2020 0926   CREATININE 1.43 07/08/2022 1236   CREATININE 0.92 06/01/2018 1222   CALCIUM 9.8 07/08/2022 1236   PROT 7.7 07/08/2022 1236   PROT 6.6 05/23/2020 0926   ALBUMIN 4.2 07/08/2022 1236   ALBUMIN 4.1 05/23/2020 0926   AST 11 07/08/2022 1236   ALT 10 07/08/2022 1236   ALKPHOS 69 07/08/2022 1236   BILITOT 0.3 07/08/2022 1236   BILITOT 0.2 05/23/2020 0926   GFRNONAA >60 04/23/2022 1626   GFRNONAA 102 06/01/2018 1222   GFRAA >60 10/04/2019 2036   GFRAA 118 06/01/2018 1222       Latest Ref Rng & Units 07/08/2022   12:36 PM 04/23/2022    4:26 PM 04/08/2022   12:03 PM  CBC EXTENDED  WBC 4.0 - 10.5 K/uL 7.5  6.7  6.6   RBC 4.22 - 5.81 Mil/uL 4.42  4.25  4.23   Hemoglobin 13.0 - 17.0 g/dL 16.1  09.6  04.5   HCT 39.0 - 52.0 % 42.2  38.3  39.2   Platelets 150.0 - 400.0 K/uL 587.0  543  537   NEUT# 1.4 - 7.7 K/uL 4.6  3.8  4.1   Lymph# 0.7 - 4.0 K/uL 2.3  2.2  2.0       ASSESSMENT AND PLAN: 47 year old male with history of chronic alcohol abuse and tobacco use with chronic pancreatitis.  His weight had decreased to 98 pounds at his last visit in April, and a CT scan showed evidence of chronic  pancreatitis as well as areas of ductal dilation and multiple hypodensities.  A splenic vein thrombosis was  also noted with multiple collaterals.  A CA 19-9 was not elevated.  He is scheduled for an endoscopic ultrasound with Dr. Meridee Score next month.  Since his last visit, he has gained almost 20 pounds and he is feeling better.  It is very unlikely that the hypodensities and ductal dilation seen on his CT scan are related to malignancy, but certainly reasonable to continue with endoscopic ultrasound. I congratulated him on stopping drinking, but we discussed how his next goal needs to be complete tobacco cessation.  We discussed how ongoing tobacco use in the setting of chronic pancreatitis will cause continued damage to his pancreas and increased risk for pancreatic cancer.  He feels that the Lyrica is helping his pain.  He is on a fairly low-dose and still has issues with pain, so recommended we increase his dose to 100 mg 3 times daily. Will also place a prescription for Creon.  Even though patient not having any stearrhea, given his low weight and ongoing pain, I am hopeful Creon will provide some benefit.  Chronic pancreatitis/abnormal appearing pancreas - Increase Lyrica to 100 mg 3 times daily - Start Creon 36,000 lipase units with meals and snacks - Tobacco cessation counseling -Continue complete alcohol abstinence - Proceed with EUS next month - Continue current diet with daily protein shakes, avoid fried foods/fatty foods  Constipation - Trulance 3 mg daily (already prescribed)  Splenic vein thrombosis, chronic - No indication for anticoagulation given chronicity/presence of collaterals/lack of symptoms  Zachary Harper E. Tomasa Rand, MD [facility]   Dulce Sellar, NP

## 2022-10-04 ENCOUNTER — Telehealth: Payer: Self-pay | Admitting: Pharmacy Technician

## 2022-10-04 ENCOUNTER — Other Ambulatory Visit (HOSPITAL_COMMUNITY): Payer: Self-pay

## 2022-10-04 NOTE — Telephone Encounter (Signed)
Pharmacy Patient Advocate Encounter   Received notification from CoverMyMeds that prior authorization for CREON 36000 is required/requested.   Insurance verification completed.   The patient is insured through [facility] .   Per test claim: PA submitted to [facility] via Prompt PA Key/confirmation #/[id number] Status is pending

## 2022-10-04 NOTE — Telephone Encounter (Signed)
Pharmacy Patient Advocate Encounter   Received notification from CoverMyMeds that prior authorization for TRULANCE 3MG  is required/requested.   Insurance verification completed.   The patient is insured through [employer] .   Per test claim: PA submitted to [employer] via Prompt PA Key/confirmation #/[id number] Status is pending

## 2022-10-07 ENCOUNTER — Other Ambulatory Visit (HOSPITAL_COMMUNITY): Payer: Self-pay

## 2022-10-07 NOTE — Telephone Encounter (Signed)
Pharmacy Patient Advocate Encounter  Received notification from [employer] that Prior Authorization for TRULANCE 3MG  has been CANCELLED due to;  PA #/Case ID/Reference #: -

## 2022-10-07 NOTE — Telephone Encounter (Signed)
Pharmacy Patient Advocate Encounter  Received notification from [employer] that Prior Authorization for CREON 36000 has been APPROVED from 7.22.24 to 12.31.2099. Ran test claim, Copay is $-. UNABLE TO PROVIDE PRICE. ALREADY FILLED AT OTHER PHARMACY.   PA #/Case ID/Reference #: [id number]

## 2022-10-07 NOTE — Telephone Encounter (Signed)
PA has been submitted and approved.  

## 2022-10-08 NOTE — Telephone Encounter (Signed)
Pt made aware. Phone number provided for pt. Pt verbalized understanding with all questions answered.

## 2022-10-13 ENCOUNTER — Other Ambulatory Visit: Payer: Self-pay

## 2022-10-13 ENCOUNTER — Telehealth: Payer: Self-pay | Admitting: Gastroenterology

## 2022-10-13 MED ORDER — PANCRELIPASE (LIP-PROT-AMYL) 36000-114000 UNITS PO CPEP: 36000.0000 [IU] | ORAL_CAPSULE | Freq: Three times a day (TID) | ORAL | 3 refills | Status: DC

## 2022-10-13 NOTE — Telephone Encounter (Signed)
Refill sent to patients pharmacy. 

## 2022-10-13 NOTE — Telephone Encounter (Signed)
Inbound call from patient requesting a refill for Creon.Please advise

## 2022-11-03 ENCOUNTER — Encounter (HOSPITAL_COMMUNITY): Payer: Self-pay | Admitting: Gastroenterology

## 2022-11-03 NOTE — Progress Notes (Signed)
Attempted to obtain medical history via telephone, unable to reach at this time. HIPAA compliant voicemail message left requesting return call to pre surgical testing department. 

## 2022-11-10 ENCOUNTER — Ambulatory Visit (HOSPITAL_COMMUNITY): Payer: Managed Care, Other (non HMO)

## 2022-11-10 ENCOUNTER — Encounter (HOSPITAL_COMMUNITY): Payer: Self-pay | Admitting: Gastroenterology

## 2022-11-10 ENCOUNTER — Ambulatory Visit (HOSPITAL_COMMUNITY)
Admission: RE | Admit: 2022-11-10 | Discharge: 2022-11-10 | Disposition: A | Discharge status: Home / Self Care | Payer: Managed Care, Other (non HMO) | Source: Ambulatory Visit | Attending: Gastroenterology | Admitting: Gastroenterology

## 2022-11-10 ENCOUNTER — Other Ambulatory Visit: Payer: Self-pay

## 2022-11-10 ENCOUNTER — Ambulatory Visit (HOSPITAL_BASED_OUTPATIENT_CLINIC_OR_DEPARTMENT_OTHER): Payer: Managed Care, Other (non HMO)

## 2022-11-10 ENCOUNTER — Encounter (HOSPITAL_COMMUNITY)
Admission: RE | Disposition: A | Discharge status: Home / Self Care | Payer: Self-pay | Source: Ambulatory Visit | Attending: Gastroenterology

## 2022-11-10 DIAGNOSIS — K449 Diaphragmatic hernia without obstruction or gangrene: Secondary | ICD-10-CM | POA: Insufficient documentation

## 2022-11-10 DIAGNOSIS — K861 Other chronic pancreatitis: Secondary | ICD-10-CM | POA: Diagnosis not present

## 2022-11-10 DIAGNOSIS — K862 Cyst of pancreas: Secondary | ICD-10-CM | POA: Insufficient documentation

## 2022-11-10 DIAGNOSIS — K3189 Other diseases of stomach and duodenum: Secondary | ICD-10-CM | POA: Diagnosis not present

## 2022-11-10 DIAGNOSIS — I899 Noninfective disorder of lymphatic vessels and lymph nodes, unspecified: Secondary | ICD-10-CM

## 2022-11-10 DIAGNOSIS — I10 Essential (primary) hypertension: Secondary | ICD-10-CM | POA: Diagnosis not present

## 2022-11-10 DIAGNOSIS — F1290 Cannabis use, unspecified, uncomplicated: Secondary | ICD-10-CM | POA: Insufficient documentation

## 2022-11-10 DIAGNOSIS — F17290 Nicotine dependence, other tobacco product, uncomplicated: Secondary | ICD-10-CM | POA: Insufficient documentation

## 2022-11-10 DIAGNOSIS — R1013 Epigastric pain: Secondary | ICD-10-CM

## 2022-11-10 DIAGNOSIS — K8689 Other specified diseases of pancreas: Secondary | ICD-10-CM

## 2022-11-10 DIAGNOSIS — R634 Abnormal weight loss: Secondary | ICD-10-CM

## 2022-11-10 DIAGNOSIS — K2289 Other specified disease of esophagus: Secondary | ICD-10-CM | POA: Diagnosis not present

## 2022-11-10 DIAGNOSIS — K2980 Duodenitis without bleeding: Secondary | ICD-10-CM | POA: Insufficient documentation

## 2022-11-10 HISTORY — PX: EUS: SHX5427

## 2022-11-10 HISTORY — PX: BIOPSY: SHX5522

## 2022-11-10 HISTORY — PX: ESOPHAGOGASTRODUODENOSCOPY: SHX5428

## 2022-11-10 SURGERY — UPPER ENDOSCOPIC ULTRASOUND (EUS) RADIAL
Anesthesia: Monitor Anesthesia Care

## 2022-11-10 MED ORDER — PROPOFOL 500 MG/50ML IV EMUL
INTRAVENOUS | Status: DC | PRN
  Administered 2022-11-10: 130 ug/kg/min via INTRAVENOUS

## 2022-11-10 MED ORDER — LACTATED RINGERS IV SOLN
INTRAVENOUS | Status: DC
  Administered 2022-11-10: 1000 mL via INTRAVENOUS

## 2022-11-10 MED ORDER — LIDOCAINE 2% (20 MG/ML) 5 ML SYRINGE
INTRAMUSCULAR | Status: DC | PRN
  Administered 2022-11-10: 60 mg via INTRAVENOUS

## 2022-11-10 MED ORDER — PROPOFOL 10 MG/ML IV BOLUS
INTRAVENOUS | Status: DC | PRN
  Administered 2022-11-10: 100 mg via INTRAVENOUS
  Administered 2022-11-10: 20 mg via INTRAVENOUS
  Administered 2022-11-10: 30 mg via INTRAVENOUS

## 2022-11-10 MED ORDER — PROPOFOL 500 MG/50ML IV EMUL
INTRAVENOUS | Status: AC
  Filled 2022-11-10: qty 50

## 2022-11-10 MED ORDER — PROPOFOL 10 MG/ML IV BOLUS
INTRAVENOUS | Status: AC
  Filled 2022-11-10: qty 20

## 2022-11-10 MED ORDER — SODIUM CHLORIDE 0.9 % IV SOLN: INTRAVENOUS | Status: DC

## 2022-11-10 NOTE — Anesthesia Preprocedure Evaluation (Addendum)
Anesthesia Evaluation  Patient identified by MRN, date of birth, ID band Patient awake    Reviewed: Allergy & Precautions, NPO status , Patient's Chart, lab work & pertinent test results  Airway Mallampati: I       Dental no notable dental hx.    Pulmonary Current Smoker   Pulmonary exam normal        Cardiovascular hypertension, Normal cardiovascular exam     Neuro/Psych negative neurological ROS  negative psych ROS   GI/Hepatic negative GI ROS, Neg liver ROS,,,  Endo/Other  negative endocrine ROS    Renal/GU negative Renal ROS  negative genitourinary   Musculoskeletal negative musculoskeletal ROS (+)    Abdominal Normal abdominal exam  (+)   Peds  Hematology negative hematology ROS (+)   Anesthesia Other Findings   Reproductive/Obstetrics                             Anesthesia Physical Anesthesia Plan  ASA: 2  Anesthesia Plan: MAC   Post-op Pain Management:    Induction:   PONV Risk Score and Plan: Propofol infusion  Airway Management Planned: Natural Airway and Mask  Additional Equipment: None  Intra-op Plan:   Post-operative Plan:   Informed Consent: I have reviewed the patients History and Physical, chart, labs and discussed the procedure including the risks, benefits and alternatives for the proposed anesthesia with the patient or authorized representative who has indicated his/her understanding and acceptance.       Plan Discussed with:   Anesthesia Plan Comments:         Anesthesia Quick Evaluation

## 2022-11-10 NOTE — Discharge Instructions (Signed)

## 2022-11-10 NOTE — Anesthesia Postprocedure Evaluation (Signed)
Anesthesia Post Note  Patient: Zachary Harper.  Procedure(s) Performed: UPPER ENDOSCOPIC ULTRASOUND (EUS) RADIAL ESOPHAGOGASTRODUODENOSCOPY (EGD) BIOPSY     Patient location during evaluation: Endoscopy Anesthesia Type: MAC Level of consciousness: awake Pain management: pain level controlled Vital Signs Assessment: post-procedure vital signs reviewed and stable Cardiovascular status: stable Postop Assessment: no apparent nausea or vomiting Anesthetic complications: no  No notable events documented.  Last Vitals:  Vitals:   11/10/22 1330 11/10/22 1340  BP: 123/79 (!) 163/110  Pulse: 67 73  Resp: 12 15  Temp:    SpO2: 100% 100%    Last Pain:  Vitals:   11/10/22 1340  TempSrc:   PainSc: 0-No pain                 John F Scharlene Corn

## 2022-11-10 NOTE — Progress Notes (Signed)
Notified Dr Arby Barrette that pt's BP elevated in post op 179/116, pt's pre-op BP 158/109, md states pt is okay for discharge

## 2022-11-10 NOTE — Op Note (Signed)
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 Tomasa Rand, MD, Quentin Mulling, Stephanie                            [place] Medicines:                Monitored Anesthesia Care Complications:            No immediate complications. Estimated Blood Loss:     Estimated blood loss was minimal. Procedure:                Pre-Anesthesia Assessment:                           - Prior to the procedure, a History and Physical                            was performed, and patient medications and                            allergies were reviewed. The patient's tolerance of                            previous anesthesia was also reviewed. The risks                            and benefits of the procedure and the sedation                            options and risks were discussed with the patient.                            All questions were answered, and informed consent                            was obtained. Prior Anticoagulants: The patient has                            taken no anticoagulant or antiplatelet agents. ASA                            Grade Assessment: III - A patient with severe  systemic disease. After reviewing the risks and                            benefits, the patient was deemed in satisfactory                            condition to undergo the procedure.                           After obtaining informed consent, the endoscope was                            passed under direct vision. Throughout the                             procedure, the patient's blood pressure, pulse, and                            oxygen saturations were monitored continuously. The                            [id number]-[id number] ([id number] Olympus endoscope was introduced                            through the mouth, and advanced to the second part                            of duodenum. The [id number] ([id number]) Olympus linear                            ultrasound scope was introduced through the mouth,                            and advanced to the duodenum for ultrasound                            examination from the stomach and duodenum. The                            upper EUS was accomplished without difficulty. The                            patient tolerated the procedure. Scope In: Scope Out: Findings:      ENDOSCOPIC FINDING: :      No gross lesions were noted in the entire esophagus.      The Z-line was irregular and was found 40 cm from the incisors.      A 2 cm hiatal hernia was present.      Patchy mildly erythematous mucosa was found in the entire examined       stomach. Biopsies were taken with a cold forceps for histology and       Helicobacter pylori testing.      Localized moderately erythematous mucosa without active bleeding was       found in the duodenal bulb. Biopsies were  taken with a cold forceps for       histology.      No other gross lesions were noted in the second portion of the duodenum.      The major papilla was normal in endoscopic appearance.      ENDOSONOGRAPHIC FINDING: :      Pancreatic parenchymal abnormalities were noted in the entire pancreas.       These consisted of atrophy, diffusely increased echogenicity, cysts,       hyperechoic foci with shadowing and lobularity without honeycombing. No       evidence of any overt mass or lesion was found today (however as a       result of the significant chronic pancreatitis changes, sensitivity of       endoscopic ultrasound for findings of mass/lesions is  decreased)      There was no sign of significant endosonographic abnormality in the       common bile duct (4.8 mm) and some slight prominence of the common       hepatic duct (8.1 mm). I do not see an overt/significant stricture       within the head of the pancreas, but I am concerned that he may have a       possible inflammatory narrowing due to the significant chronic       pancreatitis changes in his head.      Endosonographic imaging of the ampulla showed no intramural       (subepithelial) lesion.      Endosonographic imaging in the visualized portion of the liver showed no       mass.      No malignant-appearing lymph nodes were visualized in the celiac region       (level 20), peripancreatic region and porta hepatis region.      The celiac region was visualized. Impression:               EGD impression:                           - No gross lesions in the entire esophagus. Z-line                            irregular, 40 cm from the incisors.                           - 2 cm hiatal hernia.                           - Erythematous mucosa in the stomach. Biopsied.                           - Erythematous duodenopathy in the bulb. Biopsied.                           - No other gross lesions in the second portion of                            the duodenum.                           - Normal major papilla.  EUS impression:                           - Pancreatic parenchymal abnormalities consisting                            of atrophy, diffusely increased echogenicity,                            cysts, hyperechoic foci and lobularity were noted                            in the entire pancreas. Patient meets [place]                            criteria for chronic pancreatitis changes. Within                            the head of the pancreas, there is significant                            calcifications. No evidence of a mass or lesion                             were found throughout the pancreas. Important to                            note decreased sensitivity in these patients for                            findings of mass/lesions due to the shadowing                            effect of the calcifications.                           - There was no sign of significant pathology in the                            common bile duct and the common hepatic duct had                            slight prominence. I am suspicious that patient may                            have an inflammatory narrowing of the distal common                            bile duct in the area of significant                            calcifications. However he has no evidence of  significant LFT abnormalities at this time. Will                            need to be monitored if he has evidence of                            recurrent acute episodes of pancreatitis.                           - No malignant-appearing lymph nodes were                            visualized in the celiac region (level 20),                            peripancreatic region and porta hepatis region. Moderate Sedation:      Not Applicable - Patient had care per Anesthesia. Recommendation:           - The patient will be observed post-procedure,                            until all discharge criteria are met.                           - The patient will be observed post-procedure,                            until all discharge criteria are met.                           - Discharge patient to home.                           - Patient has a contact number available for                            emergencies. The signs and symptoms of potential                            delayed complications were discussed with the                            patient. Return to normal activities tomorrow.                            Written discharge instructions were provided to the                             patient.                           - Low fat diet.                           - Observe patient's clinical course.                           -  Consider repeat imaging with pancreas protocol CT                            abdomen versus MRI/MRCP in 1 year (pending no other                            significant abnormalities or changes).                           - If patient has another bout of pancreatitis, he                            could develop LFT abnormalities and what appears to                            be a possible inflammatory narrowing in the head of                            the pancreas. That would be a reason for                            consideration of biliary ERCP. Based on the s                            significance of the patient's current pancreatic                            disease it is not clear that the patient will                            benefit from pancreatic ERCP unless something else                            develops or changes.                           - The findings and recommendations were discussed                            with the patient. Procedure Code(s):        --- Professional ---                           [phone], Esophagogastroduodenoscopy, flexible,                            transoral; with endoscopic ultrasound examination                            limited to the esophagus, stomach or duodenum, and                            adjacent structures  16109, Esophagogastroduodenoscopy, flexible,                            transoral; with biopsy, single or multiple Diagnosis Code(s):        --- Professional ---                           K22.89, Other specified disease of esophagus                           K44.9, Diaphragmatic hernia without obstruction or                            gangrene                           K31.89, Other diseases of stomach and duodenum                            K86.2, Cyst of pancreas                           K86.9, Disease of pancreas, unspecified                           I89.9, Noninfective disorder of lymphatic vessels                            and lymph nodes, unspecified                           K86.89, Other specified diseases of pancreas CPT copyright 2022 American Medical Association. All rights reserved. The codes documented in this report are preliminary and upon coder review may  be revised to meet current compliance requirements. Corliss Parish, MD 11/10/2022 1:35:53 PM Number of Addenda: 0

## 2022-11-10 NOTE — H&P (Signed)
GASTROENTEROLOGY PROCEDURE H&P NOTE   Primary Care Physician: Dulce Sellar, NP  HPI: Zachary Harper. is a 47 y.o. male who presents for EGD/EUS to evaluate dilated pancreatic duct, pancreatic cyst, atrophy, chronic pancreatitis.  Past Medical History:  Diagnosis Date   Allergy    Dermatitis 07/31/2020   Enlarged lymph nodes 05/23/2020   Generalized abdominal pain 07/31/2020   History of pancreatitis 06/01/2018   Hypertension    Loss of weight 05/23/2020   Pancreatitis    Right groin mass 05/23/2020   Past Surgical History:  Procedure Laterality Date   HERNIA REPAIR     NASAL FRACTURE SURGERY     TYMPANOSTOMY TUBE PLACEMENT Bilateral    Current Facility-Administered Medications  Medication Dose Route Frequency Provider Last Rate Last Admin   0.9 %  sodium chloride infusion   Intravenous Continuous Mansouraty, Netty Starring., MD       lactated ringers infusion   Intravenous Continuous Mansouraty, Netty Starring., MD 10 mL/hr at 11/10/22 1117 1,000 mL at 11/10/22 1117    Current Facility-Administered Medications:    0.9 %  sodium chloride infusion, , Intravenous, Continuous, Mansouraty, Netty Starring., MD   lactated ringers infusion, , Intravenous, Continuous, Mansouraty, Netty Starring., MD, Last Rate: 10 mL/hr at 11/10/22 1117, 1,000 mL at 11/10/22 1117 No Known Allergies Family History  Problem Relation Age of Onset   Hypertension Father    Diabetes Father    Other Sister        Leg Issues   Depression Sister    Asthma Daughter    Asthma Son    Hypertension Paternal Grandmother    Colon cancer Neg Hx    Pancreatic cancer Neg Hx    Esophageal cancer Neg Hx    Liver cancer Neg Hx    Stomach cancer Neg Hx    Rectal cancer Neg Hx    Social History   Socioeconomic History   Marital status: Single    Spouse name: Not on file   Number of children: 2   Years of education: Not on file   Highest education level: Bachelor's degree (e.g., BA, AB, BS)  Occupational  History   Occupation: Unemployed  Tobacco Use   Smoking status: Every Day    Types: Cigars   Smokeless tobacco: Never   Tobacco comments:    Smokes the "little" cigars that look like cigarettes  Vaping Use   Vaping status: Never Used  Substance and Sexual Activity   Alcohol use: Not Currently    Alcohol/week: 12.0 - 24.0 standard drinks of alcohol    Types: 12 - 24 Shots of liquor per week    Comment: daily; (08/22/11-reports drinking a fifth in last week, had not drank since getting out of hospital in april   Drug use: Yes    Types: Marijuana   Sexual activity: Not Currently    Partners: Female    Comment: Last intercourse was 2 years ago  Other Topics Concern   Not on file  Social History Narrative   Not on file   Social Determinants of Health   Financial Resource Strain: High Risk (06/01/2018)   Overall Financial Resource Strain (CARDIA)    Difficulty of Paying Living Expenses: Very hard  Food Insecurity: Food Insecurity Present (06/01/2018)   Hunger Vital Sign    Worried About Running Out of Food in the Last Year: Often true    Ran Out of Food in the Last Year: Often true  Transportation Needs: Unmet Transportation Needs (  06/01/2018)   PRAPARE - Transportation    Lack of Transportation (Medical): Yes    Lack of Transportation (Non-Medical): Yes  Physical Activity: Insufficiently Active (06/01/2018)   Exercise Vital Sign    Days of Exercise per Week: 3 days    Minutes of Exercise per Session: 10 min  Stress: No Stress Concern Present (06/01/2018)   [employer] of Occupational Health - Occupational Stress Questionnaire    Feeling of Stress : Not at all  Social Connections: Moderately Isolated (06/01/2018)   Social Connection and Isolation Panel [NHANES]    Frequency of Communication with Friends and Family: More than three times a week    Frequency of Social Gatherings with Friends and Family: More than three times a week    Attends Religious Services: Never     Database administrator or Organizations: No    Attends Banker Meetings: Never    Marital Status: Never married  Intimate Partner Violence: Not At Risk (06/01/2018)   Humiliation, Afraid, Rape, and Kick questionnaire    Fear of Current or Ex-Partner: No    Emotionally Abused: No    Physically Abused: No    Sexually Abused: No    Physical Exam: Today's Vitals   11/10/22 1102  BP: (!) 158/109  Pulse: 77  Resp: 13  Temp: 98.2 F (36.8 C)  TempSrc: Temporal  SpO2: 100%  Weight: 54.9 kg  Height: 5' 10.5" (1.791 m)  PainSc: 0-No pain   Body mass index is 17.12 kg/m. GEN: NAD EYE: Sclerae anicteric ENT: MMM CV: Non-tachycardic GI: Soft, NT/ND NEURO:  Alert & Oriented x 3  Lab Results: No results for input(s): "WBC", "HGB", "HCT", "PLT" in the last 72 hours. BMET No results for input(s): "NA", "K", "CL", "CO2", "GLUCOSE", "BUN", "CREATININE", "CALCIUM" in the last 72 hours. LFT No results for input(s): "PROT", "ALBUMIN", "AST", "ALT", "ALKPHOS", "BILITOT", "BILIDIR", "IBILI" in the last 72 hours. PT/INR No results for input(s): "LABPROT", "INR" in the last 72 hours.   Impression / Plan: This is a 47 y.o.male who presents for EGD/EUS to evaluate dilated pancreatic duct, pancreatic cyst, atrophy, chronic pancreatitis.  The risks of an EUS including intestinal perforation, bleeding, infection, aspiration, and medication effects were discussed as was the possibility it may not give a definitive diagnosis if a biopsy is performed.  When a biopsy of the pancreas is done as part of the EUS, there is an additional risk of pancreatitis at the rate of about 1-2%.  It was explained that procedure related pancreatitis is typically mild, although it can be severe and even life threatening, which is why we do not perform random pancreatic biopsies and only biopsy a lesion/area we feel is concerning enough to warrant the risk.  The risks and benefits of endoscopic  evaluation/treatment were discussed with the patient and/or family; these include but are not limited to the risk of perforation, infection, bleeding, missed lesions, lack of diagnosis, severe illness requiring hospitalization, as well as anesthesia and sedation related illnesses.  The patient's history has been reviewed, patient examined, no change in status, and deemed stable for procedure.  The patient and/or family is agreeable to proceed.    Corliss Parish, MD [facility] Office # [phone]

## 2022-11-10 NOTE — Anesthesia Procedure Notes (Signed)
Procedure Name: MAC Date/Time: 11/10/2022 12:56 PM  Performed by: Maurene Capes, CRNAPre-anesthesia Checklist: Patient identified, Emergency Drugs available, Suction available and Patient being monitored Patient Re-evaluated:Patient Re-evaluated prior to induction Oxygen Delivery Method: Simple face mask Induction Type: IV induction Placement Confirmation: positive ETCO2 Dental Injury: Teeth and Oropharynx as per pre-operative assessment

## 2022-11-10 NOTE — Transfer of Care (Signed)
Immediate Anesthesia Transfer of Care Note  Patient: Zachary Harper.  Procedure(s) Performed: UPPER ENDOSCOPIC ULTRASOUND (EUS) RADIAL ESOPHAGOGASTRODUODENOSCOPY (EGD) BIOPSY  Patient Location: PACU and Endoscopy Unit  Anesthesia Type:MAC  Level of Consciousness: sedated  Airway & Oxygen Therapy: Patient Spontanous Breathing and Patient connected to face mask oxygen  Post-op Assessment: Report given to RN and Post -op Vital signs reviewed and stable  Post vital signs: Reviewed and stable  Last Vitals:  Vitals Value Taken Time  BP 123/79 11/10/22 1330  Temp    Pulse 68 11/10/22 1331  Resp 14 11/10/22 1331  SpO2 100 % 11/10/22 1331  Vitals shown include unfiled device data.  Last Pain:  Vitals:   11/10/22 1102  TempSrc: Temporal  PainSc: 0-No pain         Complications: No notable events documented.

## 2022-11-11 ENCOUNTER — Encounter (HOSPITAL_COMMUNITY): Payer: Self-pay | Admitting: Gastroenterology

## 2022-11-12 LAB — SURGICAL PATHOLOGY

## 2022-11-13 ENCOUNTER — Encounter: Payer: Self-pay | Admitting: Gastroenterology

## 2023-04-17 ENCOUNTER — Other Ambulatory Visit: Payer: Self-pay | Admitting: Gastroenterology

## 2023-05-10 ENCOUNTER — Other Ambulatory Visit: Payer: Self-pay | Admitting: Gastroenterology

## 2023-05-10 DIAGNOSIS — R1084 Generalized abdominal pain: Secondary | ICD-10-CM

## 2023-09-15 ENCOUNTER — Other Ambulatory Visit: Payer: Self-pay

## 2023-09-15 MED ORDER — PREGABALIN 100 MG PO CAPS: 100.0000 mg | ORAL_CAPSULE | Freq: Three times a day (TID) | ORAL | 0 refills | Status: DC

## 2023-09-29 ENCOUNTER — Telehealth: Payer: Self-pay | Admitting: Family

## 2023-09-29 NOTE — Telephone Encounter (Signed)
 Copied from CRM [id number]. Topic: Appointments - Scheduling Inquiry for Clinic >> Sep 28, 2023  2:02 PM Sophia H wrote: Reason for CRM: Patient is calling in wondering if Dr. Jegede has a wait list and if so if he can be put on it? Please advise, I told him we don't book under Dr. Jegede as his books are not opened at this time but he requested I send a message to office as well. # [phone]

## 2023-11-02 ENCOUNTER — Other Ambulatory Visit: Payer: Self-pay | Admitting: Gastroenterology

## 2023-11-13 ENCOUNTER — Ambulatory Visit: Payer: Self-pay | Admitting: Nurse Practitioner

## 2023-11-18 ENCOUNTER — Ambulatory Visit: Payer: Self-pay | Admitting: Nurse Practitioner

## 2023-11-25 ENCOUNTER — Other Ambulatory Visit: Payer: Self-pay | Admitting: Physician Assistant

## 2023-12-01 ENCOUNTER — Other Ambulatory Visit: Payer: Self-pay | Admitting: Physician Assistant

## 2023-12-18 ENCOUNTER — Other Ambulatory Visit: Payer: Self-pay | Admitting: Gastroenterology

## 2024-01-06 ENCOUNTER — Other Ambulatory Visit: Payer: Self-pay | Admitting: Family

## 2024-01-06 DIAGNOSIS — R2231 Localized swelling, mass and lump, right upper limb: Secondary | ICD-10-CM

## 2024-01-11 ENCOUNTER — Other Ambulatory Visit

## 2024-01-20 ENCOUNTER — Ambulatory Visit
Admission: RE | Admit: 2024-01-20 | Discharge: 2024-01-20 | Disposition: A | Discharge status: Home / Self Care | Source: Ambulatory Visit | Attending: Family | Admitting: Family

## 2024-01-20 DIAGNOSIS — R2231 Localized swelling, mass and lump, right upper limb: Secondary | ICD-10-CM

## 2024-02-07 ENCOUNTER — Other Ambulatory Visit: Payer: Self-pay | Admitting: Gastroenterology

## 2024-02-18 ENCOUNTER — Other Ambulatory Visit: Payer: Self-pay | Admitting: Physician Assistant

## 2024-03-16 ENCOUNTER — Other Ambulatory Visit: Payer: Self-pay | Admitting: Gastroenterology
